# Patient Record
Sex: Female | Born: 1960 | Race: White | Hispanic: No | Marital: Single
Health system: Midwestern US, Community
[De-identification: ages and names within clinical notes are randomized; demographics above are authoritative.]

## PROBLEM LIST (undated history)

## (undated) DIAGNOSIS — Z1231 Encounter for screening mammogram for malignant neoplasm of breast: Secondary | ICD-10-CM

## (undated) DIAGNOSIS — R059 Cough, unspecified: Secondary | ICD-10-CM

## (undated) DIAGNOSIS — K5900 Constipation, unspecified: Secondary | ICD-10-CM

## (undated) DIAGNOSIS — R0789 Other chest pain: Secondary | ICD-10-CM

## (undated) DIAGNOSIS — R251 Tremor, unspecified: Secondary | ICD-10-CM

## (undated) DIAGNOSIS — K2950 Unspecified chronic gastritis without bleeding: Secondary | ICD-10-CM

---

## 2010-02-15 ENCOUNTER — Inpatient Hospital Stay: Admit: 2010-02-15 | Disposition: A | Discharge status: Home / Self Care | Attending: Psychiatry | Admitting: Psychiatry

## 2010-02-15 LAB — BASIC METABOLIC PANEL
Anion Gap: 9 mmol/L (ref 8–16)
BUN: 13 mg/dL (ref 6–20)
CO2: 28 mmol/L (ref 20–31)
Calcium: 9.1 mg/dL (ref 8.6–10.4)
Chloride: 107 mmol/L (ref 98–110)
Creatinine: 0.53 mg/dL (ref 0.4–1.0)
GFR African American: >60 mL/min (ref >60)
GFR Non-African American: >60 mL/min (ref >60)
Glucose: 129 mg/dL — ABNORMAL HIGH (ref 74–106)
Potassium: 4.3 mmol/L (ref 3.5–5.1)
Sodium: 140 mmol/L (ref 136–145)

## 2010-02-15 LAB — CBC WITH DIFFERENTIAL
Absolute Baso #: 0 10*3/uL (ref 0.0–0.2)
Absolute Eos #: 0.06 10*3/uL (ref 0.0–0.4)
Absolute Lymph #: 1.32 10*3/uL (ref 1.0–4.8)
Absolute Mono #: 0.72 10*3/uL (ref 0.1–1.3)
Absolute Neut #: 3.9 10*3/uL (ref 1.3–9.1)
Basophils: 0 % (ref 0–2)
Eosinophils %: 1 % (ref 0–4)
Hematocrit: 34.2 % — ABNORMAL LOW (ref 36–46)
Hemoglobin: 11.5 g/dL — ABNORMAL LOW (ref 12.0–16.0)
Lymphocytes: 22 % — ABNORMAL LOW (ref 24–44)
MCH: 28 pg (ref 26–34)
MCHC: 33.6 g/dL (ref 31–37)
MCV: 83.3 fL (ref 80–100)
MPV: 8.2 fL (ref 6.0–12.0)
Monocytes: 12 % — ABNORMAL HIGH (ref 1–7)
Platelet Count: 271 10*3/uL (ref 150–450)
RBC: 4.1 m/uL (ref 4.0–5.2)
RDW: 20.1 % — ABNORMAL HIGH (ref 11.5–14.9)
Seg Neutrophils: 65 % (ref 36–66)
WBC: 6 10*3/uL (ref 3.5–11.0)

## 2010-02-15 LAB — POC GLUCOSE FINGERSTICK
Glucose, Whole Blood: 142 mg/dL — ABNORMAL HIGH (ref 65–105)
Glucose, Whole Blood: 95 mg/dL (ref 65–105)
Glucose, Whole Blood: 133 mg/dL — ABNORMAL HIGH (ref 65–105)

## 2010-02-16 LAB — POC GLUCOSE FINGERSTICK
Glucose, Whole Blood: 142 mg/dL — ABNORMAL HIGH (ref 65–105)
Glucose, Whole Blood: 151 mg/dL — ABNORMAL HIGH (ref 65–105)
Glucose, Whole Blood: 123 mg/dL — ABNORMAL HIGH (ref 65–105)
Glucose, Whole Blood: 153 mg/dL — ABNORMAL HIGH (ref 65–105)

## 2010-02-17 LAB — POC GLUCOSE FINGERSTICK
Glucose, Whole Blood: 148 mg/dL — ABNORMAL HIGH (ref 65–105)
Glucose, Whole Blood: 128 mg/dL — ABNORMAL HIGH (ref 65–105)
Glucose, Whole Blood: 65 mg/dL (ref 65–105)
Glucose, Whole Blood: 160 mg/dL — ABNORMAL HIGH (ref 65–105)

## 2010-02-18 LAB — POC GLUCOSE FINGERSTICK
Glucose, Whole Blood: 143 mg/dL — ABNORMAL HIGH (ref 65–105)
Glucose, Whole Blood: 143 mg/dL — ABNORMAL HIGH (ref 65–105)
Glucose, Whole Blood: 95 mg/dL (ref 65–105)
Glucose, Whole Blood: 202 mg/dL — ABNORMAL HIGH (ref 65–105)

## 2010-02-19 LAB — POC GLUCOSE FINGERSTICK
Glucose, Whole Blood: 122 mg/dL — ABNORMAL HIGH (ref 65–105)
Glucose, Whole Blood: 120 mg/dL — ABNORMAL HIGH (ref 65–105)
Glucose, Whole Blood: 86 mg/dL (ref 65–105)

## 2010-02-20 LAB — POC GLUCOSE FINGERSTICK
Glucose, Whole Blood: 142 mg/dL — ABNORMAL HIGH (ref 65–105)
Glucose, Whole Blood: 153 mg/dL — ABNORMAL HIGH (ref 65–105)
Glucose, Whole Blood: 78 mg/dL (ref 65–105)
Glucose, Whole Blood: 159 mg/dL — ABNORMAL HIGH (ref 65–105)

## 2010-02-21 LAB — POC GLUCOSE FINGERSTICK
Glucose, Whole Blood: 112 mg/dL — ABNORMAL HIGH (ref 65–105)
Glucose, Whole Blood: 127 mg/dL — ABNORMAL HIGH (ref 65–105)
Glucose, Whole Blood: 56 mg/dL — ABNORMAL LOW (ref 65–105)
Glucose, Whole Blood: 124 mg/dL — ABNORMAL HIGH (ref 65–105)
Glucose, Whole Blood: 138 mg/dL — ABNORMAL HIGH (ref 65–105)

## 2010-02-22 LAB — POC GLUCOSE FINGERSTICK
Glucose, Whole Blood: 125 mg/dL — ABNORMAL HIGH (ref 65–105)
Glucose, Whole Blood: 153 mg/dL — ABNORMAL HIGH (ref 65–105)
Glucose, Whole Blood: 161 mg/dL — ABNORMAL HIGH (ref 65–105)

## 2010-02-23 LAB — POC GLUCOSE FINGERSTICK
Glucose, Whole Blood: 109 mg/dL — ABNORMAL HIGH (ref 65–105)
Glucose, Whole Blood: 80 mg/dL (ref 65–105)
Glucose, Whole Blood: 98 mg/dL (ref 65–105)
Glucose, Whole Blood: 58 mg/dL — ABNORMAL LOW (ref 65–105)
Glucose, Whole Blood: 112 mg/dL — ABNORMAL HIGH (ref 65–105)

## 2010-03-01 ENCOUNTER — Inpatient Hospital Stay: Admit: 2010-03-01 | Disposition: A | Discharge status: Home / Self Care | Attending: Psychiatry | Admitting: Psychiatry

## 2010-03-01 LAB — DRUGS OF ABUSE SCR, URINE
Amphetamine Screen, Ur: NEGATIVE
Barbiturate Screen, Ur: NEGATIVE
Benzodiazepines: NEGATIVE
Cannabinoid Scrn, Ur: NEGATIVE
Cocaine Metabolite, Urine: NEGATIVE
Methadone Screen, Urine: NEGATIVE
Opiates, Urine: NEGATIVE
Phencyclidine, Urine: NEGATIVE
Propoxyphene, Urine: NEGATIVE

## 2010-03-01 LAB — URINALYSIS
Bilirubin, Urine: NEGATIVE
Glucose, UA: NEGATIVE
Ketones, Urine: NEGATIVE
Leukocyte Esterase, Urine: NEGATIVE
Nitrite, Urine: NEGATIVE
Protein, UA: NEGATIVE
Specific Gravity, UA: 1.014 (ref 1.005–1.030)
Urine Hgb: NEGATIVE
Urobilinogen, Urine: NORMAL
pH, UA: 8 (ref 5.0–8.0)

## 2010-03-01 LAB — MICROSCOPIC URINALYSIS: WBC, UA: 0 /HPF (ref 0–5)

## 2010-03-01 LAB — CBC WITH DIFFERENTIAL
Absolute Baso #: 0.07 10*3/uL (ref 0.0–0.2)
Absolute Eos #: 0.07 10*3/uL (ref 0.0–0.4)
Absolute Lymph #: 1.05 10*3/uL (ref 1.0–4.8)
Absolute Mono #: 0.42 10*3/uL (ref 0.1–0.8)
Absolute Neut #: 5.39 10*3/uL (ref 1.8–7.7)
Basophils: 1 % (ref 0–2)
Eosinophils %: 1 % (ref 1–4)
Hematocrit: 32.9 % — ABNORMAL LOW (ref 36–46)
Hemoglobin: 10.9 g/dL — ABNORMAL LOW (ref 12.0–16.0)
Lymphocytes: 15 % — ABNORMAL LOW (ref 24–44)
MCH: 28 pg (ref 26–34)
MCHC: 33.1 g/dL (ref 31–37)
MCV: 84.5 fL (ref 80–100)
MPV: 8.3 fL (ref 6.0–12.0)
Monocytes: 6 % (ref 1–7)
Platelet Count: 295 10*3/uL (ref 140–450)
RBC: 3.89 m/uL — ABNORMAL LOW (ref 4.0–5.2)
RDW: 22 % — ABNORMAL HIGH (ref 12.5–15.4)
Seg Neutrophils: 77 % — ABNORMAL HIGH (ref 36–66)
WBC: 7 10*3/uL (ref 3.5–11.0)

## 2010-03-01 LAB — BASIC METABOLIC PANEL
Anion Gap: 11 mmol/L (ref 8–16)
BUN: 12 mg/dL (ref 6–20)
CO2: 27 mmol/L (ref 20–31)
Calcium: 8.9 mg/dL (ref 8.6–10.4)
Chloride: 105 mmol/L (ref 98–110)
Creatinine: 0.45 mg/dL (ref 0.4–1.0)
GFR African American: >60 mL/min (ref >60)
GFR Non-African American: >60 mL/min (ref >60)
Glucose: 131 mg/dL — ABNORMAL HIGH (ref 74–106)
Potassium: 4.4 mmol/L (ref 3.5–5.1)
Sodium: 139 mmol/L (ref 136–145)

## 2010-03-01 LAB — POC GLUCOSE FINGERSTICK
Glucose, Whole Blood: 155 mg/dL — ABNORMAL HIGH (ref 65–105)
Glucose, Whole Blood: 109 mg/dL — ABNORMAL HIGH (ref 65–105)
Glucose, Whole Blood: 195 mg/dL — ABNORMAL HIGH (ref 65–105)

## 2010-03-02 LAB — POC GLUCOSE FINGERSTICK
Glucose, Whole Blood: 102 mg/dL (ref 65–105)
Glucose, Whole Blood: 135 mg/dL — ABNORMAL HIGH (ref 65–105)
Glucose, Whole Blood: 94 mg/dL (ref 65–105)
Glucose, Whole Blood: 195 mg/dL — ABNORMAL HIGH (ref 65–105)

## 2010-03-03 LAB — POC GLUCOSE FINGERSTICK
Glucose, Whole Blood: 100 mg/dL (ref 65–105)
Glucose, Whole Blood: 105 mg/dL (ref 65–105)
Glucose, Whole Blood: 142 mg/dL — ABNORMAL HIGH (ref 65–105)
Glucose, Whole Blood: 129 mg/dL — ABNORMAL HIGH (ref 65–105)

## 2010-03-04 LAB — POC GLUCOSE FINGERSTICK
Glucose, Whole Blood: 125 mg/dL — ABNORMAL HIGH (ref 65–105)
Glucose, Whole Blood: 162 mg/dL — ABNORMAL HIGH (ref 65–105)
Glucose, Whole Blood: 85 mg/dL (ref 65–105)
Glucose, Whole Blood: 121 mg/dL — ABNORMAL HIGH (ref 65–105)

## 2010-03-05 LAB — POC GLUCOSE FINGERSTICK
Glucose, Whole Blood: 166 mg/dL — ABNORMAL HIGH (ref 65–105)
Glucose, Whole Blood: 152 mg/dL — ABNORMAL HIGH (ref 65–105)
Glucose, Whole Blood: 87 mg/dL (ref 65–105)
Glucose, Whole Blood: 158 mg/dL — ABNORMAL HIGH (ref 65–105)

## 2010-03-06 LAB — POC GLUCOSE FINGERSTICK
Glucose, Whole Blood: 130 mg/dL — ABNORMAL HIGH (ref 65–105)
Glucose, Whole Blood: 181 mg/dL — ABNORMAL HIGH (ref 65–105)
Glucose, Whole Blood: 96 mg/dL (ref 65–105)
Glucose, Whole Blood: 134 mg/dL — ABNORMAL HIGH (ref 65–105)

## 2010-03-07 LAB — POC GLUCOSE FINGERSTICK
Glucose, Whole Blood: 138 mg/dL — ABNORMAL HIGH (ref 65–105)
Glucose, Whole Blood: 107 mg/dL — ABNORMAL HIGH (ref 65–105)
Glucose, Whole Blood: 134 mg/dL — ABNORMAL HIGH (ref 65–105)

## 2010-03-08 LAB — POC GLUCOSE FINGERSTICK
Glucose, Whole Blood: 159 mg/dL — ABNORMAL HIGH (ref 65–105)
Glucose, Whole Blood: 139 mg/dL — ABNORMAL HIGH (ref 65–105)
Glucose, Whole Blood: 130 mg/dL — ABNORMAL HIGH (ref 65–105)
Glucose, Whole Blood: 84 mg/dL (ref 65–105)
Glucose, Whole Blood: 154 mg/dL — ABNORMAL HIGH (ref 65–105)

## 2010-03-09 LAB — POC GLUCOSE FINGERSTICK
Glucose, Whole Blood: 174 mg/dL — ABNORMAL HIGH (ref 65–105)
Glucose, Whole Blood: 135 mg/dL — ABNORMAL HIGH (ref 65–105)
Glucose, Whole Blood: 166 mg/dL — ABNORMAL HIGH (ref 65–105)

## 2010-03-10 LAB — POC GLUCOSE FINGERSTICK
Glucose, Whole Blood: 130 mg/dL — ABNORMAL HIGH (ref 65–105)
Glucose, Whole Blood: 151 mg/dL — ABNORMAL HIGH (ref 65–105)

## 2010-03-11 LAB — POC GLUCOSE FINGERSTICK
Glucose, Whole Blood: 149 mg/dL — ABNORMAL HIGH (ref 65–105)
Glucose, Whole Blood: 123 mg/dL — ABNORMAL HIGH (ref 65–105)

## 2010-03-12 LAB — POC GLUCOSE FINGERSTICK
Glucose, Whole Blood: 130 mg/dL — ABNORMAL HIGH (ref 65–105)
Glucose, Whole Blood: 164 mg/dL — ABNORMAL HIGH (ref 65–105)

## 2010-03-13 LAB — POC GLUCOSE FINGERSTICK
Glucose, Whole Blood: 125 mg/dL — ABNORMAL HIGH (ref 65–105)
Glucose, Whole Blood: 150 mg/dL — ABNORMAL HIGH (ref 65–105)

## 2010-03-14 LAB — POC GLUCOSE FINGERSTICK: Glucose, Whole Blood: 147 mg/dL — ABNORMAL HIGH (ref 65–105)

## 2010-03-15 LAB — POC GLUCOSE FINGERSTICK
Glucose, Whole Blood: 120 mg/dL — ABNORMAL HIGH (ref 65–105)
Glucose, Whole Blood: 146 mg/dL — ABNORMAL HIGH (ref 65–105)

## 2010-03-16 LAB — POC GLUCOSE FINGERSTICK
Glucose, Whole Blood: 129 mg/dL — ABNORMAL HIGH (ref 65–105)
Glucose, Whole Blood: 163 mg/dL — ABNORMAL HIGH (ref 65–105)

## 2010-03-17 LAB — POC GLUCOSE FINGERSTICK
Glucose, Whole Blood: 118 mg/dL — ABNORMAL HIGH (ref 65–105)
Glucose, Whole Blood: 148 mg/dL — ABNORMAL HIGH (ref 65–105)

## 2010-03-18 LAB — POC GLUCOSE FINGERSTICK: Glucose, Whole Blood: 130 mg/dL — ABNORMAL HIGH (ref 65–105)

## 2010-04-28 LAB — PROTIME-INR
INR: 1
Prothrombin Time: 10.4 s (ref 9.6–12.2)

## 2010-04-28 LAB — CHEST PAIN PNL (HR0)
% CKMB: 0.7 % (ref 0–4)
Absolute Baso #: 0 10*3/uL (ref 0.0–0.2)
Absolute Eos #: 0 10*3/uL (ref 0.0–0.4)
Absolute Lymph #: 0.79 10*3/uL — ABNORMAL LOW (ref 1.0–4.8)
Absolute Mono #: 0.88 10*3/uL — ABNORMAL HIGH (ref 0.1–0.8)
Absolute Neut #: 7.13 10*3/uL (ref 1.8–7.7)
Anion Gap: 7 mmol/L — ABNORMAL LOW (ref 8–16)
BUN: 22 mg/dL — ABNORMAL HIGH (ref 6–20)
Basophils: 0 % (ref 0–2)
CK-MB: 2.7 ng/mL (ref 0–5)
CO2: 29 mmol/L (ref 20–31)
Calcium: 9.2 mg/dL (ref 8.6–10.4)
Chloride: 109 mmol/L (ref 98–110)
Creatinine: 0.59 mg/dL (ref 0.4–1.0)
Eosinophils %: 0 % — ABNORMAL LOW (ref 1–4)
GFR African American: >60 mL/min (ref >60)
GFR Non-African American: >60 mL/min (ref >60)
Glucose: 121 mg/dL — ABNORMAL HIGH (ref 74–106)
Hematocrit: 37.4 % (ref 36–46)
Hemoglobin: 12.1 g/dL (ref 12.0–16.0)
Lymphocytes: 9 % — ABNORMAL LOW (ref 24–44)
MCH: 27 pg (ref 26–34)
MCHC: 32.3 g/dL (ref 31–37)
MCV: 83.9 fL (ref 80–100)
MPV: 8.9 fL (ref 6.0–12.0)
Monocytes: 10 % — ABNORMAL HIGH (ref 1–7)
Myoglobin: 34 ng/mL (ref 0–110)
Platelet Count: 299 10*3/uL (ref 140–450)
Potassium: 4.1 mmol/L (ref 3.5–5.1)
RBC: 4.45 m/uL (ref 4.0–5.2)
RDW: 21 % — ABNORMAL HIGH (ref 12.5–15.4)
Seg Neutrophils: 81 % — ABNORMAL HIGH (ref 36–66)
Sodium: 141 mmol/L (ref 136–145)
Total CK: 411 U/L — ABNORMAL HIGH (ref 26–140)
Troponin I: <0.01 ng/mL (ref 0.00–0.04)
WBC: 8.8 10*3/uL (ref 3.5–11.0)

## 2010-04-28 LAB — TOX SCR, BLD, ED
Acetaminophen Level: <10 ug/mL — ABNORMAL LOW (ref 10–30)
Ethanol percent: <0.01 %
Ethanol: <10 mg/dL (ref <10)
Salicylate Lvl: <1 mg/dL (ref <10)
Toxic Tricyclic Sc,Blood: NEGATIVE

## 2010-04-28 LAB — HEPATIC FUNCTION PANEL
ALT: 29 U/L (ref 4–40)
AST: 27 U/L (ref 8–36)
Albumin: 4.7 g/dL (ref 3.4–4.8)
Alkaline Phosphatase: 101 U/L — ABNORMAL HIGH (ref 25–100)
Bilirubin, Direct: 0.09 mg/dL (ref 0.0–0.3)
Bilirubin, Indirect: 0.15 mg/dL (ref 0.0–1.0)
Protein, Total: 7.3 g/dL (ref 6.4–8.3)
Total Bilirubin: 0.24 mg/dL — ABNORMAL LOW (ref 0.3–1.2)

## 2010-04-28 LAB — TSH: TSH: 0.81 mIU/L (ref 0.35–5.50)

## 2010-04-28 LAB — LIPID PANEL
Chol/HDL Ratio: 2.2 (ref <5.0)
Cholesterol: 113 mg/dL (ref <200)
HDL: 51 mg/dL (ref >40)
LDL Cholesterol: 49 mg/dL (ref <100)
Triglycerides: 64 mg/dL (ref <150)

## 2010-04-28 LAB — APTT: PTT: 25.4 s (ref 20.3–31.1)

## 2010-04-28 LAB — AMYLASE: Amylase: 22 U/L (ref 20–104)

## 2010-04-28 LAB — LIPASE: Lipase: 28 U/L (ref 5.6–51.3)

## 2010-04-29 LAB — URINALYSIS
Bilirubin, Urine: NEGATIVE
Glucose, UA: NEGATIVE
Ketones, Urine: NEGATIVE
Nitrite, Urine: NEGATIVE
Protein, UA: NEGATIVE
Specific Gravity, UA: 1.017 (ref 1.005–1.030)
Urine Hgb: NEGATIVE
Urobilinogen, Urine: NORMAL
pH, UA: 6.5 (ref 5.0–8.0)

## 2010-04-29 LAB — BASIC METABOLIC PANEL
Anion Gap: 7 mmol/L — ABNORMAL LOW (ref 8–16)
BUN: 21 mg/dL — ABNORMAL HIGH (ref 6–20)
CO2: 29 mmol/L (ref 20–31)
Calcium: 9.1 mg/dL (ref 8.6–10.4)
Chloride: 106 mmol/L (ref 98–110)
Creatinine: 0.46 mg/dL (ref 0.4–1.0)
GFR African American: >60 mL/min (ref >60)
GFR Non-African American: >60 mL/min (ref >60)
Glucose: 107 mg/dL — ABNORMAL HIGH (ref 74–106)
Potassium: 3.8 mmol/L (ref 3.5–5.1)
Sodium: 139 mmol/L (ref 136–145)

## 2010-04-29 LAB — CBC WITH DIFFERENTIAL
Basophils %: 0 % (ref 0–2)
Basophils Absolute: 0 10*3/uL (ref 0.0–0.2)
Eosinophils %: 0 % — ABNORMAL LOW (ref 1–4)
Eosinophils Absolute: 0 10*3/uL (ref 0.0–0.4)
Hematocrit: 34.8 % — ABNORMAL LOW (ref 36–46)
Hemoglobin: 11.4 g/dL — ABNORMAL LOW (ref 12.0–16.0)
Lymphocytes Absolute: 0.92 10*3/uL — ABNORMAL LOW (ref 1.0–4.8)
Lymphocytes: 15 % — ABNORMAL LOW (ref 24–44)
MCH: 27.4 pg (ref 26–34)
MCHC: 32.9 g/dL (ref 31–37)
MCV: 83.4 fL (ref 80–100)
MPV: 8.6 fL (ref 6.0–12.0)
Monocytes %: 12 % — ABNORMAL HIGH (ref 1–7)
Monocytes Absolute: 0.73 10*3/uL (ref 0.1–0.8)
Neutrophils Absolute: 4.45 10*3/uL (ref 1.8–7.7)
Platelet Count: 247 10*3/uL (ref 140–450)
RBC: 4.17 m/uL (ref 4.0–5.2)
RDW: 20.7 % — ABNORMAL HIGH (ref 12.5–15.4)
Seg Neutrophils: 73 % — ABNORMAL HIGH (ref 36–66)
WBC: 6.1 10*3/uL (ref 3.5–11.0)

## 2010-04-29 LAB — MICROSCOPIC URINALYSIS: WBC, UA: 10 /HPF (ref 0–5)

## 2010-04-29 LAB — DRUGS OF ABUSE SCR, URINE
Amphetamine Screen, Ur: NEGATIVE
Barbiturate Screen, Ur: NEGATIVE
Benzodiazepines: NEGATIVE
Cannabinoid Scrn, Ur: NEGATIVE
Cocaine Metabolite, Urine: NEGATIVE
Methadone Screen, Urine: NEGATIVE
Opiates, Urine: NEGATIVE
Phencyclidine, Urine: NEGATIVE
Propoxyphene, Urine: NEGATIVE

## 2010-04-29 LAB — MYOGLOBIN, SERUM
Myoglobin: 26 ng/mL (ref 0–110)
Myoglobin: 26 ng/mL (ref 0–110)

## 2010-04-29 LAB — CK ISOENZYMES
% CKMB: 0.5 % (ref 0–4)
% CKMB: 0.5 % (ref 0–4)
CK-MB: 1.4 ng/mL (ref 0–5)
CK-MB: 1 ng/mL (ref 0–5)
Total CK: 257 U/L — ABNORMAL HIGH (ref 26–140)
Total CK: 190 U/L — ABNORMAL HIGH (ref 26–140)

## 2010-04-29 LAB — TROPONIN
Troponin I: <0.01 ng/mL (ref 0.00–0.04)
Troponin I: <0.01 ng/mL (ref 0.00–0.04)
Troponin I: <0.01 ng/mL (ref 0.00–0.04)

## 2010-04-29 LAB — BRAIN NATRIURETIC PEPTIDE: BNP: <2 pg/mL (ref <100)

## 2010-04-29 LAB — HEMOGLOBIN A1C
Estimated Avg Glucose: 146 mg/dL
Hemoglobin A1C: 6.7 % — ABNORMAL HIGH (ref 4.0–6.0)

## 2010-04-29 LAB — POC GLUCOSE FINGERSTICK: Glucose, Whole Blood: 118 mg/dL — ABNORMAL HIGH (ref 65–105)

## 2010-04-30 LAB — BASIC METABOLIC PANEL
Anion Gap: 8 mmol/L (ref 8–16)
BUN: 15 mg/dL (ref 6–20)
CO2: 30 mmol/L (ref 20–31)
Calcium: 9.1 mg/dL (ref 8.6–10.4)
Chloride: 105 mmol/L (ref 98–110)
Creatinine: 0.53 mg/dL (ref 0.4–1.0)
GFR African American: >60 mL/min (ref >60)
GFR Non-African American: >60 mL/min (ref >60)
Glucose: 118 mg/dL — ABNORMAL HIGH (ref 74–106)
Potassium: 4.3 mmol/L (ref 3.5–5.1)
Sodium: 139 mmol/L (ref 136–145)

## 2010-04-30 LAB — CBC WITH DIFFERENTIAL
Basophils %: 1 % (ref 0–2)
Basophils Absolute: 0.07 10*3/uL (ref 0.0–0.2)
Eosinophils %: 1 % (ref 1–4)
Eosinophils Absolute: 0.07 10*3/uL (ref 0.0–0.4)
Hematocrit: 33.4 % — ABNORMAL LOW (ref 36–46)
Hemoglobin: 11 g/dL — ABNORMAL LOW (ref 12.0–16.0)
Lymphocytes Absolute: 0.92 10*3/uL — ABNORMAL LOW (ref 1.0–4.8)
Lymphocytes: 14 % — ABNORMAL LOW (ref 24–44)
MCH: 27.4 pg (ref 26–34)
MCHC: 32.9 g/dL (ref 31–37)
MCV: 83.3 fL (ref 80–100)
MPV: 8.5 fL (ref 6.0–12.0)
Monocytes %: 9 % — ABNORMAL HIGH (ref 1–7)
Monocytes Absolute: 0.59 10*3/uL (ref 0.1–0.8)
Neutrophils Absolute: 4.95 10*3/uL (ref 1.8–7.7)
Platelet Count: 284 10*3/uL (ref 140–450)
RBC: 4 m/uL (ref 4.0–5.2)
RDW: 20.6 % — ABNORMAL HIGH (ref 12.5–15.4)
Seg Neutrophils: 75 % — ABNORMAL HIGH (ref 36–66)
WBC: 6.6 10*3/uL (ref 3.5–11.0)

## 2010-04-30 LAB — URINE CULTURE CLEAN CATCH

## 2010-04-30 LAB — POC GLUCOSE FINGERSTICK
Glucose, Whole Blood: 110 mg/dL — ABNORMAL HIGH (ref 65–105)
Glucose, Whole Blood: 98 mg/dL (ref 65–105)

## 2010-05-01 LAB — CBC WITH DIFFERENTIAL
Absolute Baso #: 0 10*3/uL (ref 0.0–0.2)
Absolute Eos #: 0.1 10*3/uL (ref 0.0–0.4)
Absolute Lymph #: 1 10*3/uL (ref 1.0–4.8)
Absolute Mono #: 0.6 10*3/uL (ref 0.1–1.3)
Absolute Neut #: 4.8 10*3/uL (ref 1.3–9.1)
Basophils: 1 % (ref 0–2)
Eosinophils %: 1 % (ref 0–4)
Hematocrit: 36.8 % (ref 36–46)
Hemoglobin: 12.1 g/dL (ref 12.0–16.0)
Lymphocytes: 16 % — ABNORMAL LOW (ref 22–44)
MCH: 27.3 pg (ref 26–34)
MCHC: 32.8 g/dL (ref 31–37)
MCV: 83.1 fL (ref 80–100)
MPV: 8.7 fL (ref 6.0–12.0)
Monocytes: 9 % — ABNORMAL HIGH (ref 1–7)
Platelet Count: 260 10*3/uL (ref 150–450)
RBC: 4.43 m/uL (ref 4.0–5.2)
RDW: 19.4 % — ABNORMAL HIGH (ref 11.5–14.9)
Seg Neutrophils: 73 % — ABNORMAL HIGH (ref 36–66)
WBC: 6.5 10*3/uL (ref 3.5–11.0)

## 2010-05-01 LAB — BASIC METABOLIC PANEL
Anion Gap: 12 mmol/L (ref 8–16)
BUN: 20 mg/dL (ref 6–20)
CO2: 27 mmol/L (ref 20–31)
Calcium: 9.3 mg/dL (ref 8.6–10.4)
Chloride: 105 mmol/L (ref 98–110)
Creatinine: 0.46 mg/dL (ref 0.4–1.0)
GFR African American: >60 mL/min (ref >60)
GFR Non-African American: >60 mL/min (ref >60)
Glucose: 109 mg/dL — ABNORMAL HIGH (ref 74–106)
Potassium: 3.8 mmol/L (ref 3.5–5.1)
Sodium: 140 mmol/L (ref 136–145)

## 2010-05-01 LAB — POC GLUCOSE FINGERSTICK
Glucose, Whole Blood: 125 mg/dL — ABNORMAL HIGH (ref 65–105)
Glucose, Whole Blood: 126 mg/dL — ABNORMAL HIGH (ref 65–105)
Glucose, Whole Blood: 153 mg/dL — ABNORMAL HIGH (ref 65–105)

## 2010-05-02 LAB — POC GLUCOSE FINGERSTICK
Glucose, Whole Blood: 145 mg/dL — ABNORMAL HIGH (ref 65–105)
Glucose, Whole Blood: 136 mg/dL — ABNORMAL HIGH (ref 65–105)

## 2010-05-03 LAB — POC GLUCOSE FINGERSTICK
Glucose, Whole Blood: 117 mg/dL — ABNORMAL HIGH (ref 65–105)
Glucose, Whole Blood: 140 mg/dL — ABNORMAL HIGH (ref 65–105)
Glucose, Whole Blood: 87 mg/dL (ref 65–105)
Glucose, Whole Blood: 149 mg/dL — ABNORMAL HIGH (ref 65–105)

## 2010-05-04 LAB — POC GLUCOSE FINGERSTICK
Glucose, Whole Blood: 115 mg/dL — ABNORMAL HIGH (ref 65–105)
Glucose, Whole Blood: 101 mg/dL (ref 65–105)
Glucose, Whole Blood: 159 mg/dL — ABNORMAL HIGH (ref 65–105)

## 2010-05-05 LAB — POC GLUCOSE FINGERSTICK
Glucose, Whole Blood: 134 mg/dL — ABNORMAL HIGH (ref 65–105)
Glucose, Whole Blood: 114 mg/dL — ABNORMAL HIGH (ref 65–105)
Glucose, Whole Blood: 109 mg/dL — ABNORMAL HIGH (ref 65–105)
Glucose, Whole Blood: 123 mg/dL — ABNORMAL HIGH (ref 65–105)

## 2010-05-06 LAB — POC GLUCOSE FINGERSTICK
Glucose, Whole Blood: 104 mg/dL (ref 65–105)
Glucose, Whole Blood: 128 mg/dL — ABNORMAL HIGH (ref 65–105)
Glucose, Whole Blood: 107 mg/dL — ABNORMAL HIGH (ref 65–105)
Glucose, Whole Blood: 150 mg/dL — ABNORMAL HIGH (ref 65–105)

## 2010-05-07 LAB — POC GLUCOSE FINGERSTICK
Glucose, Whole Blood: 120 mg/dL — ABNORMAL HIGH (ref 65–105)
Glucose, Whole Blood: 118 mg/dL — ABNORMAL HIGH (ref 65–105)
Glucose, Whole Blood: 104 mg/dL (ref 65–105)
Glucose, Whole Blood: 188 mg/dL — ABNORMAL HIGH (ref 65–105)

## 2010-05-08 LAB — POC GLUCOSE FINGERSTICK
Glucose, Whole Blood: 93 mg/dL (ref 65–105)
Glucose, Whole Blood: 117 mg/dL — ABNORMAL HIGH (ref 65–105)
Glucose, Whole Blood: 80 mg/dL (ref 65–105)
Glucose, Whole Blood: 127 mg/dL — ABNORMAL HIGH (ref 65–105)

## 2010-05-09 LAB — POC GLUCOSE FINGERSTICK
Glucose, Whole Blood: 281 mg/dL — ABNORMAL HIGH (ref 65–105)
Glucose, Whole Blood: 113 mg/dL — ABNORMAL HIGH (ref 65–105)
Glucose, Whole Blood: 70 mg/dL (ref 65–105)
Glucose, Whole Blood: 97 mg/dL (ref 65–105)

## 2010-05-10 LAB — POC GLUCOSE FINGERSTICK
Glucose, Whole Blood: 94 mg/dL (ref 65–105)
Glucose, Whole Blood: 167 mg/dL — ABNORMAL HIGH (ref 65–105)
Glucose, Whole Blood: 92 mg/dL (ref 65–105)
Glucose, Whole Blood: 156 mg/dL — ABNORMAL HIGH (ref 65–105)

## 2010-05-11 LAB — POC GLUCOSE FINGERSTICK
Glucose, Whole Blood: 124 mg/dL — ABNORMAL HIGH (ref 65–105)
Glucose, Whole Blood: 108 mg/dL — ABNORMAL HIGH (ref 65–105)
Glucose, Whole Blood: 126 mg/dL — ABNORMAL HIGH (ref 65–105)
Glucose, Whole Blood: 133 mg/dL — ABNORMAL HIGH (ref 65–105)

## 2010-05-12 LAB — POC GLUCOSE FINGERSTICK
Glucose, Whole Blood: 128 mg/dL — ABNORMAL HIGH (ref 65–105)
Glucose, Whole Blood: 173 mg/dL — ABNORMAL HIGH (ref 65–105)
Glucose, Whole Blood: 86 mg/dL (ref 65–105)
Glucose, Whole Blood: 115 mg/dL — ABNORMAL HIGH (ref 65–105)

## 2010-05-13 LAB — POC GLUCOSE FINGERSTICK
Glucose, Whole Blood: 108 mg/dL — ABNORMAL HIGH (ref 65–105)
Glucose, Whole Blood: 159 mg/dL — ABNORMAL HIGH (ref 65–105)
Glucose, Whole Blood: 82 mg/dL (ref 65–105)
Glucose, Whole Blood: 166 mg/dL — ABNORMAL HIGH (ref 65–105)

## 2010-05-14 LAB — POC GLUCOSE FINGERSTICK
Glucose, Whole Blood: 140 mg/dL — ABNORMAL HIGH (ref 65–105)
Glucose, Whole Blood: 146 mg/dL — ABNORMAL HIGH (ref 65–105)
Glucose, Whole Blood: 120 mg/dL — ABNORMAL HIGH (ref 65–105)
Glucose, Whole Blood: 119 mg/dL — ABNORMAL HIGH (ref 65–105)

## 2010-05-15 LAB — POC GLUCOSE FINGERSTICK
Glucose, Whole Blood: 142 mg/dL — ABNORMAL HIGH (ref 65–105)
Glucose, Whole Blood: 101 mg/dL (ref 65–105)
Glucose, Whole Blood: 101 mg/dL (ref 65–105)

## 2010-05-16 LAB — POC GLUCOSE FINGERSTICK: Glucose, Whole Blood: 136 mg/dL — ABNORMAL HIGH (ref 65–105)

## 2010-05-17 LAB — POC GLUCOSE FINGERSTICK: Glucose, Whole Blood: 121 mg/dL — ABNORMAL HIGH (ref 65–105)

## 2010-05-18 LAB — POC GLUCOSE FINGERSTICK: Glucose, Whole Blood: 139 mg/dL — ABNORMAL HIGH (ref 65–105)

## 2010-05-19 LAB — POC GLUCOSE FINGERSTICK: Glucose, Whole Blood: 150 mg/dL — ABNORMAL HIGH (ref 65–105)

## 2010-05-20 LAB — POC GLUCOSE FINGERSTICK: Glucose, Whole Blood: 118 mg/dL — ABNORMAL HIGH (ref 65–105)

## 2010-05-21 LAB — POC GLUCOSE FINGERSTICK: Glucose, Whole Blood: 123 mg/dL — ABNORMAL HIGH (ref 65–105)

## 2010-05-22 LAB — POC GLUCOSE FINGERSTICK: Glucose, Whole Blood: 156 mg/dL — ABNORMAL HIGH (ref 65–105)

## 2010-05-23 LAB — POC GLUCOSE FINGERSTICK: Glucose, Whole Blood: 144 mg/dL — ABNORMAL HIGH (ref 65–105)

## 2010-05-24 LAB — POC GLUCOSE FINGERSTICK: Glucose, Whole Blood: 191 mg/dL — ABNORMAL HIGH (ref 65–105)

## 2010-05-25 LAB — CBC
Hematocrit: 33 % — ABNORMAL LOW (ref 36–46)
Hemoglobin: 11 g/dL — ABNORMAL LOW (ref 12.0–16.0)
MCH: 27.4 pg (ref 26–34)
MCHC: 33.3 g/dL (ref 31–37)
MCV: 82.3 fL (ref 80–100)
MPV: 8.2 fL (ref 6.0–12.0)
Platelet Count: 239 10*3/uL (ref 150–450)
RBC: 4.01 m/uL (ref 4.0–5.2)
RDW: 18.9 % — ABNORMAL HIGH (ref 11.5–14.9)
WBC: 5 10*3/uL (ref 3.5–11.0)

## 2010-05-25 LAB — BASIC METABOLIC PANEL
Anion Gap: 9 mmol/L (ref 8–16)
BUN: 16 mg/dL (ref 6–20)
CO2: 32 mmol/L — ABNORMAL HIGH (ref 20–31)
Calcium: 9 mg/dL (ref 8.6–10.4)
Chloride: 101 mmol/L (ref 98–110)
Creatinine: 0.46 mg/dL (ref 0.4–1.0)
GFR African American: >60 mL/min (ref >60)
GFR Non-African American: >60 mL/min (ref >60)
Glucose: 105 mg/dL (ref 74–106)
Potassium: 4.1 mmol/L (ref 3.5–5.1)
Sodium: 138 mmol/L (ref 136–145)

## 2010-05-25 LAB — HEPATIC FUNCTION PANEL
ALT: 77 U/L — ABNORMAL HIGH (ref 4–40)
AST: 41 U/L — ABNORMAL HIGH (ref 8–36)
Albumin: 3.8 g/dL (ref 3.4–4.8)
Alkaline Phosphatase: 79 U/L (ref 25–100)
Bilirubin, Direct: 0.06 mg/dL (ref 0.0–0.3)
Bilirubin, Indirect: 0.2 mg/dL
Protein, Total: 6.4 g/dL (ref 6.4–8.3)
Total Bilirubin: 0.26 mg/dL — ABNORMAL LOW (ref 0.3–1.2)

## 2010-05-25 LAB — T4, FREE: Thyroxine, Free: 0.88 ng/dL (ref 0.80–2.00)

## 2010-05-25 LAB — T4: T4, Total: 7.2 ug/dL (ref 4.5–10.9)

## 2010-05-25 LAB — POC GLUCOSE FINGERSTICK: Glucose, Whole Blood: 124 mg/dL — ABNORMAL HIGH (ref 65–105)

## 2010-05-25 LAB — HEMOGLOBIN A1C
Estimated Avg Glucose: 146 mg/dL
Hemoglobin A1C: 6.7 % — ABNORMAL HIGH (ref 4.0–6.0)

## 2010-05-25 LAB — BRAIN NATRIURETIC PEPTIDE: BNP: <2 pg/mL (ref <100)

## 2010-05-25 LAB — MAGNESIUM: Magnesium: 1.9 mg/dL (ref 1.5–2.5)

## 2010-05-25 LAB — T3: T3, Total: 96 ng/dL (ref 60–181)

## 2010-05-25 LAB — T3, FREE: T3, Free: 2.54 pg/mL (ref 2.2–4.0)

## 2010-05-25 LAB — TSH: TSH: 1.91 mIU/L (ref 0.35–5.50)

## 2010-05-26 LAB — POC GLUCOSE FINGERSTICK: Glucose, Whole Blood: 122 mg/dL — ABNORMAL HIGH (ref 65–105)

## 2010-05-27 LAB — BASIC METABOLIC PANEL
Anion Gap: 11 mmol/L (ref 8–16)
BUN: 17 mg/dL (ref 6–20)
CO2: 35 mmol/L — ABNORMAL HIGH (ref 20–31)
Calcium: 9.2 mg/dL (ref 8.6–10.4)
Chloride: 98 mmol/L (ref 98–110)
Creatinine: 0.49 mg/dL (ref 0.4–1.0)
GFR African American: >60 mL/min (ref >60)
GFR Non-African American: >60 mL/min (ref >60)
Glucose: 118 mg/dL — ABNORMAL HIGH (ref 74–106)
Potassium: 4 mmol/L (ref 3.5–5.1)
Sodium: 140 mmol/L (ref 136–145)

## 2010-05-27 LAB — POC GLUCOSE FINGERSTICK: Glucose, Whole Blood: 154 mg/dL — ABNORMAL HIGH (ref 65–105)

## 2010-05-28 LAB — T3, REVERSE: T3, Reverse: 170 pg/mL (ref 90–350)

## 2010-05-28 LAB — POC GLUCOSE FINGERSTICK: Glucose, Whole Blood: 116 mg/dL — ABNORMAL HIGH (ref 65–105)

## 2010-05-29 LAB — POC GLUCOSE FINGERSTICK: Glucose, Whole Blood: 144 mg/dL — ABNORMAL HIGH (ref 65–105)

## 2010-05-30 LAB — POC GLUCOSE FINGERSTICK: Glucose, Whole Blood: 148 mg/dL — ABNORMAL HIGH (ref 65–105)

## 2010-05-31 LAB — POC GLUCOSE FINGERSTICK: Glucose, Whole Blood: 174 mg/dL — ABNORMAL HIGH (ref 65–105)

## 2010-06-01 LAB — POC GLUCOSE FINGERSTICK: Glucose, Whole Blood: 149 mg/dL — ABNORMAL HIGH (ref 65–105)

## 2010-06-02 LAB — POC GLUCOSE FINGERSTICK: Glucose, Whole Blood: 130 mg/dL — ABNORMAL HIGH (ref 65–105)

## 2010-06-03 LAB — POC GLUCOSE FINGERSTICK: Glucose, Whole Blood: 133 mg/dL — ABNORMAL HIGH (ref 65–105)

## 2013-02-13 LAB — BASIC METABOLIC PANEL
Anion Gap: 14 mmol/L (ref 8–16)
BUN: 10 mg/dL (ref 6–20)
CO2: 28 mmol/L (ref 20–31)
Calcium: 9.4 mg/dL (ref 8.6–10.4)
Chloride: 101 mmol/L (ref 98–107)
Creatinine: 0.48 mg/dL — ABNORMAL LOW (ref 0.50–0.90)
GFR African American: >60 mL/min (ref >60)
GFR Non-African American: >60 mL/min (ref >60)
Glucose: 88 mg/dL (ref 70–99)
Potassium: 4 mmol/L (ref 3.5–5.1)
Sodium: 139 mmol/L (ref 133–142)

## 2013-02-13 LAB — CBC WITH AUTO DIFFERENTIAL
Absolute Eos #: 0 10*3/uL (ref 0.0–0.4)
Absolute Lymph #: 1.1 10*3/uL (ref 1.0–4.8)
Absolute Mono #: 0.5 10*3/uL (ref 0.1–1.2)
Basophils Absolute: 0 10*3/uL (ref 0.0–0.2)
Basophils: 0 % (ref 0–2)
Eosinophils %: 0 % — ABNORMAL LOW (ref 1–4)
Hematocrit: 40.1 % (ref 36–46)
Hemoglobin: 13.2 g/dL (ref 12.0–16.0)
Lymphocytes: 18 % — ABNORMAL LOW (ref 24–44)
MCH: 29.1 pg (ref 26–34)
MCHC: 32.9 g/dL (ref 31–37)
MCV: 88.4 fL (ref 80–100)
MPV: 8.6 fL (ref 6.0–12.0)
Monocytes: 8 % (ref 2–11)
Platelets: 255 10*3/uL (ref 140–450)
RBC: 4.53 m/uL (ref 4.0–5.2)
RDW: 13.8 % (ref 12.5–15.4)
Seg Neutrophils: 74 % — ABNORMAL HIGH (ref 36–66)
Segs Absolute: 4.6 10*3/uL (ref 1.8–7.7)
WBC: 6.3 10*3/uL (ref 3.5–11.0)

## 2013-02-13 LAB — HEPATIC FUNCTION PANEL
ALT: 17 U/L (ref 5–33)
AST: 13 U/L (ref <32)
Albumin/Globulin Ratio: 1.4 (ref 1.0–2.5)
Albumin: 4.1 g/dL (ref 3.5–5.2)
Alkaline Phosphatase: 82 U/L (ref 35–104)
Bilirubin, Direct: <0.2 mg/dL (ref <0.31)
Total Bilirubin: 0.37 mg/dL (ref 0.3–1.2)
Total Protein: 7 g/dL (ref 6.4–8.3)

## 2013-02-13 LAB — LIPID PANEL
Chol/HDL Ratio: 2.8 (ref <5)
Cholesterol: 147 mg/dL (ref <200)
HDL: 52 mg/dL (ref >40)
LDL Cholesterol: 69 mg/dL (ref 0–130)
Triglycerides: 129 mg/dL (ref <150)

## 2013-02-13 LAB — T3, FREE: T3, Free: 2.84 pg/mL (ref 2.02–4.43)

## 2013-02-13 LAB — MICROALBUMIN, UR
Creatinine, Ur: 130.2 mg/dL (ref 39.0–259.0)
Microalb, Ur: <12 mg/L (ref <21)
Microalb/Crt. Ratio: 9 mcg/mg creat

## 2013-02-13 LAB — RHEUMATOID FACTOR: Rheumatoid Factor: <10 (ref <14)

## 2013-02-13 LAB — CK: Total CK: 58 U/L (ref 26–192)

## 2013-02-13 LAB — TSH: TSH: 1.02 mIU/L (ref 0.30–5.00)

## 2013-02-13 LAB — URIC ACID: Uric Acid: 4 mg/dL (ref 2.4–5.7)

## 2013-02-13 LAB — HEMOGLOBIN A1C
Estimated Avg Glucose: 123 mg/dL
Hemoglobin A1C: 5.9 % (ref 4.0–6.0)

## 2013-02-13 LAB — T4, FREE: Thyroxine, Free: 1.1 ng/dL (ref 0.93–1.70)

## 2013-02-13 LAB — ANA: ANA: NEGATIVE

## 2013-06-24 LAB — LIPID PANEL
Chol/HDL Ratio: 2.3 (ref <5)
Cholesterol: 127 mg/dL (ref <200)
HDL: 55 mg/dL (ref >40)
LDL Cholesterol: 49 mg/dL (ref 0–130)
Triglycerides: 113 mg/dL (ref <150)

## 2013-06-24 LAB — HEPATIC FUNCTION PANEL
ALT: 14 U/L (ref 5–33)
AST: 12 U/L (ref <32)
Albumin/Globulin Ratio: 1.5 (ref 1.0–2.5)
Albumin: 4.5 g/dL (ref 3.5–5.2)
Alkaline Phosphatase: 84 U/L (ref 35–104)
Bilirubin, Direct: <0.08 mg/dL (ref <0.31)
Total Bilirubin: 0.28 mg/dL — ABNORMAL LOW (ref 0.3–1.2)
Total Protein: 7.6 g/dL (ref 6.4–8.3)

## 2013-06-24 LAB — CBC WITH AUTO DIFFERENTIAL
Absolute Eos #: 0 10*3/uL (ref 0.0–0.4)
Absolute Lymph #: 1.5 10*3/uL (ref 1.0–4.8)
Absolute Mono #: 0.5 10*3/uL (ref 0.1–1.2)
Basophils Absolute: 0 10*3/uL (ref 0.0–0.2)
Basophils: 0 % (ref 0–2)
Eosinophils %: 0 % — ABNORMAL LOW (ref 1–4)
Hematocrit: 41.1 % (ref 36–46)
Hemoglobin: 13.8 g/dL (ref 12.0–16.0)
Lymphocytes: 27 % (ref 24–44)
MCH: 30 pg (ref 26–34)
MCHC: 33.5 g/dL (ref 31–37)
MCV: 89.7 fL (ref 80–100)
MPV: 8.4 fL (ref 6.0–12.0)
Monocytes: 10 % (ref 2–11)
Platelets: 279 10*3/uL (ref 140–450)
RBC: 4.58 m/uL (ref 4.0–5.2)
RDW: 14 % (ref 12.5–15.4)
Seg Neutrophils: 63 % (ref 36–66)
Segs Absolute: 3.6 10*3/uL (ref 1.8–7.7)
WBC: 5.6 10*3/uL (ref 3.5–11.0)

## 2013-06-24 LAB — HEMOGLOBIN A1C
Estimated Avg Glucose: 126 mg/dL
Hemoglobin A1C: 6 % (ref 4.0–6.0)

## 2013-06-24 LAB — BASIC METABOLIC PANEL
Anion Gap: 18 mmol/L — ABNORMAL HIGH (ref 8–16)
BUN: 12 mg/dL (ref 6–20)
CO2: 28 mmol/L (ref 20–31)
Calcium: 9.3 mg/dL (ref 8.6–10.4)
Chloride: 98 mmol/L (ref 98–107)
Creatinine: 0.58 mg/dL (ref 0.50–0.90)
GFR African American: >60 mL/min (ref >60)
GFR Non-African American: >60 mL/min (ref >60)
Glucose: 92 mg/dL (ref 70–99)
Potassium: 4.2 mmol/L (ref 3.7–5.3)
Sodium: 140 mmol/L (ref 135–144)

## 2013-06-24 LAB — TSH: TSH: 1.45 mIU/L (ref 0.30–5.00)

## 2013-06-24 LAB — VITAMIN D 25 HYDROXY: Vit D, 25-Hydroxy: 20.4 ng/mL — ABNORMAL LOW (ref 30.0–100.0)

## 2013-06-24 LAB — T3, FREE: T3, Free: 2.78 pg/mL (ref 2.02–4.43)

## 2013-06-24 LAB — T4, FREE: Thyroxine, Free: 1.09 ng/dL (ref 0.93–1.70)

## 2013-06-24 LAB — MICROALBUMIN, UR
Creatinine, Ur: 173.3 mg/dL (ref 39.0–259.0)
Microalb, Ur: 18 mg/L (ref <21)
Microalb/Crt. Ratio: 10 mcg/mg creat

## 2013-06-24 LAB — CK: Total CK: 64 U/L (ref 26–192)

## 2013-07-05 ENCOUNTER — Inpatient Hospital Stay
Admit: 2013-07-05 | Discharge: 2013-07-05 | Disposition: A | Discharge status: Home / Self Care | Attending: Emergency Medicine

## 2013-07-05 MED ADMIN — azithromycin (ZITHROMAX) tablet 500 mg: 500 mg | ORAL | @ 18:00:00 | NDC 59762306002

## 2013-07-05 NOTE — ED Provider Notes (Signed)
[facility]  eMERGENCY dEPARTMENT eNCOUnter  Resident    Pt Name: Shannon Allison  MRN: [id number]  Birthdate 04/10/1960  Date of evaluation: 07/05/2013  PCP:  Ronnald RampKhalida Durrani, MD    CHIEF COMPLAINT       Chief Complaint   Patient presents with   ??? URI     pt with c/o nasal congestion, sore throat, and productive cough. pt afebrile.          HISTORY OF PRESENT ILLNESS    Shannon Allison is a 53 y.o. female who presents with productive cough for the past 3 days.  Patient states that she has had cough with green sputum as well as green drainage from her nose.  Patient states that she feels congested in her sinuses as well as her ears.  Patient denies fevers or chills.  Patient denies abdominal pain, nausea, vomiting, neck pain, headache, visual disturbance.  Patient denies diarrhea or urinary complaints.  Patient states she does have a history of asthma as well as COPD.  Patient states that she lives in a group home and which many of the residents of similar symptoms.       REVIEW OF SYSTEMS         Review of Systems   Constitutional: Negative for fever, chills, activity change and appetite change.   HENT: Positive for congestion, rhinorrhea, sinus pressure, sneezing, sore throat and voice change. Negative for trouble swallowing.    Respiratory: Positive for cough. Negative for choking, chest tightness and shortness of breath.    Cardiovascular: Negative for chest pain and palpitations.   Gastrointestinal: Negative for nausea, vomiting, abdominal pain, diarrhea and constipation.   Genitourinary: Negative for dysuria.   Musculoskeletal: Negative for neck pain and neck stiffness.   Skin: Negative for rash and wound.   Neurological: Negative for dizziness, tremors, seizures, syncope, weakness, light-headedness, numbness and headaches.   Psychiatric/Behavioral: Negative for confusion and decreased concentration.          PAST MEDICAL HISTORY    has a past medical history of Asthma.      SURGICAL HISTORY       has no past surgical history on file.      CURRENT MEDICATIONS       Previous Medications    No medications on file       ALLERGIES     has No Known Allergies.    FAMILY HISTORY     has no family status information on file.    family history is not on file.      SOCIAL HISTORY      reports that she has never smoked. She does not have any smokeless tobacco history on file. She reports that she does not drink alcohol or use illicit drugs.    PHYSICAL EXAM     INITIAL VITALS:  weight is 195 lb (88.451 kg). Her oral temperature is 98.2 ??F (36.8 ??C). Her blood pressure is 134/87 and her pulse is 95. Her respiration is 18 and oxygen saturation is 95%.        Physical Exam   Constitutional: She is oriented to person, place, and time. She appears well-developed and well-nourished. No distress.   HENT:   Head: Normocephalic and atraumatic.   Right Ear: External ear normal.   Left Ear: External ear normal.   Nose: Mucosal edema and rhinorrhea present. No nasal deformity, septal deviation or nasal septal hematoma.   Mouth/Throat: Mucous membranes are not pale and  not dry. Posterior oropharyngeal erythema present. No oropharyngeal exudate, posterior oropharyngeal edema or tonsillar abscesses.   Eyes: Conjunctivae and EOM are normal. Pupils are equal, round, and reactive to light. Right eye exhibits no discharge. Left eye exhibits no discharge. No scleral icterus.   Neck: Normal range of motion. Neck supple. No JVD present. No tracheal deviation present.   Cardiovascular: Normal rate and regular rhythm.  Exam reveals no gallop and no friction rub.    No murmur heard.  Pulmonary/Chest: Effort normal and breath sounds normal. No respiratory distress. She has no wheezes. She has no rales.   Abdominal: Soft. Bowel sounds are normal. There is no tenderness. There is no rebound and no guarding.   Musculoskeletal: Normal range of motion. She exhibits no edema or tenderness.   Neurological: She is alert and oriented to person, place,  and time.   Skin: Skin is warm and dry. No rash noted. She is not diaphoretic.   Psychiatric: She has a normal mood and affect. Her behavior is normal. Judgment and thought content normal.   Nursing note and vitals reviewed.        DIFFERENTIAL DIAGNOSIS/MDM:   URI  History of asthma and probably COPD  Doubt pneumonia at this time  Probable viral syndrome, however, with history of COPD we will treat with antibiotics for Infection of bacterial etiologies        DIAGNOSTIC RESULTS     EKG: All EKG's are interpreted by the Emergency Department Physician who either signs or Co-signs this chart in the absence of a cardiologist.        RADIOLOGY:   I directly visualized the following  images and reviewed the radiologist interpretations:           ED BEDSIDE ULTRASOUND:       LABS:  Labs Reviewed - No data to display          EMERGENCY DEPARTMENT COURSE:   Vitals:    Filed Vitals:    07/05/13 1237   BP: 134/87   Pulse: 95   Temp: 98.2 ??F (36.8 ??C)   TempSrc: Oral   Resp: 18   Weight: 195 lb (88.451 kg)   SpO2: 95%     Plan for discharge at this time.  Patient's remaining Z-Pak has been called into her pharmacy.  This will be delivered another today or tomorrow per the pharmacist.    CRITICAL CARE:      CONSULTS:  None      PROCEDURES:      FINAL IMPRESSION      1. Acute exacerbation of chronic obstructive bronchitis (HCC)            DISPOSITION/PLAN   DISPOSITION     discharge    PATIENT REFERRED TO:  Ronnald Ramp, MD  [address]  [address]  [place] [address]  [phone]      If symptoms worsen return to ED      DISCHARGE MEDICATIONS:  New Prescriptions    No medications on file       (Please note that portions of this note were completed with a voice recognition program.  Efforts were made to edit the dictations but occasionally words are mis-transcribed.)    Laverle Hobby  Emergency Medicine Resident              Laverle Hobby, DO  Resident  07/05/13 [phone]

## 2013-07-05 NOTE — ED Provider Notes (Signed)
[facility]     Emergency Department     Faculty Attestation    I performed a history and physical examination of the patient and discussed management with the resident. I reviewed the resident???s note and agree with the documented findings and plan of care. Any areas of disagreement are noted on the chart. I was personally present for the key portions of any procedures. I have documented in the chart those procedures where I was not present during the key portions. I have reviewed the emergency nurses triage note. I agree with the chief complaint, past medical history, past surgical history, allergies, medications, social and family history as documented unless otherwise noted below. Documentation of the HPI, Physical Exam and Medical Decision Making performed by medical students or scribes is based on my personal performance of the HPI, PE and MDM. For Physician Assistant/ Nurse Practitioner cases/documentation I have personally evaluated this patient and have completed at least one if not all key elements of the E/M (history, physical exam, and MDM). Additional findings are as noted.    COPD exacerbation, productive cough, does improve with albuterol.  No chest pain,no fevers.  Well appearing.  Lungs clear, no wheezing, no retractions.  Given change in chronic cough, will rx abx.      Critical Care     none    Christian Mate.  Pauline Aus MD  Attending Emergency  Physician            Teryl Lucy, MD  07/05/13 [phone]

## 2014-07-23 LAB — URINE CULTURE CLEAN CATCH: Culture: NO GROWTH

## 2014-07-23 LAB — MICROALBUMIN, UR
Creatinine, Ur: 42.7 mg/dL (ref 28.0–217.0)
Microalb, Ur: <12 mg/L
Microalb/Crt. Ratio: 28 mcg/mg creat — ABNORMAL HIGH (ref <25)

## 2015-04-19 LAB — TSH: TSH: 1.75 mIU/L (ref 0.30–5.00)

## 2015-04-19 LAB — CBC WITH AUTO DIFFERENTIAL
Absolute Eos #: 0 10*3/uL (ref 0.0–0.4)
Absolute Lymph #: 1.2 10*3/uL (ref 1.0–4.8)
Absolute Mono #: 0.7 10*3/uL (ref 0.1–1.2)
Basophils Absolute: 0.1 10*3/uL (ref 0.0–0.2)
Basophils: 1 % (ref 0–2)
Eosinophils %: 0 % — ABNORMAL LOW (ref 1–4)
Hematocrit: 41.6 % (ref 36–46)
Hemoglobin: 13.8 g/dL (ref 12.0–16.0)
Lymphocytes: 12 % — ABNORMAL LOW (ref 24–44)
MCH: 29.2 pg (ref 26–34)
MCHC: 33.3 g/dL (ref 31–37)
MCV: 87.8 fL (ref 80–100)
MPV: 8.3 fL (ref 6.0–12.0)
Monocytes: 8 % (ref 2–11)
Platelets: 272 10*3/uL (ref 140–450)
RBC: 4.74 m/uL (ref 4.0–5.2)
RDW: 14 % (ref 12.5–15.4)
Seg Neutrophils: 79 % — ABNORMAL HIGH (ref 36–66)
Segs Absolute: 7.4 10*3/uL (ref 1.8–7.7)
WBC: 9.4 10*3/uL (ref 3.5–11.0)

## 2015-04-19 LAB — HEPATIC FUNCTION PANEL
ALT: 24 U/L (ref 5–33)
AST: 15 U/L (ref <32)
Albumin/Globulin Ratio: 1.2 (ref 1.0–2.5)
Albumin: 4.1 g/dL (ref 3.5–5.2)
Alkaline Phosphatase: 103 U/L (ref 35–104)
Bilirubin, Direct: 0.09 mg/dL (ref <0.31)
Bilirubin, Indirect: 0.27 mg/dL (ref 0.00–1.00)
Total Bilirubin: 0.36 mg/dL (ref 0.3–1.2)
Total Protein: 7.4 g/dL (ref 6.4–8.3)

## 2015-04-19 LAB — HEMOGLOBIN A1C
Estimated Avg Glucose: 126 mg/dL
Hemoglobin A1C: 6 % (ref 4.0–6.0)

## 2015-04-19 LAB — BASIC METABOLIC PANEL
Anion Gap: 13 mmol/L (ref 9–17)
BUN: 14 mg/dL (ref 6–20)
CO2: 28 mmol/L (ref 20–31)
Calcium: 9.1 mg/dL (ref 8.6–10.4)
Chloride: 98 mmol/L (ref 98–107)
Creatinine: 0.5 mg/dL (ref 0.50–0.90)
GFR African American: >60 mL/min (ref >60)
GFR Non-African American: >60 mL/min (ref >60)
Glucose: 105 mg/dL — ABNORMAL HIGH (ref 70–99)
Potassium: 4.4 mmol/L (ref 3.7–5.3)
Sodium: 139 mmol/L (ref 135–144)

## 2015-04-19 LAB — LIPID PANEL
Chol/HDL Ratio: 2.6 (ref <5)
Cholesterol: 147 mg/dL (ref <200)
HDL: 57 mg/dL (ref >40)
LDL Cholesterol: 71 mg/dL (ref 0–130)
Triglycerides: 93 mg/dL (ref <150)

## 2015-04-19 LAB — MICROALBUMIN, UR
Creatinine, Ur: 129.5 mg/dL (ref 28.0–217.0)
Microalb, Ur: 29 mg/L
Microalb/Crt. Ratio: 22 mcg/mg creat (ref <25)

## 2015-04-19 LAB — T4, FREE: Thyroxine, Free: 1.13 ng/dL (ref 0.93–1.70)

## 2015-08-22 ENCOUNTER — Inpatient Hospital Stay
Admit: 2015-08-22 | Discharge: 2015-08-22 | Disposition: A | Discharge status: Home / Self Care | Payer: MEDICARE | Attending: Emergency Medicine

## 2015-08-22 DIAGNOSIS — N390 Urinary tract infection, site not specified: Secondary | ICD-10-CM

## 2015-08-22 LAB — UA W/REFLEX CULTURE
Bilirubin Urine: NEGATIVE
Glucose, Ur: NEGATIVE
Nitrite, Urine: NEGATIVE
Specific Gravity, UA: 1.016 (ref 1.005–1.030)
Urobilinogen, Urine: NORMAL
pH, UA: 6 (ref 5.0–8.0)

## 2015-08-22 LAB — MICROSCOPIC URINALYSIS
RBC, UA: 20 /HPF (ref 0–2)
WBC, UA: 20 /HPF (ref 0–5)

## 2015-08-22 MED ORDER — PHENAZOPYRIDINE HCL 100 MG PO TABS
100 MG | Freq: Once | ORAL | Status: AC
Start: 2015-08-22 — End: 2015-08-22
  Administered 2015-08-22: 16:00:00 200 mg via ORAL

## 2015-08-22 MED ORDER — CEPHALEXIN 500 MG PO CAPS
500 MG | Freq: Once | ORAL | Status: AC
Start: 2015-08-22 — End: 2015-08-22
  Administered 2015-08-22: 16:00:00 500 mg via ORAL

## 2015-08-22 MED ORDER — PHENAZOPYRIDINE HCL 200 MG PO TABS
200 MG | ORAL_TABLET | Freq: Three times a day (TID) | ORAL | 0 refills | Status: AC | PRN
Start: 2015-08-22 — End: 2015-08-25

## 2015-08-22 MED ORDER — CEPHALEXIN 500 MG PO CAPS
500 MG | ORAL_CAPSULE | Freq: Two times a day (BID) | ORAL | 0 refills | Status: AC
Start: 2015-08-22 — End: 2015-08-29

## 2015-08-22 MED FILL — PHENAZOPYRIDINE HCL 100 MG PO TABS: 100 MG | ORAL | Qty: 2

## 2015-08-22 MED FILL — CEPHALEXIN 500 MG PO CAPS: 500 MG | ORAL | Qty: 1

## 2015-08-22 NOTE — Progress Notes (Signed)
Reviewed patient's urine culture - culture positive for Ecoli.  Patient was discharged on cephalexin, and culture is sensitive to prescribed medication.  Antibiotic prescribed at discharge is appropriate - no changes made to antibiotic regimen.     Ali Alireza Pollack PharmD

## 2015-08-22 NOTE — ED Notes (Signed)
Patient was straight cath for urine, patient tolerated the procedure well. Patient is resting on cot, will continue to monitor.      Johnnette Gourdolleen G Taylan Mayhan, RN  08/22/15 1049

## 2015-08-22 NOTE — ED Notes (Signed)
Pt to ed from home c/o dysuria and blood in her urine with incontinence several times since yesterday. Patient states she has the urgency to go more frequently than normal. Patient denies having any chest pain or SOB.      Johnnette Gourdolleen G Lounette Sloan, RN  08/22/15 1014

## 2015-08-22 NOTE — ED Provider Notes (Signed)
[facility] ED  Emergency Department Encounter  Emergency Medicine Resident     Pt Name: Shannon Allison  MRN: [id number]  Birthdate 12/05/1960  Date of evaluation: 08/22/15  PCP:  Harriet Pho, CNP    CHIEF COMPLAINT       Chief Complaint   Patient presents with   ??? Urinary Tract Infection     dysuria and blood in urine since yesterday with incontinence       HISTORY OF PRESENT ILLNESS  (Location/Symptom, Timing/Onset, Context/Setting, Quality, Duration, Modifying Factors, Severity.)      Shannon Allison is a 55 y.o. female who presents with dysuria, frequency, hesitancy, urgency.  Patient states she feels like she has a UTI.  States symptoms have been ongoing for the past 2 days.  He denies any fever, nausea, vomiting, flank pain, back pain.  She denies any vaginal bleeding or discomfort.  She states she has seen some blood in her urine as well.    PAST MEDICAL / SURGICAL / SOCIAL / FAMILY HISTORY      has a past medical history of Asthma.     has no past surgical history on file.    Social History     Social History   ??? Marital status: Single     Spouse name: N/A   ??? Number of children: N/A   ??? Years of education: N/A     Occupational History   ??? Not on file.     Social History Main Topics   ??? Smoking status: Never Smoker   ??? Smokeless tobacco: Not on file   ??? Alcohol use No   ??? Drug use: No   ??? Sexual activity: Not on file     Other Topics Concern   ??? Not on file     Social History Narrative       History reviewed. No pertinent family history.    Allergies:  Review of patient's allergies indicates no known allergies.    Home Medications:  Prior to Admission medications    Medication Sig Start Date End Date Taking? Authorizing Provider   cephALEXin (KEFLEX) 500 MG capsule Take 1 capsule by mouth 2 times daily for 7 days 08/22/15 08/29/15 Yes Warrick Parisian, DO   phenazopyridine (PYRIDIUM) 200 MG tablet Take 1 tablet by mouth 3 times daily as needed for Pain (bladder spasm/pain) 08/22/15 08/25/15  Yes Warrick Parisian, DO       REVIEW OF SYSTEMS    (2-9 systems for level 4, 10 or more for level 5)      Review of Systems   Constitutional: Negative for chills, fatigue and fever.   Respiratory: Negative for cough and shortness of breath.    Cardiovascular: Negative for chest pain and palpitations.   Gastrointestinal: Positive for abdominal pain. Negative for blood in stool, constipation, diarrhea, nausea and vomiting.   Genitourinary: Positive for difficulty urinating, dysuria, frequency and hematuria. Negative for flank pain, pelvic pain, vaginal bleeding, vaginal discharge and vaginal pain.   Musculoskeletal: Negative for back pain and neck pain.   Skin: Negative for rash.   Neurological: Negative for weakness and numbness.       PHYSICAL EXAM   (up to 7 for level 4, 8 or more for level 5)      INITIAL VITALS:   BP 131/79   Pulse 96   Temp 98.2 ??F (36.8 ??C) (Oral)    Resp 17   Ht 5\' 9"  (1.753 m)  Wt 220 lb (99.8 kg)   SpO2 93%   BMI 32.49 kg/m2    Physical Exam   Constitutional: She is oriented to person, place, and time. She appears well-developed and well-nourished.   HENT:   Head: Normocephalic and atraumatic.   Cardiovascular: Normal rate, regular rhythm, normal heart sounds and intact distal pulses.  Exam reveals no gallop and no friction rub.    No murmur heard.  Pulmonary/Chest: Effort normal and breath sounds normal. No respiratory distress. She has no wheezes. She has no rales.   Abdominal: Soft. Bowel sounds are normal. She exhibits no distension. There is tenderness. There is no rebound and no guarding.   Mild suprapubic tenderness, no rebound or guarding   Musculoskeletal: Normal range of motion. She exhibits no edema or tenderness.   Neurological: She is alert and oriented to person, place, and time.   Skin: Skin is warm and dry. No rash noted. No erythema.   Nursing note and vitals reviewed.      DIFFERENTIAL  DIAGNOSIS     PLAN (LABS / IMAGING / EKG):  Orders Placed This Encounter   Procedures   ???  Urine culture clean catch   ??? UA W/REFLEX CULTURE   ??? Microscopic Urinalysis   ??? Straight cath       MEDICATIONS ORDERED:  Orders Placed This Encounter   Medications   ??? cephALEXin (KEFLEX) capsule 500 mg   ??? phenazopyridine (PYRIDIUM) tablet 200 mg   ??? cephALEXin (KEFLEX) 500 MG capsule     Sig: Take 1 capsule by mouth 2 times daily for 7 days     Dispense:  14 capsule     Refill:  0   ??? phenazopyridine (PYRIDIUM) 200 MG tablet     Sig: Take 1 tablet by mouth 3 times daily as needed for Pain (bladder spasm/pain)     Dispense:  6 tablet     Refill:  0       DDX: Symptoms consistent with UTI, we'll check urinalysis, patient with very minimal urine, we will do straight cathed.  No signs or symptoms file oh, kidney stone, patient nontoxic in appearance.  Check labs and reassess.    DIAGNOSTIC RESULTS / EMERGENCY DEPARTMENT COURSE / MDM     LABS:  Results for orders placed or performed during the hospital encounter of 08/22/15   UA W/REFLEX CULTURE   Result Value Ref Range    Color, UA YELLOW YEL    Turbidity UA CLOUDY (A) CLEAR    Glucose, Ur NEGATIVE NEG    Bilirubin Urine NEGATIVE NEG    Ketones, Urine TRACE (A) NEG    Specific Gravity, UA 1.016 1.005 - 1.030    Urine Hgb LARGE (A) NEG    pH, UA 6.0 5.0 - 8.0    Protein, UA 3+ (A) NEG    Urobilinogen, Urine Normal NORM    Nitrite, Urine NEGATIVE NEG    Leukocyte Esterase, Urine MODERATE (A) NEG    Urinalysis Comments Culture ordered based on defined criteria.    Microscopic Urinalysis   Result Value Ref Range    -          WBC, UA 20 TO 50 0 - 5 /HPF    RBC, UA 20 TO 50 0 - 2 /HPF    Casts UA NOT REPORTED 0 - 2 /LPF    Crystals UA NOT REPORTED NONE /HPF    Epithelial Cells UA None 0 - 5 /HPF    Renal  Epithelial, Urine NOT REPORTED 0 /HPF    Bacteria, UA FEW (A) NONE    Mucus, UA 1+ (A) NONE    Trichomonas, UA NOT REPORTED NONE    Amorphous, UA NOT REPORTED NONE    Other Observations UA NOT REPORTED NREQ    Yeast, UA NOT REPORTED NONE     EMERGENCY DEPARTMENT  COURSE:  11:55 AM  Urinalysis showing UTI, will start on Keflex, give dose of Pyridium, seven-day course of Keflex, return here for any worsening or changing symptoms, Pyridium provided, patient made aware that it will turn all of her bodily fluids orange. Use for maximum of 2 days.  If having any fever, nausea, vomiting, back pain, any other worsening or changing symptoms return to emergency department immediately otherwise call tomorrow for appointment with PCP.    PROCEDURES:  None    CONSULTS:  None    CRITICAL CARE:  None    FINAL IMPRESSION      1. Urinary tract infection, site unspecified          DISPOSITION / PLAN     DISPOSITION     PATIENT REFERRED TO:  Harriet Pho, CNP  [address] [place] [address]  [phone]    In 2 days      [facility] ED  [address]  [place] [address]  [phone]    As needed, If symptoms worsen      DISCHARGE MEDICATIONS:  New Prescriptions    CEPHALEXIN (KEFLEX) 500 MG CAPSULE    Take 1 capsule by mouth 2 times daily for 7 days    PHENAZOPYRIDINE (PYRIDIUM) 200 MG TABLET    Take 1 tablet by mouth 3 times daily as needed for Pain (bladder spasm/pain)       Warrick Parisian, DO  Emergency Medicine Resident    (Please note that portions of this note were completed with a voice recognition program.  Efforts were made to edit the dictations but occasionally words are mis-transcribed.)       Warrick Parisian, DO  Resident  08/22/15 1155

## 2015-08-22 NOTE — ED Provider Notes (Signed)
[facility]     Emergency Department     Faculty Attestation        I performed a history and physical examination of the patient and discussed management with the resident. I reviewed the resident???s note and agree with the documented findings and plan of care. Any areas of disagreement are noted on the chart. I was personally present for the key portions of any procedures. I have documented in the chart those procedures where I was not present during the key portions. I have reviewed the emergency nurses triage note. I agree with the chief complaint, past medical history, past surgical history, allergies, medications, social and family history as documented unless otherwise noted below.  For Physician Assistant/ Nurse Practitioner cases/documentation I have personally evaluated this patient and have completed at least one if not all key elements of the E/M (history, physical exam, and MDM). Additional findings are as noted.    Vital Signs:BP: 131/79   Pulse: 96   Resp: 17   Temp: 98.2 ??F (36.8 ??C)  SpO2: 93 %  PCP:  Harriet PhoWendy D Goodrich, CNP    Pertinent Comments:         Critical Care  None      (Please note that portions of this note were completed with a voice recognition program. Efforts were made to edit the dictations but occasionally words are mis-transcribed. Whenever words are used in this note in any gender, they shall be construed as though they were used in the gender appropriate to the circumstances; and whenever words are used in this note in the singular or plural form, they shall be construed as though they were used in the form appropriate to the circumstances.)    Chevis PrettyAaron Vinisha Faxon, MD Lacie ScottsFAAEM FACEP  Attending Emergency Medicine Physician             Salomon MastAaron M Keaja Reaume, MD  09/05/15 [phone]

## 2015-08-24 LAB — URINE CULTURE CLEAN CATCH

## 2015-11-12 ENCOUNTER — Encounter

## 2015-11-12 ENCOUNTER — Inpatient Hospital Stay: Admit: 2015-11-12 | Payer: MEDICARE | Primary: Nurse Practitioner

## 2015-11-12 DIAGNOSIS — Z1231 Encounter for screening mammogram for malignant neoplasm of breast: Secondary | ICD-10-CM

## 2015-11-12 LAB — BASIC METABOLIC PANEL
Anion Gap: 11 mmol/L (ref 9–17)
BUN: 14 mg/dL (ref 6–20)
CO2: 28 mmol/L (ref 20–31)
Calcium: 8.6 mg/dL (ref 8.6–10.4)
Chloride: 102 mmol/L (ref 98–107)
Creatinine: 0.43 mg/dL — ABNORMAL LOW (ref 0.50–0.90)
GFR African American: >60 mL/min (ref >60)
GFR Non-African American: >60 mL/min (ref >60)
Glucose: 86 mg/dL (ref 70–99)
Potassium: 4.1 mmol/L (ref 3.7–5.3)
Sodium: 141 mmol/L (ref 135–144)

## 2015-11-12 LAB — CBC WITH AUTO DIFFERENTIAL
Absolute Eos #: 0.1 10*3/uL (ref 0.0–0.4)
Absolute Lymph #: 1.3 10*3/uL (ref 1.0–4.8)
Absolute Mono #: 0.5 10*3/uL (ref 0.1–1.2)
Basophils Absolute: 0 10*3/uL (ref 0.0–0.2)
Basophils: 0 %
Eosinophils %: 2 %
Hematocrit: 40.2 % (ref 36–46)
Hemoglobin: 13.4 g/dL (ref 12.0–16.0)
Lymphocytes: 20 %
MCH: 29.2 pg (ref 26–34)
MCHC: 33.2 g/dL (ref 31–37)
MCV: 87.8 fL (ref 80–100)
MPV: 8.9 fL (ref 6.0–12.0)
Monocytes: 8 %
Platelets: 271 10*3/uL (ref 140–450)
RBC: 4.58 m/uL (ref 4.0–5.2)
RDW: 14.1 % (ref 12.5–15.4)
Seg Neutrophils: 70 %
Segs Absolute: 4.5 10*3/uL (ref 1.8–7.7)
WBC: 6.4 10*3/uL (ref 3.5–11.0)

## 2015-11-12 LAB — LIPID, FASTING
Chol/HDL Ratio: 3 (ref <5)
Cholesterol, Fasting: 117 mg/dL (ref <200)
HDL: 39 mg/dL — ABNORMAL LOW (ref >40)
LDL Cholesterol: 57 mg/dL (ref 0–130)
Triglyceride, Fasting: 107 mg/dL (ref <150)

## 2015-11-12 LAB — TSH: TSH: 1.36 mIU/L (ref 0.30–5.00)

## 2015-11-12 LAB — HEPATIC FUNCTION PANEL
ALT: 10 U/L (ref 5–33)
AST: 12 U/L (ref <32)
Albumin/Globulin Ratio: 1.1 (ref 1.0–2.5)
Albumin: 3.6 g/dL (ref 3.5–5.2)
Alkaline Phosphatase: 68 U/L (ref 35–104)
Bilirubin, Direct: 0.08 mg/dL (ref <0.31)
Bilirubin, Indirect: 0.14 mg/dL (ref 0.00–1.00)
Total Bilirubin: 0.22 mg/dL — ABNORMAL LOW (ref 0.3–1.2)
Total Protein: 6.9 g/dL (ref 6.4–8.3)

## 2015-11-12 LAB — T4, FREE: Thyroxine, Free: 1.12 ng/dL (ref 0.93–1.70)

## 2015-11-12 LAB — MICROALBUMIN, UR
Creatinine, Ur: 105.8 mg/dL (ref 28.0–217.0)
Microalb, Ur: <12 mg/L (ref <21)
Microalb/Crt. Ratio: 11 mcg/mg creat (ref <25)

## 2015-11-12 LAB — VITAMIN D 25 HYDROXY: Vit D, 25-Hydroxy: 27.5 ng/mL — ABNORMAL LOW (ref 30.0–100.0)

## 2015-11-12 LAB — HEMOGLOBIN A1C
Estimated Avg Glucose: 131 mg/dL
Hemoglobin A1C: 6.2 % — ABNORMAL HIGH (ref 4.0–6.0)

## 2016-01-04 ENCOUNTER — Inpatient Hospital Stay: Payer: MEDICARE

## 2016-01-04 MED ORDER — SODIUM CHLORIDE 0.9 % IV SOLN
0.9 % | INTRAVENOUS | Status: DC
Start: 2016-01-04 — End: 2016-01-04
  Administered 2016-01-04: 11:00:00 via INTRAVENOUS

## 2016-01-04 MED ORDER — LIDOCAINE HCL 1 % IJ SOLN
1 % | INTRAMUSCULAR | Status: DC | PRN
Start: 2016-01-04 — End: 2016-01-04
  Administered 2016-01-04: 12:00:00 50 via INTRAVENOUS

## 2016-01-04 MED ORDER — PROPOFOL 200 MG/20ML IV EMUL
200 MG/20ML | INTRAVENOUS | Status: DC | PRN
Start: 2016-01-04 — End: 2016-01-04
  Administered 2016-01-04: 12:00:00 10 via INTRAVENOUS
  Administered 2016-01-04: 12:00:00 20 via INTRAVENOUS
  Administered 2016-01-04 (×2): 10 via INTRAVENOUS
  Administered 2016-01-04: 12:00:00 100 via INTRAVENOUS
  Administered 2016-01-04: 12:00:00 30 via INTRAVENOUS
  Administered 2016-01-04: 12:00:00 10 via INTRAVENOUS
  Administered 2016-01-04: 12:00:00 20 via INTRAVENOUS

## 2016-01-04 NOTE — Anesthesia Pre-Procedure Evaluation (Signed)
Department of Anesthesiology  Preprocedure Note       Name:  Shannon Allison   Age:  55 y.o.  DOB:  11-May-1960                                          MRN:  [id number]         Date:  01/04/2016      Surgeon: Moishe SpiceSurgeon(s):  Link SnufferSyed Uzair T Hamdani, MD    Procedure: Procedure(s):  EGD ESOPHAGOGASTRODUODENOSCOPY  COLONOSCOPY WITH BIOPSY    Medications prior to admission:   Prior to Admission medications    Medication Sig Start Date End Date Taking? Authorizing Provider   benztropine (COGENTIN) 0.5 MG tablet Take 0.5 mg by mouth daily   Yes Historical Provider, MD   LORazepam (ATIVAN) 1 MG tablet Take 1 mg by mouth every 8 hours as needed for Anxiety   Yes Historical Provider, MD   metFORMIN (GLUCOPHAGE) 500 MG tablet Take 500 mg by mouth 2 times daily (with meals)   Yes Historical Provider, MD   hydrOXYzine (VISTARIL) 25 MG capsule Take 25 mg by mouth 3 times daily as needed for Itching   Yes Historical Provider, MD   lisinopril (PRINIVIL;ZESTRIL) 5 MG tablet Take 5 mg by mouth daily   Yes Historical Provider, MD   simvastatin (ZOCOR) 20 MG tablet Take 20 mg by mouth nightly   Yes Historical Provider, MD       Current medications:    Current Facility-Administered Medications   Medication Dose Route Frequency Provider Last Rate Last Dose   ??? 0.9 % sodium chloride infusion   Intravenous Continuous Delmar LandauScott A Corman, MD 100 mL/hr at 01/04/16 [id number]         Allergies:  No Known Allergies    Problem List:  There is no problem list on file for this patient.      Past Medical History:        Diagnosis Date   ??? Asthma    ??? Diabetes mellitus (HCC)        Past Surgical History:        Procedure Laterality Date   ??? NASAL SEPTUM SURGERY Bilateral        Social History:    Social History   Substance Use Topics   ??? Smoking status: Current Every Day Smoker   ??? Smokeless tobacco: Never Used   ??? Alcohol use No                                Ready to quit: Not Answered  Counseling given: Not Answered      Vital Signs (Current):   Vitals:    01/04/16  0658   Pulse: 67   Temp: 97.9 ??F (36.6 ??C)   TempSrc: Oral   Weight: 260 lb (117.9 kg)   Height: 5\' 9"  (1.753 m)                                              BP Readings from Last 3 Encounters:   08/22/15 131/79   07/05/13 134/87       NPO Status: Time of last liquid consumption: 2300  Time of last solid consumption: 1800                        Date of last liquid consumption: 01/03/16                        Date of last solid food consumption: 01/03/16    BMI:   Wt Readings from Last 3 Encounters:   01/04/16 260 lb (117.9 kg)   08/22/15 220 lb (99.8 kg)   07/05/13 195 lb (88.5 kg)     Body mass index is 38.4 kg/(m^2).    CBC:   Lab Results   Component Value Date    WBC 6.4 11/12/2015    RBC 4.58 11/12/2015    RBC 4.01 05/25/2010    HGB 13.4 11/12/2015    HCT 40.2 11/12/2015    MCV 87.8 11/12/2015    RDW 14.1 11/12/2015    PLT 271 11/12/2015    PLT 239 05/25/2010       CMP:   Lab Results   Component Value Date    NA 141 11/12/2015    K 4.1 11/12/2015    CL 102 11/12/2015    CO2 28 11/12/2015    BUN 14 11/12/2015    CREATININE 0.43 11/12/2015    GFRAA >60 11/12/2015    LABGLOM >60 11/12/2015    GLUCOSE 86 11/12/2015    GLUCOSE 118 05/27/2010    PROT 6.9 11/12/2015    CALCIUM 8.6 11/12/2015    BILITOT 0.22 11/12/2015    ALKPHOS 68 11/12/2015    AST 12 11/12/2015    ALT 10 11/12/2015       POC Tests: No results for input(s): POCGLU, POCNA, POCK, POCCL, POCBUN, POCHEMO, POCHCT in the last 72 hours.    Coags:   Lab Results   Component Value Date    PROTIME 10.4 04/28/2010    INR 1.0 04/28/2010    APTT 25.4 04/28/2010       HCG (If Applicable): No results found for: PREGTESTUR, PREGSERUM, HCG, HCGQUANT     ABGs: No results found for: PHART, PO2ART, PCO2ART, HCO3ART, BEART, O2SATART     Type & Screen (If Applicable):  No results found for: LABABO, LABRH    Anesthesia Evaluation  Patient summary reviewed no history of anesthetic complications:   Airway: Mallampati: III       Mouth opening: > = 3 FB  Dental:          Pulmonary:normal exam  breath sounds clear to auscultation  (+) asthma:     ROS comment: smoker   Cardiovascular:  Exercise tolerance: good (>4 METS),       (-) past MI, CAD and  CHF      Rhythm: regular  Rate: normal                 Neuro/Psych:   (+) neuromuscular disease: Parkinson's disease,    GI/Hepatic/Renal:        (-) liver disease     Endo/Other:    (+) Type II DM, ,       Abdominal:                    Anesthesia Plan    ASA 3     MAC     intravenous induction   Anesthetic plan and risks discussed with patient.    Plan discussed with CRNA.  Shannon Rea Key Cen, MD   01/04/2016

## 2016-01-04 NOTE — Anesthesia Post-Procedure Evaluation (Signed)
Department of Anesthesiology  Postprocedure Note    Patient: Shannon Allison  MRN: [id number]  Birthdate: 1960/08/02  Date of evaluation: 01/04/2016  Time:  8:36 AM     Procedure Summary     Date Anesthesia Start Anesthesia Stop Room / Location    01/04/16 0751 0821 [facility] / [facility]       Procedure Diagnosis Surgeon Responsible Provider    EGD ESOPHAGOGASTRODUODENOSCOPY (N/A ); COLONOSCOPY (DYSPEPSIA, CONSTIPATION ) Link SnufferSyed Uzair T Hamdani, MD Nadeen LandauKevin M Raduege, MD          Anesthesia Type: MAC    Aldrete Phase I:      Aldrete Phase II: Aldrete Score: 10    Last vitals:  BP 112/82 (01/04/16 0834)    Temp 36.4 ??C (97.6 ??F) (01/04/16 0818)    Pulse 62 (01/04/16 0834)   Resp 13 (01/04/16 0834)    SpO2 92 % (01/04/16 0834)      Anesthesia Post Evaluation    Patient location during evaluation: bedside  Patient participation: complete - patient participated  Level of consciousness: awake  Pain score: 0  Airway patency: patent  Nausea & Vomiting: no nausea and no vomiting  Complications: no  Cardiovascular status: blood pressure returned to baseline  Respiratory status: acceptable  Hydration status: euvolemic

## 2016-01-04 NOTE — H&P (Signed)
History and Physical    Pt Name: Shannon Allison  MRN: [id number]  Birth date: [birth year]  Date of evaluation: 01/04/2016  Primary Care Physician: Harriet PhoWendy D Goodrich, CNP  Patient evaluated at the request of  Dr. Windell MouldingHamdani    Reason for evaluation: GERD  SUBJECTIVE:   History of Chief Complaint:      Shannon Allison is a 55 y.o. female     Who is scheduled to have   Her  Colonoscopy and  EGD today    Hx of smoking x 40 yrs   , her last colonoscopy was approx 5 yrs ago , never had a EGD before , fried greasy food  Makes her stomach upset , denies any GI bleeding . Hx of DM  X 10 yrs approx .              Past Medical History      has a past medical history of Asthma and Diabetes mellitus (HCC).    Past Surgical History   has a past surgical history that includes Nasal septum surgery (Bilateral).       Medications   Scheduled Meds:   Continuous Infusions:  ??? sodium chloride       PRN Meds:.  Allergies  has No Known Allergies.    Family History    family history is not on file.    No family status information on file.         Social History  Social History     Social History   ??? Marital status: Single     Spouse name: N/A   ??? Number of children: N/A   ??? Years of education: N/A     Occupational History   ??? Not on file.     Social History Main Topics   ??? Smoking status: Current Every Day Smoker   ??? Smokeless tobacco: Never Used   ??? Alcohol use No   ??? Drug use: No   ??? Sexual activity: Not on file     Other Topics Concern   ??? Not on file     Social History Narrative                 Hobbies:  Cats , watches TV news     OBJECTIVE:   VITALS:  height is 5\' 9"  (1.753 m) and weight is 260 lb (117.9 kg). Her oral temperature is 97.9 ??F (36.6 ??C). Her pulse is 67.     CONSTITUTIONAL: Alert and oriented times 3, no acute distress and cooperative to examination. friendly and pleasant     SKIN: rash No    HEENT: Head is normocephalic, atraumatic. EOMI, PERRLA    Oral air way :slightly narrow Yes    NECK: neck supple, no  lymphadenopathy noted, trachea midline and straight       2+ carotid, no bruit    LUNGS: Chest expands equally bilaterally upon respiration, no accessory muscle used. Ausculation reveals no adventitious breath sounds.    CARDIOVASCULAR: "Heart sounds are normal.  Regular rate and rhythm without murmur,    ABDOMEN: Bowel sounds are present in all four quadrants      GENATALIA:Deferred.    NEUROLOGIC: "CN II-XII are grossly intact.       EXTREMITIES: Pitting edema:  No,    Has a left hand  Tremor x 1 yr she reports  Varicose veins: No     Dorsal pedal/posterior tibial pulses palpable: Yes  Grip Strength:   Decrease in left hand       There is no problem list on file for this patient.              IMPRESSIONS:   1.  GERD    does not have a problem list on file.    [facility]  Electronically signed 01/04/2016 at 7:18 AM       Scheduled for: colonoscopy and EGD

## 2016-01-04 NOTE — Op Note (Signed)
[facility]  EGD & COLONOSCOPY      Patient:   Shannon Allison    DOB:    09/22/1960    Referring/PCP: Harriet Pho, CNP  Procedure:   Colonoscopy --diagnostic;     Esophagogastroduodenoscopy  --diagnostic, with biopsy  Facility:  [facility]  Date:     01/04/2016  Endoscopist:  Rock Nephew, MD    Indication:  Constipation and GERD.    Anesthesia:  MAC    Complications: None    Description of Procedure:  Informed consent was obtained from the patient after explanation of the procedure including indications, description of the procedure,  benefits and possible risks and complications of the procedure, and alternatives. Questions were answered.  The patient's history was reviewed and a directed physical examination was performed prior to the procedure.    Patient was monitored throughout the procedure with pulse oximetry and periodic assessment of vital signs. Patient was sedated as noted above. With the patient initially in the left lateral decubitus position, a digital rectal examination was performed and revealed negative without mass, lesions or tenderness, negative without mass or lesions.  The Olympus video colonoscope was placed in the patient's rectum and advanced without difficulty  to the rectum.   The prep was poor.  Examination of the mucosa was performed during both introduction and withdrawal of the colonoscope. Retroflexed view of the rectum was performed.    Findings:   1. Large amount of semi solid brown stool present in the rectal vault precluding visualization . Procedure aborted       With the patient in the left lateral decubitus position, the Olympus videoendoscope was placed in the patient's mouth and under direct visualization passed into the esophagus.  Visualization of the esophagus, stomach, and duodenum was performed during both introduction and withdrawal of the endoscope and retroflexed view of the proximal stomach was obtained. The scope was  passed to the second portion of the duodenum.  The patient tolerated the procedure well and was taken to the recovery area in good condition.    Findings::   Esophagus: Irregular Z line . Biopsied .   Stomach: Gastritis characterized by erythema was seen in the antrum of the stomach. Biopsied to rule out H pylori infection                    Small sessile gastric polyps seen scattered in the body of the stomach . Biopsied                    Normal stomach seen during retroflexion view   Duodenum: normal. Biopsied to rule out celiac disease       Recommendations:  Follow with the biopsy results                                       Avoid NSAID's                                       Repeat Colonoscopy with 2 day prep as today's exam was aborted due to poor prep.  Follow up at the GI office.      Electronically signed Rock Nephew, MDy  on 01/04/2016 at 8:14 AM

## 2016-01-04 NOTE — Discharge Instructions (Signed)
[facility]    POST-ENDOSCOPY INSTRUCTIONS    1. ACTIVITY   No driving, operating machinery, or making important decisions for 24 hours.    Resume normal activity after 24 hours.  You may return to work after 24 hours.    2. DIET        Resume your usual diet unless specified below.       3. MEDICATIONS    Resume your usual medications.     Do not consume alcohol, tranquilizers, or sleeping medications for 24 hour unless advised by your physician.                 4. PHYSICIAN FOLLOW-UP / INSTRUCTIONS    Please call the office/clinic in 10 days for biopsy results:    []  GI clinic:  [phone]) [phone]    [x]  GI office:  [phone]          Follow up with your family physician as planned.    6. NORMAL CHANGES YOU MAY EXPERIENCE AFTER ENDOSCOPY:         EGD/COLON          Sore throat after EGD/COLON   .     A bloated feeling and belching from       air in stomach               You may feel fatigued for the next 24-48    hours due to the prep and sedation    7. CALL YOUR PHYSICIAN IF YOU EXPERIENCE ANY OF THE FOLLOWING      A.  Passing blood rectally or vomiting blood (color may be red or black)      B.  Severe abdominal pain or tenderness (that is not relieved by passing air)      C.  Fever, chills, or excessive sweating      D.  Persistent nausea or vomiting      E.  Redness or swelling at the IV site    If you have additional questions, PLEASE call your doctor or the [facility] [phone])

## 2016-01-04 NOTE — Plan of Care (Signed)
Shannon Allison present to transport patient home.  Patient and [name] verbalized understanding of discharge instructions,  Escorted to lobby via wheelchair for transportation home.

## 2016-01-05 LAB — SURGICAL PATHOLOGY

## 2016-02-10 ENCOUNTER — Inpatient Hospital Stay: Payer: MEDICARE

## 2016-02-10 LAB — POC GLUCOSE FINGERSTICK: POC Glucose: 112 mg/dL — ABNORMAL HIGH (ref 65–105)

## 2016-02-10 MED ORDER — PROPOFOL 200 MG/20ML IV EMUL
200 MG/20ML | INTRAVENOUS | Status: DC | PRN
Start: 2016-02-10 — End: 2016-02-10
  Administered 2016-02-10 (×2): 30 via INTRAVENOUS

## 2016-02-10 MED ORDER — SODIUM CHLORIDE 0.9 % IV SOLN
0.9 % | INTRAVENOUS | Status: DC
Start: 2016-02-10 — End: 2016-02-10
  Administered 2016-02-10: 12:00:00 via INTRAVENOUS

## 2016-02-10 NOTE — Progress Notes (Signed)
Awake.  Abdomen soft non tender non distended. Skin warm and dry.  Discharge criteria met.

## 2016-02-10 NOTE — Anesthesia Pre-Procedure Evaluation (Signed)
Department of Anesthesiology  Preprocedure Note       Name:  Shannon Allison   Age:  55 y.o.  DOB:  1961/02/17                                          MRN:  [id number]         Date:  02/10/2016      Surgeon: Moishe SpiceSurgeon(s):  Link SnufferSyed Uzair T Hamdani, MD    Procedure: Procedure(s):  COLONOSCOPY WITH BIOPSY    Medications prior to admission:   Prior to Admission medications    Medication Sig Start Date End Date Taking? Authorizing Provider   paliperidone palmitate (INVEGA SUSTENNA) 234 MG/1.5ML SUSP IM injection Inject 234 mg into the muscle every 30 days   Yes Historical Provider, MD   desvenlafaxine succinate (PRISTIQ) 50 MG TB24 extended release tablet Take 50 mg by mouth   Yes Historical Provider, MD   fluticasone (FLONASE) 50 MCG/ACT nasal spray 1 spray by Nasal route daily   Yes Historical Provider, MD   montelukast (SINGULAIR) 10 MG tablet Take 10 mg by mouth nightly   Yes Historical Provider, MD   esomeprazole Magnesium (NEXIUM) 40 MG PACK Take 40 mg by mouth daily   Yes Historical Provider, MD   senna-docusate (PERICOLACE) 8.6-50 MG per tablet Take 1 tablet by mouth daily   Yes Historical Provider, MD   atorvastatin (LIPITOR) 20 MG tablet Take 20 mg by mouth daily   Yes Historical Provider, MD   benztropine (COGENTIN) 0.5 MG tablet Take 0.5 mg by mouth daily   Yes Historical Provider, MD   LORazepam (ATIVAN) 1 MG tablet Take 1 mg by mouth every 8 hours as needed for Anxiety   Yes Historical Provider, MD   metFORMIN (GLUCOPHAGE) 500 MG tablet Take 500 mg by mouth 2 times daily (with meals)   Yes Historical Provider, MD   hydrOXYzine (VISTARIL) 25 MG capsule Take 25 mg by mouth 3 times daily as needed for Itching   Yes Historical Provider, MD   lisinopril (PRINIVIL;ZESTRIL) 5 MG tablet Take 5 mg by mouth daily   Yes Historical Provider, MD       Current medications:    Current Facility-Administered Medications   Medication Dose Route Frequency Provider Last Rate Last Dose   ??? 0.9 % sodium chloride infusion   Intravenous  Continuous Link SnufferSyed Uzair T Hamdani, MD           Allergies:  No Known Allergies    Problem List:  There is no problem list on file for this patient.      Past Medical History:        Diagnosis Date   ??? Asthma    ??? Diabetes mellitus (HCC)        Past Surgical History:        Procedure Laterality Date   ??? NASAL SEPTUM SURGERY Bilateral    ??? PR COLON CA SCRN NOT HI RSK IND  01/04/2016    COLONOSCOPY performed by Link SnufferSyed Uzair T Hamdani, MD at [facility]   ??? PR ESOPHAGOGASTRODUODENOSCOPY TRANSORAL DIAGNOSTIC N/A 01/04/2016    EGD ESOPHAGOGASTRODUODENOSCOPY performed by Link SnufferSyed Uzair T Hamdani, MD at [facility]       Social History:    Social History   Substance Use Topics   ??? Smoking status: Current Every Day Smoker   ??? Smokeless tobacco: Never  Used   ??? Alcohol use No                                Ready to quit: Not Answered  Counseling given: Not Answered      Vital Signs (Current):   Vitals:    02/10/16 0700   BP: (!) 125/92   Pulse: 80   Resp: 14   Temp: 36.4 ??C (97.6 ??F)   SpO2: 93%   Weight: 225 lb (102.1 kg)   Height: 5\' 9"  (1.753 m)                                              BP Readings from Last 3 Encounters:   02/10/16 (!) 125/92   01/04/16 118/83   01/04/16 96/71       NPO Status: Time of last liquid consumption: 0000                        Time of last solid consumption: 0000                        Date of last liquid consumption: 02/10/16                        Date of last solid food consumption: 02/09/16    BMI:   Wt Readings from Last 3 Encounters:   02/10/16 225 lb (102.1 kg)   01/04/16 260 lb (117.9 kg)   08/22/15 220 lb (99.8 kg)     Body mass index is 33.23 kg/m??.    CBC:   Lab Results   Component Value Date    WBC 6.4 11/12/2015    RBC 4.58 11/12/2015    RBC 4.01 05/25/2010    HGB 13.4 11/12/2015    HCT 40.2 11/12/2015    MCV 87.8 11/12/2015    RDW 14.1 11/12/2015    PLT 271 11/12/2015    PLT 239 05/25/2010       CMP:   Lab Results   Component Value Date    NA 141 11/12/2015    K 4.1 11/12/2015    CL  102 11/12/2015    CO2 28 11/12/2015    BUN 14 11/12/2015    CREATININE 0.43 11/12/2015    GFRAA >60 11/12/2015    LABGLOM >60 11/12/2015    GLUCOSE 86 11/12/2015    GLUCOSE 118 05/27/2010    PROT 6.9 11/12/2015    CALCIUM 8.6 11/12/2015    BILITOT 0.22 11/12/2015    ALKPHOS 68 11/12/2015    AST 12 11/12/2015    ALT 10 11/12/2015       POC Tests:   Recent Labs      02/10/16   0709   POCGLU  112*       Coags:   Lab Results   Component Value Date    PROTIME 10.4 04/28/2010    INR 1.0 04/28/2010    APTT 25.4 04/28/2010       HCG (If Applicable): No results found for: PREGTESTUR, PREGSERUM, HCG, HCGQUANT     ABGs: No results found for: PHART, PO2ART, PCO2ART, HCO3ART, BEART, O2SATART     Type & Screen (If Applicable):  No results found for: LABABO, LABRH  Anesthesia Evaluation  Patient summary reviewed and Nursing notes reviewed no history of anesthetic complications:   Airway: Mallampati: II  TM distance: >3 FB   Neck ROM: full  Mouth opening: > = 3 FB Dental:          Pulmonary:normal exam  breath sounds clear to auscultation      (-) pneumonia, COPD, asthma, shortness of breath, recent URI, sleep apnea, rhonchi, wheezes, rales and stridor                           Cardiovascular:negative ROS  Exercise tolerance: good (>4 METS),       (-) murmur, weak pulses,  friction rub, systolic click, carotid bruit,  JVD and peripheral edema      Rhythm: regular  Rate: normal                    Neuro/Psych:   {neg ROS              GI/Hepatic/Renal:   (+) GERD: well controlled, bowel prep     (-) hiatal hernia, PUD, hepatitis and liver disease       Endo/Other: negative ROS   (+) Type II DM, no interval change, ,     (-) hypothyroidism, hyperthyroidism, blood dyscrasia, arthritis               Abdominal:           Vascular:                                      Anesthesia Plan      general and MAC     ASA 3       Induction: intravenous.      Anesthetic plan and risks discussed with patient.      Plan discussed with  CRNA.    Attending anesthesiologist reviewed and agrees with Pre Eval content              Cyndia Skeeters, CRNA   02/10/2016

## 2016-02-10 NOTE — Anesthesia Post-Procedure Evaluation (Signed)
Department of Anesthesiology  Postprocedure Note    Patient: Shannon Allison  MRN: [id number]  Birthdate: Mar 13, 1961  Date of evaluation: 02/10/2016  Time:  8:48 AM     Procedure Summary     Date:  02/10/16 Room / Location:  [facility] / [facility]    Anesthesia Start:  0808 Anesthesia Stop:  0816    Procedure:  COLONOSCOPY; incomplete; aborted due to poor prep (N/A ) Diagnosis:  (CONSTIPATION)    Surgeon:  Link SnufferSyed Uzair T Hamdani, MD Responsible Provider:  Cristy Hiltsrystal Isidoro Santillana, MD    Anesthesia Type:  general, MAC ASA Status:  3          Anesthesia Type: general, MAC    Aldrete Phase I: Aldrete Score: 10    Aldrete Phase II: Aldrete Score: 9    Last vitals: Reviewed and per EMR flowsheets.       Anesthesia Post Evaluation    Patient location during evaluation: PACU  Patient participation: complete - patient participated  Level of consciousness: awake  Pain score: 0  Airway patency: patent  Nausea & Vomiting: no nausea  Complications: no  Cardiovascular status: hemodynamically stable  Respiratory status: acceptable  Hydration status: euvolemic

## 2016-02-10 NOTE — H&P (Signed)
History and Physical    Pt Name: Shannon Allison  MRN: [id number]  Birth date: 08/23/1960  Date of evaluation: 02/10/2016  Primary Care Physician: Harriet PhoWendy D Goodrich, CNP  Patient evaluated at the request of  Dr. Windell MouldingHamdani    Reason for evaluation:  Colon polyps   SUBJECTIVE:   History of Chief Complaint:      Shannon Allison is a 55 y.o. female  Who is s/p EGD and colonoscopy in Sept/2017. She reported today that colon polyps was found on her Sept/2017 colonoscopy, smokes 3 cig a day .      Hx of smoking x 40 yrs    fried greasy food  Makes her stomach upset , denies any GI bleeding . Hx of DM  X 10 yrs approx .              Past Medical History      has a past medical history of Asthma and Diabetes mellitus (HCC).    Past Surgical History   has a past surgical history that includes Nasal septum surgery (Bilateral); esophagogastroduodenoscopy transoral diagnostic (N/A, 01/04/2016); and colon ca scrn not hi rsk ind (01/04/2016).       Medications   Scheduled Meds:  Continuous Infusions:  PRN Meds:.  Allergies  has No Known Allergies.    Family History    family history is not on file.    No family status information on file.         Social History  Social History     Social History   ??? Marital status: Single     Spouse name: N/A   ??? Number of children: N/A   ??? Years of education: N/A     Occupational History   ??? Not on file.     Social History Main Topics   ??? Smoking status: Current Every Day Smoker   ??? Smokeless tobacco: Never Used   ??? Alcohol use No   ??? Drug use: No   ??? Sexual activity: Not on file     Other Topics Concern   ??? Not on file     Social History Narrative   ??? No narrative on file                 Hobbies:  Cats , TV News     OBJECTIVE:   VITALS:  height is 5\' 9"  (1.753 m) and weight is 225 lb (102.1 kg). Her temperature is 97.6 ??F (36.4 ??C). Her blood pressure is 125/92 (abnormal) and her pulse is 80. Her respiration is 14 and oxygen saturation is 93%.     CONSTITUTIONAL: Alert and oriented times 3, no  acute distress and cooperative to examination. friendly and pleasant , fine hand and feet tremors     SKIN: rash No    HEENT: Head is normocephalic, atraumatic. EOMI, PERRLA    Oral air way :slightly narrow Yes    NECK: neck supple, no lymphadenopathy noted, trachea midline and straight       2+ carotid, no bruit    LUNGS: Chest expands equally bilaterally upon respiration, no accessory muscle used. Ausculation reveals no adventitious breath sounds.    CARDIOVASCULAR: "Heart sounds are normal.  Regular rate and rhythm without murmur,    ABDOMEN: Bowel sounds are present in all four quadrants  , over weight    GENATALIA:Deferred.    NEUROLOGIC: "CN II-XII are grossly intact.       EXTREMITIES: Pitting edema:  No,  Varicose veins: No     Dorsal pedal/posterior tibial pulses palpable: Yes        Grip Strength:  Normal       There is no problem list on file for this patient.              IMPRESSIONS:   1.  colon polyps   2.  does not have a problem list on file.    Dewitt HoesDANIEL P Emanuela Runnion [facility]  Electronically signed 02/10/2016 at 7:05 AM       Scheduled for: colonoscopy

## 2016-02-10 NOTE — Op Note (Signed)
[facility]        COL[facility]OSCOPY    Patient:   Shannon Allison    DOB:    [birth year]    Referring/PCP: Harriet PhoWendy D Goodrich, CNP  [facility]  Procedure:   Colonoscopy --diagnostic  Date:     02/10/2016  Endoscopist:  Rock NephewSyed Areli Jowett, MD    Indication:  constipation  History of polyps .    Anesthesia: MAC    Complications:  None    Description of Procedure:  Informed consent was obtained from the patient after explanation of the procedure including indications, description of the procedure,  benefits and possible risks and complications of the procedure, and alternatives. Questions were answered.  The patient's history was reviewed and a directed physical examination was performed prior to the procedure.    Patient was monitored throughout the procedure with pulse oximetry and periodic assessment of vital signs. Patient was sedated as noted above. With the patient initially in the left lateral decubitus position, a digital rectal examination was performed and revealed negative without mass, lesions or tenderness.  The Olympus video colonoscope was placed in the patient's rectum and advanced without difficulty  to the rectum.  The prep was poor.  Examination of the mucosa was performed during both introduction and withdrawal of the colonoscope. Retroflexed view of the rectum was performed.  The patient  was taken to the recovery area in good condition.     Findings:     1. Large amount of brown liquid stool present in the rectal vault. Precluding visualization . Procedure aborted      Recommendations:  Follow up at the GI office to plan a bowel prep for her. Patient has failed colonoscopy twice due to poor prep.     Rock NephewSyed Lavontay Kirk, MD

## 2016-02-10 NOTE — Discharge Instructions (Signed)
[facility]    POST-ENDOSCOPY INSTRUCTIONS    1. ACTIVITY   No driving, operating machinery, or making important decisions for 24 hours.    Resume normal activity after 24 hours.  You may return to work after 24 hours.    2. DIET        Resume your usual diet unless specified below.       3. MEDICATIONS    Resume your usual medications.     Do not consume alcohol, tranquilizers, or sleeping medications for 24 hour unless advised by your physician.                 4. PHYSICIAN FOLLOW-UP / INSTRUCTIONS    Please call the office/clinic in 10 days for biopsy results:    []  GI clinic:  [phone]    [x]  GI office:  [phone]          Follow up with your family physician as planned.    6. NORMAL CHANGES YOU MAY EXPERIENCE AFTER ENDOSCOPY:        FLEXIBLE SIGMOIDOSCOPY:       Passing of gas for several hours.      Some mild abdominal cramping.                   You may feel fatigued for the next 24-48   hours due to the prep and sedation    7. CALL YOUR PHYSICIAN IF YOU EXPERIENCE ANY OF THE FOLLOWING      A.  Passing blood rectally or vomiting blood (color may be red or black)      B.  Severe abdominal pain or tenderness (that is not relieved by passing air)      C.  Fever, chills, or excessive sweating      D.  Persistent nausea or vomiting      E.  Redness or swelling at the IV site    If you have additional questions, PLEASE call your doctor or the [facility] [phone])

## 2016-02-28 ENCOUNTER — Ambulatory Visit: Admit: 2016-02-28 | Discharge: 2016-02-28 | Payer: MEDICARE | Attending: Adult Health | Primary: Nurse Practitioner

## 2016-02-28 DIAGNOSIS — R251 Tremor, unspecified: Secondary | ICD-10-CM

## 2016-02-28 NOTE — Progress Notes (Signed)
[facility]  [address]  [place], [place] [address]  Dept: [phone]  Dept Fax: [phone]  ??                                Teodoro Kil, MD                                 Boris Lown, MD                       Magda Paganini, MD                                       Ahmed B. Georgetta Haber, MD                        Jacqlyn Krauss, MD                             Darcella Cheshire, CNP      New Patient Consultation    02/28/2016    History of Present Illness:  Your patient, Shannon Allison is being seen today for the evaluation of tremor.  Patient is a left handed 55 year old woman with past medical history being significant for diabetes mellitus type 2, hypertension, dyslipidemia, schizophrenia, and bipolar disorder.  The patient's tremor has become worse in the last 2 months, but she actually does not know exactly when it began.  The tremor is in her bilateral upper extremities as well as her left lower extremity.  The tremor is worse on the left than the right.  It is most pronounced at rest versus activity.  She will occasionally notice vocal tremor.  There is nothing that has made the tremor improve.  It can be worse when she is anxious.  Patient has noticed that she drools on occasion.  She has noticed no difficulties with ambulation such as shuffling or loss of balance.  She does not have difficulty arising from a chair.  There is no history of neurologic disease in her family.  There has been no tremor or Parkinson's disease in her parents or siblings.  Patient currently lives in a group home because of her mental health issues.  She has been on several medications for her schizophrenia and bipolar disorder and her mental health professional has been trying to reduce some of these medications.  She had been on Depakote which was stopped approximately 2 months ago.  She did not notice an improvement once that medication was withdrawn.  She is currently taking  Invega,Pristiq and Saphris.    Past Medical History:   Diagnosis Date   ??? Asthma    ??? Diabetes mellitus (HCC)        Past Surgical History:   Procedure Laterality Date   ??? NASAL SEPTUM SURGERY Bilateral    ??? PR COLON CA SCRN NOT HI [facility] IND  01/04/2016    COLONOSCOPY performed by Link Snuffer, MD at [facility]   ??? PR COLON CA SCRN NOT HI [facility] IND N/A 02/10/2016    COLONOSCOPY; incomplete; aborted due to poor prep performed by Link Snuffer, MD at [facility]   ???  PR ESOPHAGOGASTRODUODENOSCOPY TRANSORAL DIAGNOSTIC N/A 01/04/2016    EGD ESOPHAGOGASTRODUODENOSCOPY performed by Link SnufferSyed Uzair T Hamdani, MD at [facility]       Family History   Problem Relation Age of Onset   ??? High Blood Pressure Mother    ??? High Blood Pressure Father        Social History     Social History   ??? Marital status: Single     Spouse name: N/A   ??? Number of children: N/A   ??? Years of education: N/A     Social History Main Topics   ??? Smoking status: Current Every Day Smoker   ??? Smokeless tobacco: Never Used   ??? Alcohol use No   ??? Drug use: No   ??? Sexual activity: Not on file     Other Topics Concern   ??? Not on file     Social History Narrative   ??? No narrative on file       Current Outpatient Prescriptions   Medication Sig Dispense Refill   ??? paliperidone palmitate (INVEGA SUSTENNA) 234 MG/1.5ML SUSP IM injection Inject 234 mg into the muscle every 30 days     ??? desvenlafaxine succinate (PRISTIQ) 50 MG TB24 extended release tablet Take 50 mg by mouth     ??? fluticasone (FLONASE) 50 MCG/ACT nasal spray 1 spray by Nasal route daily     ??? montelukast (SINGULAIR) 10 MG tablet Take 10 mg by mouth nightly     ??? esomeprazole Magnesium (NEXIUM) 40 MG PACK Take 40 mg by mouth daily     ??? senna-docusate (PERICOLACE) 8.6-50 MG per tablet Take 1 tablet by mouth daily     ??? atorvastatin (LIPITOR) 20 MG tablet Take 20 mg by mouth daily     ??? benztropine (COGENTIN) 0.5 MG tablet Take 0.5 mg by mouth daily     ??? LORazepam (ATIVAN) 1 MG tablet  Take 1 mg by mouth every 8 hours as needed for Anxiety     ??? metFORMIN (GLUCOPHAGE) 500 MG tablet Take 500 mg by mouth 2 times daily (with meals)     ??? hydrOXYzine (VISTARIL) 25 MG capsule Take 25 mg by mouth 3 times daily as needed for Itching     ??? lisinopril (PRINIVIL;ZESTRIL) 5 MG tablet Take 5 mg by mouth daily       No current facility-administered medications for this visit.        No Known Allergies         REVIEW OF SYSTEMS    CONSTITUTIONAL Weight: absent, Appetite: absent, Fatigue: absent      HEENT Ears: normal, Visual disturbance: absent   RESPIRATORY Shortness of breath: absent, Cough: absent   CARDIOVASCULAR Chest pain: absent, Leg swelling :absent      GI Constipation: absent, Diarrhea: absent, Swallowing change: absent      GU Urinary frequency: absent, Urinary urgency: absent, Urinary incontinence: absent   MUSCULOSKELETAL Neck pain: absent, Back pain: absent, Stiffness: absent, Muscle pain: absent, Joint pain: absent Restless legs: absent   DERMATOLOGIC Hair loss: absent, Skin changes: absent   NEUROLOGIC Memory loss: absent, Confusion: absent, Seizures: absent Trouble walking or imbalance: absent, Dizziness: absent, Weakness: absent, Numbness: absent Tremor: present, Spasm: absent, Speech difficulty: absent, Headache: absent, Light sensitivity: absent   PSYCHIATRIC Anxiety: present, Hallucination: absent, Mood disorder: present, depression   HEMATOLOGIC Abnormal bleeding: absent, Anemia: absent, Clotting disorder: absent, Lymph gland changes: absent       There were no vitals filed for  this visit.  Admission weight:                                             .                                                                                                    General Appearance:  Alert, cooperative, no signs of distress, appears stated age   Head:  Normocephalic, no signs of trauma   Eyes:  Conjunctiva/corneas clear;  eyelids intact   Ears:  Normal external ear and canals   Nose: Nares normal,  mucosa normal, no drainage    Throat: Lips and tongue normal; teeth normal;  gums normal   Neck: Supple, intact flexion, extension and rotation;   trachea midline;  no adenopathy;   thyroid: not enlarged;   no carotid pulse abnormality   Back:   Symmetric, no curvature, ROM adequate   Lungs:   Respirations unlabored   Chest Wall:  No deformity   Heart:  Regular rate and rhythm                 Extremities: Extremities normal, no cyanosis, no edema   Pulses: Symmetric over head and neck   Skin: Skin color, texture normal, no rashes, no lesions               Neurological Examination    Neurologic Exam     Mental Status   Oriented to person, place, and time.   Attention: normal.   Speech: speech is normal   Level of consciousness: alert  Normal comprehension.     Cranial Nerves     CN II   Visual fields full to confrontation.     CN III, IV, VI   Pupils are equal, round, and reactive to light.  Extraocular motions are normal.     CN V   Facial sensation intact.     CN VII   Facial expression full, symmetric.     CN VIII   CN VIII normal.     CN IX, X   CN IX normal.     CN XI   CN XI normal.     CN XII   CN XII normal.     Motor Exam   Muscle bulk: normal  Overall muscle tone: normal  Right arm tone: cogwheel rigidity  Left arm tone: cogwheel rigidity  Right arm pronator drift: absent  Right leg tone: normal  Left leg tone: normal    Strength   Strength 5/5 throughout.     Sensory Exam   Light touch normal.   Vibration normal.   Pinprick normal.     Gait, Coordination, and Reflexes     Gait  Gait: normal    Coordination   Romberg: negative  Finger to nose coordination: normal    Tremor   Resting tremor: present  Intention tremor: absent  Action tremor: absent    Reflexes  Right brachioradialis: 2+  Left brachioradialis: 2+  Right biceps: 2+  Left biceps: 2+  Right triceps: 2+  Left triceps: 2+  Right patellar: 2+  Left patellar: 2+  Right achilles: 2+  Left achilles: 2+  Right plantar: normal  Left plantar:  normalResting tremor is present in bilateral upper extremities more so on the left than the right.  Tremor disappears with posture.  There is tremor during ambulation more so on the left than the right.  And there is mild tremor of the left lower extremity.  No focal tremor nor tremor of her head during this evaluation.               Assessment/Plan: :    Patient presents with bilateral tremor which is most pronounced at rest.  She also displays symptoms of masklike face, cogwheel rigidity, and bradykinesia.  There is no postural instability or shuffling gait.  Her symptoms appear to be parkinsonian, whether they are primary Parkinson's disease or Parkinsonism, which is exacerbated by her psychiatric medications. We will obtain an MRI of the brain to rule out any structural abnormality and a DATscan for a definitive Parkinson's diagnosis.  I will see the patient back in 3 weeks, once the diagnostic testing can be completed.  We will not initiate treatment until that time.  I discussed treatment options with the patient.  At her age, we would hold treatment with Sinemet and try, rather, a dopamine agonist such as Mirapex or Sinemet.    Thank you for allowing me to participate in the neurologic care of Ms. Encarnacion.         Signed: Darcella Cheshire, CNP

## 2016-02-29 NOTE — Telephone Encounter (Signed)
11 20 2017 visit note and encounter faxed to Donaciano EvaWendy Goodrich CNP at [phone] and Drexel IhaMichelle Ducat APN at [phone].  KS

## 2016-03-07 NOTE — Telephone Encounter (Signed)
[name] called in and lm he is with [facility] and needed to talk about Shannon Allison . I called back and he had left for the day. Will call him in the a.m.

## 2016-03-08 MED ORDER — DIAZEPAM 2 MG PO TABS
2 MG | ORAL_TABLET | ORAL | 0 refills | Status: DC
Start: 2016-03-08 — End: 2016-04-24

## 2016-03-08 NOTE — Telephone Encounter (Signed)
I agree wean down and stop prior to the study

## 2016-03-08 NOTE — Telephone Encounter (Signed)
I called and spoke with Trinna PostAlex.  He said that her medication list shows Cogentin. That shows potential interference with the nuclear med. they use for DaT Scan. They wanted to know if she can either stop it or wean down on it and then stop it 5 days prior or have the test done with her still on it.

## 2016-03-08 NOTE — Telephone Encounter (Signed)
Elnita Maxwellheryl, I sent this in error to Dr. Sherlene ShamsZangara. Please read the message and let me know your thoughts about the Cogentin.

## 2016-03-08 NOTE — Telephone Encounter (Signed)
I would ask Elnita MaxwellCheryl since she saw the patient but I think the patient probably hold the Cogentin for the few days needed to get the DAT scan.

## 2016-03-09 NOTE — Telephone Encounter (Signed)
Yes, she is only on 0.5 mg.  She may then re-start after the test

## 2016-03-09 NOTE — Telephone Encounter (Signed)
I called Shannon Allison and she is agreeable. She said she just takes 1 at bedtime. Is it okay then for her to just stop it the 5 days prior when they get her scheduled?

## 2016-03-09 NOTE — Telephone Encounter (Signed)
I called Shannon Allison and gave her this information and told her I am [facility] but they haven't answered

## 2016-03-13 NOTE — Telephone Encounter (Signed)
I called This a.m. still not picking up.  I was able to get [employer] Group[employer] of [name] and he said he will contact Lanora ManisElizabeth and get her scheduled.

## 2016-03-23 ENCOUNTER — Encounter: Attending: Adult Health | Primary: Nurse Practitioner

## 2016-03-29 NOTE — Telephone Encounter (Signed)
Reprinted lab order and mailed to patient.  KS

## 2016-04-05 ENCOUNTER — Encounter

## 2016-04-24 ENCOUNTER — Ambulatory Visit: Admit: 2016-04-24 | Discharge: 2016-04-24 | Payer: MEDICARE | Attending: Adult Health | Primary: Nurse Practitioner

## 2016-04-24 DIAGNOSIS — G20 Parkinson's disease: Secondary | ICD-10-CM

## 2016-04-24 MED ORDER — ROPINIROLE HCL 0.25 MG PO TABS
0.25 MG | ORAL_TABLET | ORAL | 1 refills | Status: DC
Start: 2016-04-24 — End: 2016-06-14

## 2016-04-24 NOTE — Progress Notes (Signed)
??  [facility]  [address]  [place], [place] [address]  Dept: [phone]  Dept Fax: [phone]  ??                         Teodoro Kil, MD                          Roanna Epley, MD               Magda Paganini, MD                                 Ahmed B. Georgetta Haber, MD                           Jacqlyn Krauss, MD                            Darcella Cheshire, CNP      04/24/2016    HPI:      Your patient, Shannon Allison returns for continuing neurologic care.  Patient is a 56 year old woman seen last on February 28, 2016 for evaluation of tremor.  Patient is a left-handed woman with past medical history significant for diabetes mellitus type 2 hypertension, dyslipidemia, bipolar disorder, and schizophrenia.  Patient currently lives in a group home because of her schizophrenia.  Is primarily in her left hand, mainly at rest and during ambulation.  She does have some tremor in her right upper extremity, however it does not affect her as much as the left.  Patient has noticed no difficulties with ambulation such as shuffling or loss of balance, however when she ambulates during exam she does have a shuffling type gait.  There is no history of neurologic disease in her family, specifically no history of tremor or Parkinson's disease in her parents or siblings.  Patient had been on Depakote, which was stopped primarily due to the tremor, which did not cause the tremor to improve.  Laboratory studies were completed including an MRI of the brain which was normal.  She did have an abnormal DaTscan showing decreased uptake in the left putamen compared to the right.  She was also to have a vitamin B12 level however this was done at [facility] in the records did not transfer to [place].    Past Medical History:   Diagnosis Date   ??? Asthma    ??? Diabetes mellitus (HCC)          Past Surgical History:   Procedure Laterality Date   ??? NASAL SEPTUM SURGERY Bilateral    ??? PR COLON CA SCRN NOT HI  [facility] IND  01/04/2016    COLONOSCOPY performed by Link Snuffer, MD at [facility]   ??? PR COLON CA SCRN NOT HI [facility] IND N/A 02/10/2016    COLONOSCOPY; incomplete; aborted due to poor prep performed by Link Snuffer, MD at [facility]   ??? PR ESOPHAGOGASTRODUODENOSCOPY TRANSORAL DIAGNOSTIC N/A 01/04/2016    EGD ESOPHAGOGASTRODUODENOSCOPY performed by Link Snuffer, MD at [facility]       Family History   Problem Relation Age of Onset   ??? High Blood Pressure Mother    ??? High Blood Pressure Father  Social History   Substance Use Topics   ??? Smoking status: Current Every Day Smoker     Packs/day: 0.50   ??? Smokeless tobacco: Never Used   ??? Alcohol use No                               REVIEW OF SYSTEMS    CONSTITUTIONAL Weight: absent, Appetite: absent, Fatigue: absent      HEENT Ears: normal, Visual disturbance: absent   RESPIRATORY Shortness of breath: absent, Cough: present   CARDIOVASCULAR Chest pain: absent, Leg swelling :absent      GI Constipation: absent, Diarrhea: absent, Swallowing change: absent      GU Urinary frequency: absent, Urinary urgency: absent, Urinary incontinence: absent   MUSCULOSKELETAL Neck pain: absent, Back pain: absent, Stiffness: absent, Muscle pain: absent, Joint pain: absent Restless legs: absent   DERMATOLOGIC Hair loss: absent, Skin changes: absent   NEUROLOGIC Memory loss: absent, Confusion: absent, Seizures: absent Trouble walking or imbalance: absent, Dizziness: absent, Weakness: absent, Numbness: absent Tremor: absent, Spasm: absent, Speech difficulty: absent, Headache: absent, Light sensitivity: absent   PSYCHIATRIC Anxiety: absent, Hallucination: absent, Mood disorder: present   HEMATOLOGIC Abnormal bleeding: absent, Anemia: absent, Clotting disorder: absent, Lymph gland changes: absent           No Known Allergies        Current Outpatient Prescriptions   Medication Sig Dispense Refill   ??? folic acid (FOLVITE) 1 MG tablet Take 1 mg by mouth daily     ???  famotidine (PEPCID) 20 MG tablet Take 20 mg by mouth 2 times daily as needed     ??? polyethylene glycol (GLYCOLAX) packet Take 17 g by mouth daily as needed for Constipation     ??? paliperidone palmitate (INVEGA SUSTENNA) 234 MG/1.5ML SUSP IM injection Inject 234 mg into the muscle every 30 days     ??? desvenlafaxine succinate (PRISTIQ) 50 MG TB24 extended release tablet Take 50 mg by mouth     ??? fluticasone (FLONASE) 50 MCG/ACT nasal spray 1 spray by Nasal route daily     ??? montelukast (SINGULAIR) 10 MG tablet Take 10 mg by mouth nightly     ??? esomeprazole Magnesium (NEXIUM) 40 MG PACK Take 40 mg by mouth daily     ??? senna-docusate (PERICOLACE) 8.6-50 MG per tablet Take 1 tablet by mouth daily     ??? atorvastatin (LIPITOR) 20 MG tablet Take 20 mg by mouth daily     ??? benztropine (COGENTIN) 0.5 MG tablet Take 0.5 mg by mouth daily     ??? LORazepam (ATIVAN) 1 MG tablet Take 1 mg by mouth every 8 hours as needed for Anxiety     ??? metFORMIN (GLUCOPHAGE) 500 MG tablet Take 500 mg by mouth 2 times daily (with meals)     ??? hydrOXYzine (VISTARIL) 25 MG capsule Take 25 mg by mouth 3 times daily as needed for Itching     ??? lisinopril (PRINIVIL;ZESTRIL) 5 MG tablet Take 5 mg by mouth daily       No current facility-administered medications for this visit.                                         PHYSICAL EXAMINATION       There were no vitals filed for this visit.                                           Marland Kitchen  General Appearance:  Alert, cooperative, no signs of distress, appears stated age   Head:  Normocephalic, no signs of trauma   Eyes:  Conjunctiva/corneas clear;  eyelids intact   Ears:  Normal external ear and canals   Nose: Nares normal, mucosa normal, no drainage    Throat: Lips and tongue normal; teeth normal;  gums normal   Neck: Supple, intact flexion, extension and rotation;   trachea midline;  no adenopathy;   thyroid: not  enlarged;   no carotid pulse abnormality   Back:   Symmetric, no curvature, ROM adequate   Lungs:   Respirations unlabored   Heart:  Regular rate and rhythm           Extremities: Extremities normal, no cyanosis, no edema   Pulses: Symmetric over head and neck   Skin: Skin color, texture normal, no rashes, no lesions                                             NEUROLOGIC EXAMINATION    Neurologic Exam  Neurologic Exam   ??  Mental Status   Oriented to person, place, and time.   Attention: normal.   Speech: speech is normal   Level of consciousness: alert  Normal comprehension.   ??  Cranial Nerves   ??  CN II   Visual fields full to confrontation.   ??  CN III, IV, VI   Pupils are equal, round, and reactive to light.  Extraocular motions are normal.   ??  CN V   Facial sensation intact.   ??  CN VII   Facial expression full, symmetric.   ??  CN VIII   CN VIII normal.   ??  CN IX, X   CN IX normal.   ??  CN XI   CN XI normal.   ??  CN XII   CN XII normal.   ??  Motor Exam   Muscle bulk: normal  Overall muscle tone: normal  Right arm tone: cogwheel rigidity  Left arm tone: cogwheel rigidity  Right arm pronator drift: absent  Right leg tone: normal  Left leg tone: normal  ??  Strength   Strength 5/5 throughout.   ??  Sensory Exam   Light touch normal.   Vibration normal.   Pinprick normal.   ??  Gait, Coordination, and Reflexes   ??  Gait  Gait: normal  ??  Coordination   Romberg: negative  Finger to nose coordination: normal  ??  Tremor   Resting tremor: present  Intention tremor: absent  Action tremor: absent  ??  Reflexes   Right brachioradialis: 2+  Left brachioradialis: 2+  Right biceps: 2+  Left biceps: 2+  Right triceps: 2+  Left triceps: 2+  Right patellar: 2+  Left patellar: 2+  Right achilles: 2+  Left achilles: 2+  Right plantar: normal  Left plantar: normalResting tremor is present in bilateral upper extremities more so on the left than the right.  Tremor disappears with posture.  There is tremor during ambulation more so on  the left than the right.  And there is mild tremor of the left lower extremity.  No focal tremor nor tremor of her head during this evaluation.           ASSESSMENT/PLAN:       In summary, your patient, Marlen Koman  Wendall StadeKohl exhibits the following, with associated plan:    1. Parkinson's disease, with bilateral rest tremor, most exaggerated on the left, with masklike face, cogwheel rigidity, shuffling gait, and bradykinesia.  There is no postural instability. + DaTscan  1. Patient will start Requip 0.25 mg 3 times daily for 2 weeks, then increase to 0.5 mg 3 times daily.  2. She will return in follow-up in 4 weeks for evaluation of her response to Requip.  3. I spoke to her mother in detail over the telephone to update her on the patient's condition.  4. Patient verbalizes understanding of her medication regimen and I wrote it on a note pad for her to return to her group home            Signed: Darcella Cheshireheryl Arhianna Ebey, CNP

## 2016-04-25 NOTE — Telephone Encounter (Signed)
01 15 2018 visit note and encounter faxed to Donaciano Eva CNP at [phone].  KS

## 2016-05-30 ENCOUNTER — Encounter: Attending: Adult Health | Primary: Nurse Practitioner

## 2016-06-12 ENCOUNTER — Inpatient Hospital Stay: Payer: MEDICARE | Primary: Nurse Practitioner

## 2016-06-12 ENCOUNTER — Encounter

## 2016-06-12 ENCOUNTER — Inpatient Hospital Stay: Admit: 2016-06-12 | Payer: MEDICARE | Primary: Nurse Practitioner

## 2016-06-12 DIAGNOSIS — R059 Cough, unspecified: Secondary | ICD-10-CM

## 2016-06-12 DIAGNOSIS — R05 Cough: Secondary | ICD-10-CM

## 2016-06-14 ENCOUNTER — Ambulatory Visit: Admit: 2016-06-14 | Discharge: 2016-06-14 | Payer: MEDICARE | Attending: Adult Health | Primary: Nurse Practitioner

## 2016-06-14 DIAGNOSIS — G20 Parkinson's disease: Secondary | ICD-10-CM

## 2016-06-14 MED ORDER — ROPINIROLE HCL 2 MG PO TABS
2 MG | ORAL_TABLET | Freq: Three times a day (TID) | ORAL | 3 refills | Status: DC
Start: 2016-06-14 — End: 2016-10-18

## 2016-06-14 MED ORDER — ROPINIROLE HCL 1 MG PO TABS
1 MG | ORAL_TABLET | Freq: Three times a day (TID) | ORAL | 0 refills | Status: DC
Start: 2016-06-14 — End: 2016-09-28

## 2016-06-14 MED ORDER — ROPINIROLE HCL 0.25 MG PO TABS
0.25 | ORAL_TABLET | ORAL | 0 refills | Status: DC
Start: 2016-06-14 — End: 2016-09-28

## 2016-06-14 NOTE — Progress Notes (Signed)
[facility]            [address], [address]          [place], [place] [id number]          Dept: [phone]          Dept Fax: [phone]        E. Roanna Epleyomas Calderon, MD           Jacqlyn Kraussarmela Gonzales, MD         Ahmed B. Georgetta HaberArshad, MD                   Magda BernheimLingling Rong, MD              Magda PaganiniSam Chodisetty, MD                Darcella Cheshireheryl Lajuanda Penick, CNP       ??    06/14/2016    HPI:      Your patient, Shannon Allison returns for continuing neurologic care.  Patient is a 56 year old woman who was seen last on April 24, 2016 for treatment of Parkinson's disease.  Patient is a left-handed woman with past medical history significant for diabetes mellitus type 2, hypertension, dyslipidemia, bipolar disorder, and schizophrenia.  She currently lives at group in a group home because of her schizophrenia and is accompanied by a case Production designer, theatre/television/filmmanager.  Patient has tremor mainly in her left hand more than her right which is at rest and during ambulation.  It does affect her activities of daily living.  She also has a shuffling gait during ambulation, cogwheel rigidity, masklike face, and bradykinesia.  There is no postural instability..  Patient had a DAT scan which showed decreased uptake in her left putamen compared to the right.  At her last visit, she was started on Requip 0.25 mg 3 times daily for 2 weeks, then 0.5 mg 3 times daily.  She is here today for follow-up noting that there is some slight improvement in her tremor, but not significantly.  She is somewhat happy with the results however because she still does see some improvement.  Patient denies headaches, blurred or double vision, weakness numbness or tingling in her extremities, gait or balance difficulties.  She reports no side effects to the medication    Past Medical History:   Diagnosis Date   ??? Asthma    ??? Diabetes mellitus (HCC)          Past Surgical History:   Procedure Laterality Date   ??? NASAL SEPTUM SURGERY Bilateral    ??? PR COLON CA SCRN NOT HI RSK IND   01/04/2016    COLONOSCOPY performed by Link SnufferSyed Uzair T Hamdani, MD at [facility]   ??? PR COLON CA SCRN NOT HI RSK IND N/A 02/10/2016    COLONOSCOPY; incomplete; aborted due to poor prep performed by Link SnufferSyed Uzair T Hamdani, MD at [facility]   ??? PR ESOPHAGOGASTRODUODENOSCOPY TRANSORAL DIAGNOSTIC N/A 01/04/2016    EGD ESOPHAGOGASTRODUODENOSCOPY performed by Link SnufferSyed Uzair T Hamdani, MD at [facility]       Family History   Problem Relation Age of Onset   ??? High Blood Pressure Mother    ??? High Blood Pressure Father        Social History   Substance Use Topics   ??? Smoking status: Current Every Day Smoker     Packs/day: 0.50   ??? Smokeless tobacco: Never Used   ??? Alcohol use No  REVIEW OF SYSTEMS    CONSTITUTIONAL Weight: absent, Appetite: absent, Fatigue: absent      HEENT Ears: normal, Visual disturbance: absent   RESPIRATORY Shortness of breath: absent, Cough: present   CARDIOVASCULAR Chest pain: absent, Leg swelling :absent      GI Constipation: present, Diarrhea: absent, Swallowing change: absent      GU Urinary frequency: absent, Urinary urgency: absent   MUSCULOSKELETAL Neck pain: absent, Back pain: absent, Stiffness: absent, Muscle pain: absent, Joint pain: absent Restless legs: absent   DERMATOLOGIC Hair loss: absent, Skin changes: absent   NEUROLOGIC Memory loss: absent, Confusion: present, Seizures: absent Trouble walking or imbalance: absent, Dizziness: absent, Weakness: absent, Numbness: absent Tremor: present, Spasm: absent, Speech difficulty: absent, Headache: absent, Light sensitivity: absent   PSYCHIATRIC Anxiety: present, Hallucination: present, Mood disorder: present depression   HEMATOLOGIC Abnormal bleeding: absent, Anemia: absent, Clotting disorder: absent, Lymph gland changes: absent           No Known Allergies        Current Outpatient Prescriptions   Medication Sig Dispense Refill   ??? desvenlafaxine succinate (PRISTIQ) 100 MG TB24 extended release tablet Take by mouth  daily     ??? rOPINIRole (REQUIP) 0.25 MG tablet 3 tablets by mouth TID for one week 63 tablet 0   ??? [START ON 06/21/2016] rOPINIRole (REQUIP) 1 MG tablet Take 1 tablet by mouth 3 times daily For one week, then 1 and 1/2 tablets TID for one week 32 tablet 0   ??? [START ON 07/12/2016] rOPINIRole (REQUIP) 2 MG tablet Take 1 tablet by mouth 3 times daily 90 tablet 3   ??? folic acid (FOLVITE) 1 MG tablet Take 1 mg by mouth daily     ??? famotidine (PEPCID) 20 MG tablet Take 20 mg by mouth 2 times daily as needed     ??? polyethylene glycol (GLYCOLAX) packet Take 17 g by mouth daily as needed for Constipation     ??? paliperidone palmitate (INVEGA SUSTENNA) 234 MG/1.5ML SUSP IM injection Inject 234 mg into the muscle every 30 days     ??? fluticasone (FLONASE) 50 MCG/ACT nasal spray 1 spray by Nasal route daily     ??? montelukast (SINGULAIR) 10 MG tablet Take 10 mg by mouth nightly     ??? esomeprazole Magnesium (NEXIUM) 40 MG PACK Take 40 mg by mouth daily     ??? senna-docusate (PERICOLACE) 8.6-50 MG per tablet Take 1 tablet by mouth daily     ??? atorvastatin (LIPITOR) 20 MG tablet Take 20 mg by mouth daily     ??? benztropine (COGENTIN) 0.5 MG tablet Take 0.5 mg by mouth daily     ??? LORazepam (ATIVAN) 1 MG tablet Take 1 mg by mouth every 8 hours as needed for Anxiety     ??? metFORMIN (GLUCOPHAGE) 500 MG tablet Take 500 mg by mouth 2 times daily (with meals)     ??? hydrOXYzine (VISTARIL) 25 MG capsule Take 25 mg by mouth 3 times daily as needed for Itching     ??? lisinopril (PRINIVIL;ZESTRIL) 5 MG tablet Take 5 mg by mouth daily       No current facility-administered medications for this visit.                                         PHYSICAL EXAMINATION       BP 107/75 (Site: Left Arm,  Position: Sitting)    Pulse 114    Ht 5\' 9"  (1.753 m)    Wt 222 lb (100.7 kg)    BMI 32.78 kg/m??                                             .                                                                                                      General  Appearance:  Alert, cooperative, no signs of distress, appears stated age   Head:  Normocephalic, no signs of trauma   Eyes:  Conjunctiva/corneas clear;  eyelids intact   Ears:  Normal external ear and canals   Nose: Nares normal, mucosa normal, no drainage    Throat: Lips and tongue normal; teeth normal;  gums normal   Neck: Supple, intact flexion, extension and rotation;   trachea midline;  no adenopathy;   thyroid: not enlarged;   no carotid pulse abnormality   Back:   Symmetric, no curvature, ROM adequate   Lungs:   Respirations unlabored   Heart:  Regular rate and rhythm           Extremities: Extremities normal, no cyanosis, no edema   Pulses: Symmetric over head and neck   Skin: Skin color, texture normal, no rashes, no lesions                                             NEUROLOGIC EXAMINATION    Neurologic Exam     Mental Status   Oriented to person, place, and time.   Attention: normal.   Speech: speech is normal   Level of consciousness: alert  Normal comprehension.     Cranial Nerves     CN II   Visual fields full to confrontation.     CN III, IV, VI   Pupils are equal, round, and reactive to light.  Extraocular motions are normal.     CN V   Facial sensation intact.     CN VII   Facial expression full, symmetric.     CN VIII   CN VIII normal.     CN IX, X   CN IX normal.     CN XI   CN XI normal.     CN XII   CN XII normal.     Motor Exam   Muscle bulk: normal  Overall muscle tone: normal  Right arm tone: normal and cogwheel rigidity  Left arm tone: normal and cogwheel rigidity  Right arm pronator drift: absent  Left arm pronator drift: absent  Right leg tone: normal  Left leg tone: normal    Strength   Strength 5/5 throughout.     Sensory Exam   Light touch normal.   Vibration normal.   Pinprick  normal.     Gait, Coordination, and Reflexes     Gait  Gait: shuffling    Coordination   Finger to nose coordination: normal    Tremor   Resting tremor: present    Reflexes   Right brachioradialis: 2+  Left  brachioradialis: 2+  Right biceps: 2+  Left biceps: 2+  Right triceps: 2+  Left triceps: 2+  Right patellar: 2+  Left patellar: 2+  Right achilles: 2+  Left achilles: 2+            ASSESSMENT/PLAN:       In summary, your patient, TIMIYAH ROMITO exhibits the following, with associated plan:    1. Parkinson's disease with positive DAT scan  1. Titrate Requip as directed  1. 0.75 mg 3 times daily for 1 week  2. 1 mg 3 times daily for 1 week  3. 1.5 mg 3 times daily for 1 week  4. Then 2 mg 3 times daily  and return for follow-up in 6-7 weeks for further evaluation.            Signed: Darcella Cheshire, CNP

## 2016-07-10 ENCOUNTER — Inpatient Hospital Stay
Admit: 2016-07-10 | Discharge: 2016-07-10 | Disposition: A | Discharge status: Home / Self Care | Payer: MEDICARE | Attending: Emergency Medicine

## 2016-07-10 ENCOUNTER — Emergency Department: Admit: 2016-07-10 | Payer: MEDICARE | Primary: Nurse Practitioner

## 2016-07-10 DIAGNOSIS — H66002 Acute suppurative otitis media without spontaneous rupture of ear drum, left ear: Secondary | ICD-10-CM

## 2016-07-10 MED ORDER — GUAIFENESIN ER 600 MG PO TB12
600 MG | Freq: Once | ORAL | Status: DC
Start: 2016-07-10 — End: 2016-07-10

## 2016-07-10 MED ORDER — AMOXICILLIN 250 MG PO CAPS
250 MG | Freq: Once | ORAL | Status: AC
Start: 2016-07-10 — End: 2016-07-10
  Administered 2016-07-10: 18:00:00 500 mg via ORAL

## 2016-07-10 MED ORDER — BENZONATATE 100 MG PO CAPS
100 MG | ORAL_CAPSULE | Freq: Three times a day (TID) | ORAL | 0 refills | Status: AC | PRN
Start: 2016-07-10 — End: 2016-07-17

## 2016-07-10 MED ORDER — PSEUDOEPHEDRINE-GUAIFENESIN ER 60-600 MG PO TB12
60-600 MG | ORAL_TABLET | Freq: Two times a day (BID) | ORAL | 0 refills | Status: AC
Start: 2016-07-10 — End: 2016-07-17

## 2016-07-10 MED ORDER — AMOXICILLIN 500 MG PO CAPS
500 MG | ORAL_CAPSULE | Freq: Two times a day (BID) | ORAL | 0 refills | Status: AC
Start: 2016-07-10 — End: 2016-07-20

## 2016-07-10 MED ORDER — BENZONATATE 100 MG PO CAPS
100 MG | Freq: Once | ORAL | Status: AC
Start: 2016-07-10 — End: 2016-07-10
  Administered 2016-07-10: 17:00:00 100 mg via ORAL

## 2016-07-10 MED FILL — AMOXICILLIN 250 MG PO CAPS: 250 MG | ORAL | Qty: 2

## 2016-07-10 MED FILL — BENZONATATE 100 MG PO CAPS: 100 MG | ORAL | Qty: 1

## 2016-07-10 NOTE — ED Provider Notes (Signed)
[facility] ED  Emergency Department Encounter  Emergency Medicine Resident     Pt Name: Shannon Allison  MRN: [id number]  Birthdate 03/10/1961  Date of evaluation: 07/10/16  PCP:  Anise Salvo, CNP    CHIEF COMPLAINT       No chief complaint on file.      HISTORY OF PRESENT ILLNESS  (Location/Symptom, Timing/Onset, Context/Setting, Quality, Duration, Modifying Factors, Severity.)      Shannon Allison is a 56 y.o. female who presents with Sinus pressure, cough, congestion, left earache for the past 2 days.  Patient states she thinks she may have a sinus infection.  Patient denies any abdominal pain, nausea, vomiting, diarrhea, constipation.  Patient has history of parkinsonism.  Patient states she is always tachycardic.  Upon chart review patient was tachycardic during her last visit to neurologist.  Patient denies fever, chills, chest pain, shortness breath, weakness, numbness.     PAST MEDICAL / SURGICAL / SOCIAL / FAMILY HISTORY      has a past medical history of Asthma and Diabetes mellitus (HCC).     has a past surgical history that includes Nasal septum surgery (Bilateral); pr esophagogastroduodenoscopy transoral diagnostic (N/A, 01/04/2016); pr colon ca scrn not hi rsk ind (01/04/2016); and pr colon ca scrn not hi rsk ind (N/A, 02/10/2016).    Social History     Social History   ??? Marital status: Single     Spouse name: N/A   ??? Number of children: N/A   ??? Years of education: N/A     Occupational History   ??? Not on file.     Social History Main Topics   ??? Smoking status: Current Every Day Smoker     Packs/day: 0.50   ??? Smokeless tobacco: Never Used   ??? Alcohol use No   ??? Drug use: No   ??? Sexual activity: Not on file     Other Topics Concern   ??? Not on file     Social History Narrative   ??? No narrative on file       Family History   Problem Relation Age of Onset   ??? High Blood Pressure Mother    ??? High Blood Pressure Father        Allergies:  Patient has no known allergies.    Home  Medications:  Prior to Admission medications    Medication Sig Start Date End Date Taking? Authorizing Provider   amoxicillin (AMOXIL) 500 MG capsule Take 1 capsule by mouth 2 times daily for 10 days 07/10/16 07/20/16 Yes Dannette Barbara, DO   pseudoephedrine-guaiFENesin (MUCINEX D) 60-600 MG per extended release tablet Take 1 tablet by mouth every 12 hours for 7 days 07/10/16 07/17/16 Yes Dannette Barbara, DO   benzonatate (TESSALON PERLES) 100 MG capsule Take 1 capsule by mouth 3 times daily as needed for Cough 07/10/16 07/17/16 Yes Faven Watterson A Yuridia Couts, DO   desvenlafaxine succinate (PRISTIQ) 100 MG TB24 extended release tablet Take by mouth daily    Historical Provider, MD   rOPINIRole (REQUIP) 0.25 MG tablet 3 tablets by mouth TID for one week 06/14/16   Foy Guadalajara, CNP   rOPINIRole (REQUIP) 1 MG tablet Take 1 tablet by mouth 3 times daily For one week, then 1 and 1/2 tablets TID for one week 06/21/16   Foy Guadalajara, CNP   rOPINIRole (REQUIP) 2 MG tablet Take 1 tablet by mouth 3 times daily 07/12/16   Elnita Maxwell  A McGarry, CNP   folic acid (FOLVITE) 1 MG tablet Take 1 mg by mouth daily    Historical Provider, MD   famotidine (PEPCID) 20 MG tablet Take 20 mg by mouth 2 times daily as needed    Historical Provider, MD   polyethylene glycol (GLYCOLAX) packet Take 17 g by mouth daily as needed for Constipation    Historical Provider, MD   paliperidone palmitate (INVEGA SUSTENNA) 234 MG/1.5ML SUSP IM injection Inject 234 mg into the muscle every 30 days    Historical Provider, MD   fluticasone (FLONASE) 50 MCG/ACT nasal spray 1 spray by Nasal route daily    Historical Provider, MD   montelukast (SINGULAIR) 10 MG tablet Take 10 mg by mouth nightly    Historical Provider, MD   esomeprazole Magnesium (NEXIUM) 40 MG PACK Take 40 mg by mouth daily    Historical Provider, MD   senna-docusate (PERICOLACE) 8.6-50 MG per tablet Take 1 tablet by mouth daily    Historical Provider, MD   atorvastatin (LIPITOR) 20 MG tablet Take 20 mg by mouth  daily    Historical Provider, MD   benztropine (COGENTIN) 0.5 MG tablet Take 0.5 mg by mouth daily    Historical Provider, MD   LORazepam (ATIVAN) 1 MG tablet Take 1 mg by mouth every 8 hours as needed for Anxiety    Historical Provider, MD   metFORMIN (GLUCOPHAGE) 500 MG tablet Take 500 mg by mouth 2 times daily (with meals)    Historical Provider, MD   hydrOXYzine (VISTARIL) 25 MG capsule Take 25 mg by mouth 3 times daily as needed for Itching    Historical Provider, MD   lisinopril (PRINIVIL;ZESTRIL) 5 MG tablet Take 5 mg by mouth daily    Historical Provider, MD       REVIEW OF SYSTEMS    (2-9 systems for level 4, 10 or more for level 5)      Constitutional ROS - No recent fevers, No recent chills  Neurological ROS - No Headache, No Syncope  Opthalmologic ROS- No eye pain, No vision changes   ENT ROS - No sore throat, + congestion  Respiratory ROS - + cough, No shortness of breath  Cardiovascular ROS - No chest pain, No palpitations   Gastrointestinal ROS - No abdominal pain, No nausea, No vomiting  Genito-Urinary ROS - No dysuria, No hematuria  Musculoskeletal ROS - No back pain, No neck pain  Dermatological ROS - No wound, No rash      PHYSICAL EXAM   (up to 7 for level 4, 8 or more for level 5)      INITIAL VITALS:   BP 131/86    Pulse 118    Temp 97.9 ??F (36.6 ??C) (Oral)    Resp 18    Ht  (1.753 m)    Wt 222 lb (100.7 kg)    SpO2 99%    BMI 32.78 kg/m??     CONSTITUTIONAL: AOx4, no apparent distress, appears stated age   HEAD: normocephalic, atraumatic, mild tenderness to maxillary sinuses bilateral    EYES: PERRL, EOMI    ENT: moist mucous membranes, uvula midline, Left TM erythematous, opaque, fluid-filled    NECK: supple, symmetric   BACK: symmetric   LUNGS: clear to auscultation bilaterally   CARDIOVASCULAR: regular rate and rhythm, no murmurs, rubs or gallops   ABDOMEN: soft, non-tender, non-distended   GU: deferred     NEUROLOGIC:  MAEx4, no focal sensory or motor deficits  MUSCULOSKELETAL: no  clubbing, cyanosis or edema   SKIN: no exposed rash     DIFFERENTIAL  DIAGNOSIS     PLAN (LABS / IMAGING / EKG):  Orders Placed This Encounter   Procedures   ??? XR CHEST STANDARD (2 VW)       MEDICATIONS ORDERED:  Orders Placed This Encounter   Medications   ??? guaiFENesin (MUCINEX) extended release tablet 600 mg   ??? benzonatate (TESSALON) capsule 100 mg   ??? amoxicillin (AMOXIL) capsule 500 mg   ??? amoxicillin (AMOXIL) 500 MG capsule     Sig: Take 1 capsule by mouth 2 times daily for 10 days     Dispense:  20 capsule     Refill:  0   ??? pseudoephedrine-guaiFENesin (MUCINEX D) 60-600 MG per extended release tablet     Sig: Take 1 tablet by mouth every 12 hours for 7 days     Dispense:  14 tablet     Refill:  0   ??? benzonatate (TESSALON PERLES) 100 MG capsule     Sig: Take 1 capsule by mouth 3 times daily as needed for Cough     Dispense:  30 capsule     Refill:  0       DDX: Sinusitis, otitis media, pneumonia, viral URI    DIAGNOSTIC RESULTS / EMERGENCY DEPARTMENT COURSE / MDM     LABS:  No results found for this visit on 07/10/16.    IMPRESSION: Otitis media    RADIOLOGY:  Xr Chest Standard (2 Vw)    Result Date: 07/10/2016  EXAMINATION: TWO VIEWS OF THE CHEST 07/10/2016 1:28 pm COMPARISON: 06/12/2016 HISTORY: ORDERING SYSTEM PROVIDED HISTORY: productive cough TECHNOLOGIST PROVIDED HISTORY: Reason for exam:->productive cough FINDINGS: Frontal and lateral views of the chest are submitted for review.  The cardiac silhouette is normal in size.  Lung parenchyma is clear without focal airspace consolidation, sizeable pleural effusion, or pneumothorax.  Trachea is midline.  Osseous structures and soft tissues are grossly intact.     No acute cardiopulmonary pathology.     Xr Chest Standard (2 Vw)    Result Date: 06/12/2016  EXAMINATION: TWO VIEWS OF THE CHEST 06/12/2016 9:35 am COMPARISON: None. HISTORY: ORDERING SYSTEM PROVIDED HISTORY: Cough FINDINGS: Frontal and lateral views of the chest are submitted for review.  The cardiac  silhouette is normal in size.  Lung parenchyma is clear without focal airspace consolidation, sizeable pleural effusion, or pneumothorax.  Trachea is midline.  Osseous structures and soft tissues are grossly intact.     No acute cardiopulmonary pathology.     Xr Chest Standard (2 Vw)    Result Date: 07/10/2016  EXAMINATION: TWO VIEWS OF THE CHEST 07/10/2016 1:28 pm COMPARISON: 06/12/2016 HISTORY: ORDERING SYSTEM PROVIDED HISTORY: productive cough TECHNOLOGIST PROVIDED HISTORY: Reason for exam:->productive cough FINDINGS: Frontal and lateral views of the chest are submitted for review.  The cardiac silhouette is normal in size.  Lung parenchyma is clear without focal airspace consolidation, sizeable pleural effusion, or pneumothorax.  Trachea is midline.  Osseous structures and soft tissues are grossly intact.     No acute cardiopulmonary pathology.     Xr Chest Standard (2 Vw)    Result Date: 06/12/2016  EXAMINATION: TWO VIEWS OF THE CHEST 06/12/2016 9:35 am COMPARISON: None. HISTORY: ORDERING SYSTEM PROVIDED HISTORY: Cough FINDINGS: Frontal and lateral views of the chest are submitted for review.  The cardiac silhouette is normal in size.  Lung parenchyma is clear without focal airspace consolidation, sizeable  pleural effusion, or pneumothorax.  Trachea is midline.  Osseous structures and soft tissues are grossly intact.     No acute cardiopulmonary pathology.       EKG      All EKG's are interpreted by the Emergency Department Physician who either signs or Co-signs this chart in the absence of a cardiologist.    EMERGENCY DEPARTMENT COURSE:  56 y.o. female with sinus pain, earache for the past 2 days.  Patient also is coughing congestion.  Chest x-ray reveals no pneumonia.  Left ear appears to be otitis media.  Will treat with amoxicillin.  Instructed to follow-up with PCP.  Okay to be discharged home.  Chest x-ray normal.    PLAN/MEDS:  Orders Placed This Encounter   Procedures   ??? XR CHEST STANDARD (2 VW)       Orders  Placed This Encounter   Medications   ??? guaiFENesin (MUCINEX) extended release tablet 600 mg   ??? benzonatate (TESSALON) capsule 100 mg   ??? amoxicillin (AMOXIL) capsule 500 mg   ??? amoxicillin (AMOXIL) 500 MG capsule     Sig: Take 1 capsule by mouth 2 times daily for 10 days     Dispense:  20 capsule     Refill:  0   ??? pseudoephedrine-guaiFENesin (MUCINEX D) 60-600 MG per extended release tablet     Sig: Take 1 tablet by mouth every 12 hours for 7 days     Dispense:  14 tablet     Refill:  0   ??? benzonatate (TESSALON PERLES) 100 MG capsule     Sig: Take 1 capsule by mouth 3 times daily as needed for Cough     Dispense:  30 capsule     Refill:  0       :        PROCEDURES:  None    CONSULTS:  None    CRITICAL CARE:  None    FINAL IMPRESSION      1. Acute suppurative otitis media of left ear without spontaneous rupture of tympanic membrane, recurrence not specified    2. Viral URI with cough          DISPOSITION / PLAN     DISPOSITION Decision To Discharge 07/10/2016 02:06:32 PM      PATIENT REFERRED TO:  Anise Salvo, CNP  [address]  [place] [address]  [phone]    Call today  for re-eval      DISCHARGE MEDICATIONS:  Discharge Medication List as of 07/10/2016  2:09 PM      START taking these medications    Details   amoxicillin (AMOXIL) 500 MG capsule Take 1 capsule by mouth 2 times daily for 10 days, Disp-20 capsule, R-0Print             Dannette Barbara, DO  Emergency Medicine Resident    Pre-hypertension/Hypertension: You have been informed that you may have pre-hypertension or Hypertension based on a blood pressure reading in the Emergency Department. I recommend you call the primary care provider listed on your discharge instructions or a physician of your choice this week to arrange follow up for further evaluation of possible pre-hypertension or Hypertension.    (Please note that portions of this note were completed with a voice recognition program.  Efforts were made to edit the dictations but  occasionally words are mis-transcribed. Whenever words are used in this note in any gender, they shall be construed as though they were  used in the gender appropriate to the circumstances; and whenever words are used in this note in the singular or plural form, they shall be construed as though they were used in the form appropriate to the circumstances.)       Dannette Barbara, DO  Resident  07/10/16 [phone]

## 2016-07-10 NOTE — ED Notes (Signed)
Pt to xray by wheelchair.      Araceli Bouche, RN  07/10/16 1330

## 2016-07-10 NOTE — Discharge Instructions (Signed)
THANK YOU!!!    From [facility]    On behalf of the Emergency Department staff at [facility], I would like to thank you for giving [facility] the opportunity to address your health care needs and concerns.    We hope that during your visit, our service was delivered in a professional and caring manner. Please keep [facility] in mind as we walk with you down the path to your own personal wellness.

## 2016-07-10 NOTE — ED Provider Notes (Signed)
[facility]  Emergency Department  Faculty Attestation     I performed a history and physical examination of the patient and discussed management with the resident. I reviewed the resident???s note and agree with the documented findings and plan of care. Any areas of disagreement are noted on the chart. I was personally present for the key portions of any procedures. I have documented in the chart those procedures where I was not present during the key portions. I have reviewed the emergency nurses triage note. I agree with the chief complaint, past medical history, past surgical history, allergies, medications, social and family history as documented unless otherwise noted below.    For Physician Assistant/ Nurse Practitioner cases/documentation I have personally evaluated this patient and have completed at least one if not all key elements of the E/M (history, physical exam, and MDM). Additional findings are as noted.      Primary Care Physician:  Anise Salvo, CNP    Screenings:  @    CHIEF COMPLAINT     No chief complaint on file.      RECENT VITALS:   Temp: 97.9 ??F (36.6 ??C),  Pulse: 118, Resp: 18, BP: 131/86    LABS:  Labs Reviewed - No data to display    Radiology  XR CHEST STANDARD (2 VW)   Final Result   No acute cardiopulmonary pathology.               Attending Physician Additional  Notes    Patient has concerns for sinusitis.  She has nasal congestion, coughing, facial pain.  Is also left ear pain.  There is mild sputum but no wheezing or shortness of breath.  No myalgias.  On exam she is tachycardic afebrile.  Lungs are clear.  She has resting tremor from her Parkinson's.  Oropharynx is injected and dry but no exudate or tonsillar enlargement.  Left TM is erythematous and opaque.  Impression is acute otitis media, cough, bronchitis versus pneumonia.  Plan his chest x-ray, decongestants, Tylenol, oral fluids, antibiotics, follow-up.            Kayleen Memos.  Orson Aloe, MD, [facility]  Attending Emergency  Physician                Rudene Anda, MD  07/10/16 [phone]

## 2016-08-07 ENCOUNTER — Encounter: Attending: Adult Health | Primary: Nurse Practitioner

## 2016-08-09 ENCOUNTER — Encounter: Attending: Adult Health | Primary: Nurse Practitioner

## 2016-08-28 ENCOUNTER — Inpatient Hospital Stay: Payer: MEDICARE | Primary: Nurse Practitioner

## 2016-08-28 DIAGNOSIS — Z79899 Other long term (current) drug therapy: Secondary | ICD-10-CM

## 2016-08-28 LAB — CBC WITH AUTO DIFFERENTIAL
Absolute Eos #: 0.09 10*3/uL (ref 0.00–0.44)
Absolute Immature Granulocyte: <0.03 10*3/uL (ref 0.00–0.30)
Absolute Lymph #: 1.24 10*3/uL (ref 1.10–3.70)
Absolute Mono #: 0.55 10*3/uL (ref 0.10–1.20)
Basophils Absolute: <0.03 10*3/uL (ref 0.00–0.20)
Basophils: 0 % (ref 0–2)
Eosinophils %: 1 % (ref 1–4)
Hematocrit: 42.6 % (ref 36.3–47.1)
Hemoglobin: 13.3 g/dL (ref 11.9–15.1)
Immature Granulocytes: 0 %
Lymphocytes: 17 % — ABNORMAL LOW (ref 24–43)
MCH: 27.7 pg (ref 25.2–33.5)
MCHC: 31.2 g/dL (ref 28.4–34.8)
MCV: 88.6 fL (ref 82.6–102.9)
MPV: 10.4 fL (ref 8.1–13.5)
Monocytes: 7 % (ref 3–12)
NRBC Automated: 0 per 100 WBC
Platelets: 271 10*3/uL (ref 138–453)
RBC: 4.81 m/uL (ref 3.95–5.11)
RDW: 13.3 % (ref 11.8–14.4)
Seg Neutrophils: 75 % — ABNORMAL HIGH (ref 36–65)
Segs Absolute: 5.59 10*3/uL (ref 1.50–8.10)
WBC: 7.5 10*3/uL (ref 3.5–11.3)

## 2016-08-28 LAB — LIPID PANEL
Chol/HDL Ratio: 2.8 (ref <5)
Cholesterol: 143 mg/dL (ref <200)
HDL: 51 mg/dL (ref >40)
LDL Cholesterol: 72 mg/dL (ref 0–130)
Triglycerides: 102 mg/dL (ref <150)

## 2016-08-28 LAB — COMPREHENSIVE METABOLIC PANEL
ALT: 14 U/L (ref 5–33)
AST: 11 U/L (ref <32)
Albumin/Globulin Ratio: 1.2 (ref 1.0–2.5)
Albumin: 4 g/dL (ref 3.5–5.2)
Alkaline Phosphatase: 88 U/L (ref 35–104)
Anion Gap: 12 mmol/L (ref 9–17)
BUN: 12 mg/dL (ref 6–20)
CO2: 26 mmol/L (ref 20–31)
Calcium: 8.8 mg/dL (ref 8.6–10.4)
Chloride: 105 mmol/L (ref 98–107)
Creatinine: 0.46 mg/dL — ABNORMAL LOW (ref 0.50–0.90)
GFR African American: >60 mL/min (ref >60)
GFR Non-African American: >60 mL/min (ref >60)
Glucose: 111 mg/dL — ABNORMAL HIGH (ref 70–99)
Potassium: 4.2 mmol/L (ref 3.7–5.3)
Sodium: 143 mmol/L (ref 135–144)
Total Bilirubin: 0.5 mg/dL (ref 0.3–1.2)
Total Protein: 7.3 g/dL (ref 6.4–8.3)

## 2016-08-28 LAB — MICROSCOPIC URINALYSIS
Casts UA: 2 /LPF (ref 0–8)
Epithelial Cells UA: 10 /HPF (ref 0–5)
RBC, UA: 5 /HPF (ref 0–4)
WBC, UA: 5 /HPF (ref 0–5)

## 2016-08-28 LAB — URINALYSIS
Bilirubin Urine: NEGATIVE
Glucose, Ur: NEGATIVE
Nitrite, Urine: NEGATIVE
Protein, UA: NEGATIVE
Specific Gravity, UA: 1.027 (ref 1.005–1.030)
Urine Hgb: NEGATIVE
Urobilinogen, Urine: NORMAL
pH, UA: 5 (ref 5.0–8.0)

## 2016-08-28 LAB — HEMOGLOBIN A1C
Estimated Avg Glucose: 128 mg/dL
Hemoglobin A1C: 6.1 % — ABNORMAL HIGH (ref 4.0–6.0)

## 2016-08-28 LAB — T4, FREE: Thyroxine, Free: 1.41 ng/dL (ref 0.93–1.70)

## 2016-08-28 LAB — TSH: TSH: 0.98 mIU/L (ref 0.30–5.00)

## 2016-08-30 LAB — VITAMIN D 25 HYDROXY: Vit D, 25-Hydroxy: 22.1 ng/mL — ABNORMAL LOW (ref 30.0–100.0)

## 2016-09-20 LAB — MICROSCOPIC URINALYSIS
Epithelial Cells UA: 0 /HPF (ref 0–5)
RBC, UA: 0 /HPF (ref 0–4)
WBC, UA: 10 /HPF (ref 0–5)

## 2016-09-20 LAB — URINALYSIS WITH REFLEX TO CULTURE
Bilirubin Urine: NEGATIVE
Glucose, Ur: NEGATIVE
Ketones, Urine: NEGATIVE
Nitrite, Urine: NEGATIVE
Protein, UA: NEGATIVE
Specific Gravity, UA: 1.006 (ref 1.005–1.030)
Urine Hgb: NEGATIVE
Urobilinogen, Urine: NORMAL
pH, UA: 6.5 (ref 5.0–8.0)

## 2016-09-22 LAB — CULTURE, URINE: Culture: NO GROWTH

## 2016-09-28 ENCOUNTER — Ambulatory Visit: Admit: 2016-09-28 | Discharge: 2016-09-28 | Payer: MEDICARE | Attending: Adult Health | Primary: Nurse Practitioner

## 2016-09-28 DIAGNOSIS — G20 Parkinson's disease: Secondary | ICD-10-CM

## 2016-09-28 MED ORDER — ROPINIROLE HCL 1 MG PO TABS
1 | ORAL_TABLET | Freq: Three times a day (TID) | ORAL | 5 refills | Status: DC
Start: 2016-09-28 — End: 2016-12-31

## 2016-09-28 NOTE — Progress Notes (Signed)
[facility]            [address]          [place], [place] [address]          Dept: [phone]          Dept Fax: [phone]        E. Roanna Epley, MD           Jacqlyn Krauss, MD         Ahmed B. Georgetta Haber, MD                   Magda Bernheim, MD              Magda Paganini, MD                Darcella Cheshire, CNP           09/28/2016    HPI:      Your patient, Shannon Allison returns for continuing neurologic care.  Patient is a 56 year old woman who was seen last on June 14, 2016 for treatment of Parkinson's disease.  Patient is a left-handed woman with past medical history significant for diabetes mellitus type 2, dyslipidemia, hypertension, bipolar disorder, and schizophrenia.  She currently lives in a group home because of her schizophrenia and is accompanied by a case Production designer, theatre/television/film.  Patient has tremor mainly in her left hand more than her right which is at rest and during ambulation.  It significantly had been affecting her daily living.  She also has a shuffling gait during ambulation, cogwheel rigidity, a masklike face, and bradykinesia.  There is no postural instability.  Patient had a DAT scan which showed decreased uptake in her left putamen compared to the right.  At her last visit, her Requip was titrated very slowly to 2 mg 3 times daily.  She is here today for follow-up and has noticed a significant improvement in her tremor.  She has more energy and her walking is improved.  She has no side effects to the medications and is quite happily with her response.  He denies any other significant problems such as headaches, blurred or double vision, weakness numbness or tingling in her extremities.                  Past Medical History:   Diagnosis Date   . Asthma    . Diabetes mellitus (HCC)          Past Surgical History:   Procedure Laterality Date   . NASAL SEPTUM SURGERY Bilateral    . PR COLON CA SCRN NOT HI RSK IND  01/04/2016    COLONOSCOPY performed  by Link Snuffer, MD at [facility]   . PR COLON CA SCRN NOT HI RSK IND N/A 02/10/2016    COLONOSCOPY; incomplete; aborted due to poor prep performed by Link Snuffer, MD at [facility]   . PR ESOPHAGOGASTRODUODENOSCOPY TRANSORAL DIAGNOSTIC N/A 01/04/2016    EGD ESOPHAGOGASTRODUODENOSCOPY performed by Link Snuffer, MD at [facility]       Family History   Problem Relation Age of Onset   . High Blood Pressure Mother    . High Blood Pressure Father        Social History   Substance Use Topics   . Smoking status: Current Every Day Smoker     Packs/day: 0.50   . Smokeless tobacco: Never Used   . Alcohol  use No                               REVIEW OF SYSTEMS    CONSTITUTIONAL Weight: absent, Appetite: absent, Fatigue: absent      HEENT Ears: normal, Visual disturbance: absent   RESPIRATORY Shortness of breath: absent, Cough: absent   CARDIOVASCULAR Chest pain: absent, Leg swelling :absent      GI Constipation: absent, Diarrhea: absent, Swallowing change: absent      GU Urinary frequency: absent, Urinary urgency: absent, Urinary incontinence: absent   MUSCULOSKELETAL Neck pain: absent, Back pain: absent, Stiffness: absent, Muscle pain: absent, Joint pain: absent Restless legs: absent   DERMATOLOGIC Hair loss: absent, Skin changes: absent   NEUROLOGIC Memory loss: absent, Confusion: absent, Seizures: absent Trouble walking or imbalance: absent, Dizziness: absent, Weakness: absent, Numbness: absent Tremor: present, Spasm: absent, Speech difficulty: absent, Headache: absent, Light sensitivity: absent   PSYCHIATRIC Anxiety: present, Hallucination: present, Mood disorder: present   HEMATOLOGIC Abnormal bleeding: absent, Anemia: absent, Clotting disorder: absent, Lymph gland changes: absent           No Known Allergies        Current Outpatient Prescriptions   Medication Sig Dispense Refill   . desvenlafaxine succinate (PRISTIQ) 100 MG TB24 extended release tablet Take by mouth daily     .  rOPINIRole (REQUIP) 0.25 MG tablet 3 tablets by mouth TID for one week 63 tablet 0   . rOPINIRole (REQUIP) 1 MG tablet Take 1 tablet by mouth 3 times daily For one week, then 1 and 1/2 tablets TID for one week 32 tablet 0   . rOPINIRole (REQUIP) 2 MG tablet Take 1 tablet by mouth 3 times daily 90 tablet 3   . folic acid (FOLVITE) 1 MG tablet Take 1 mg by mouth daily     . famotidine (PEPCID) 20 MG tablet Take 20 mg by mouth 2 times daily as needed     . polyethylene glycol (GLYCOLAX) packet Take 17 g by mouth daily as needed for Constipation     . paliperidone palmitate (INVEGA SUSTENNA) 234 MG/1.5ML SUSP IM injection Inject 234 mg into the muscle every 30 days     . fluticasone (FLONASE) 50 MCG/ACT nasal spray 1 spray by Nasal route daily     . montelukast (SINGULAIR) 10 MG tablet Take 10 mg by mouth nightly     . esomeprazole Magnesium (NEXIUM) 40 MG PACK Take 40 mg by mouth daily     . senna-docusate (PERICOLACE) 8.6-50 MG per tablet Take 1 tablet by mouth daily     . atorvastatin (LIPITOR) 20 MG tablet Take 20 mg by mouth daily     . LORazepam (ATIVAN) 1 MG tablet Take 1 mg by mouth every 8 hours as needed for Anxiety     . metFORMIN (GLUCOPHAGE) 500 MG tablet Take 500 mg by mouth 2 times daily (with meals)     . hydrOXYzine (VISTARIL) 25 MG capsule Take 25 mg by mouth 3 times daily as needed for Itching     . lisinopril (PRINIVIL;ZESTRIL) 5 MG tablet Take 5 mg by mouth daily       No current facility-administered medications for this visit.                                         PHYSICAL  EXAMINATION       BP 110/74 (Site: Left Arm, Position: Sitting)   Pulse 80   Ht 5\' 9"  (1.753 m)   Wt 219 lb 12.8 oz (99.7 kg)   BMI 32.46 kg/m                                             .                                                                                                      General Appearance:  Alert, cooperative, no signs of distress, appears stated age   Head:  Normocephalic, no signs of trauma   Eyes:   Conjunctiva/corneas clear;  eyelids intact   Ears:  Normal external ear and canals   Nose: Nares normal, mucosa normal, no drainage    Throat: Lips and tongue normal; teeth normal;  gums normal   Neck: Supple, intact flexion, extension and rotation;   trachea midline;  no adenopathy;   thyroid: not enlarged;   no carotid pulse abnormality   Back:   Symmetric, no curvature, ROM adequate   Lungs:   Respirations unlabored   Heart:  Regular rate and rhythm           Extremities: Extremities normal, no cyanosis, no edema   Pulses: Symmetric over head and neck   Skin: Skin color, texture normal, no rashes, no lesions                                             NEUROLOGIC EXAMINATION    Neurologic Exam     Mental Status   Oriented to person, place, and time.   Attention: normal.   Speech: speech is normal   Level of consciousness: alert  Normal comprehension.     Cranial Nerves     CN II   Visual fields full to confrontation.     CN III, IV, VI   Pupils are equal, round, and reactive to light.  Extraocular motions are normal.     CN V   Facial sensation intact.     CN VII   Facial expression full, symmetric.     CN VIII   CN VIII normal.     CN IX, X   CN IX normal.     CN XI   CN XI normal.     CN XII   CN XII normal.     Motor Exam   Muscle bulk: normal  Overall muscle tone: normal  Right arm tone: normal  Left arm tone: normal and cogwheel rigidity  Right arm pronator drift: absent  Left arm pronator drift: absent  Right leg tone: normal  Left leg tone: normal    Strength   Strength 5/5 throughout.     Sensory Exam   Light touch normal.  Vibration normal.   Pinprick normal.     Gait, Coordination, and Reflexes     Gait  Gait: shuffling    Coordination   Finger to nose coordination: normal    Tremor   Resting tremor: present (Present only in the left hand)  Intention tremor: absent    Reflexes   Right brachioradialis: 2+  Left brachioradialis: 2+  Right biceps: 2+  Left biceps: 2+  Right triceps: 2+  Left triceps:  2+  Right patellar: 2+  Left patellar: 2+  Right achilles: 2+  Left achilles: 2+            ASSESSMENT/PLAN:       In summary, your patient, Shannon Allison exhibits the following, with associated plan:    1. Parkinson's disease with positive DAT scan.  Symptoms improving with current medication regimen  1. Increase Requip by 1 mg daily each week for the next 3 weeks to then start 3 mg 3 times daily.  2. Patient was advised of several options for treatment of Parkinson's disease, but is happy with her current treatment.  3. Patient will be seen back for reevaluation in 3 months            Signed: Darcella Cheshire, CNP      *Please note that portions of this note were completed with a voice recognition program.  Although every effort was made to insure the accuracy of this automated transcription, some errors in transcription may have occurred, occasionally words and are mis-transcribed

## 2016-10-09 NOTE — Telephone Encounter (Signed)
I called pt, LM asking what dose she is taking of the Requip. I asked that she call me back.

## 2016-10-09 NOTE — Telephone Encounter (Signed)
Pt called in stating she has started the increase of the Requip as instructed and her tremors are not getting any better. She is wondering if there is any thing else that can be done. Please advise, thanks.

## 2016-10-09 NOTE — Telephone Encounter (Signed)
Pt called back in. She said she just increased to the 2 mg TID yesterday. She should start the 3 mg TID next week. I told her that if she doesn't notice any relief in the tremors by the end of next week, after increasing the dose to the 3 mg TID, then give us a call back and we can discuss with you about either further increasing the dose or possibly adding something else. She stated her understanding.

## 2016-10-09 NOTE — Telephone Encounter (Signed)
I don't think she is up to 3 mg 3 times daily yet.  Ask her to be patient until she gets up to the higher dosage.  If she is already taking 3 mg 3 times daily, we can further increase it, but we have to do so by titrating the medication.

## 2016-10-19 MED ORDER — ROPINIROLE HCL 2 MG PO TABS
2 MG | ORAL_TABLET | Freq: Three times a day (TID) | ORAL | 2 refills | Status: DC
Start: 2016-10-19 — End: 2016-12-31

## 2016-10-19 NOTE — Telephone Encounter (Signed)
Shannon Allison next appointment is 01/03/2017.

## 2016-12-26 ENCOUNTER — Emergency Department: Admit: 2016-12-26 | Payer: MEDICAID | Primary: Nurse Practitioner

## 2016-12-26 ENCOUNTER — Inpatient Hospital Stay
Admission: EM | Admit: 2016-12-26 | Discharge: 2016-12-31 | Disposition: A | Discharge status: Home Health | Payer: MEDICAID | Source: Other Acute Inpatient Hospital | Admitting: Family Medicine

## 2016-12-26 DIAGNOSIS — J441 Chronic obstructive pulmonary disease with (acute) exacerbation: Secondary | ICD-10-CM

## 2016-12-26 LAB — CBC
Hematocrit: 40.9 % (ref 36.3–47.1)
Hemoglobin: 13.1 g/dL (ref 11.9–15.1)
MCH: 28.9 pg (ref 25.2–33.5)
MCHC: 32 g/dL (ref 28.4–34.8)
MCV: 90.1 fL (ref 82.6–102.9)
MPV: 11 fL (ref 8.1–13.5)
NRBC Automated: 0 per 100 WBC
Platelets: 245 10*3/uL (ref 138–453)
RBC: 4.54 m/uL (ref 3.95–5.11)
RDW: 12.9 % (ref 11.8–14.4)
WBC: 7.7 10*3/uL (ref 3.5–11.3)

## 2016-12-26 LAB — BASIC METABOLIC PANEL
Anion Gap: 12 mmol/L (ref 9–17)
BUN: 12 mg/dL (ref 6–20)
CO2: 28 mmol/L (ref 20–31)
Calcium: 9.3 mg/dL (ref 8.6–10.4)
Chloride: 100 mmol/L (ref 98–107)
Creatinine: 0.48 mg/dL — ABNORMAL LOW (ref 0.50–0.90)
GFR African American: >60 mL/min (ref >60)
GFR Non-African American: >60 mL/min (ref >60)
Glucose: 114 mg/dL — ABNORMAL HIGH (ref 70–99)
Potassium: 4.2 mmol/L (ref 3.7–5.3)
Sodium: 140 mmol/L (ref 135–144)

## 2016-12-26 LAB — EKG 12-LEAD
Atrial Rate: 95 {beats}/min
P Axis: 81 degrees
P-R Interval: 142 ms
Q-T Interval: 372 ms
QRS Duration: 92 ms
QTc Calculation (Bazett): 467 ms
R Axis: -42 degrees
T Axis: 53 degrees
Ventricular Rate: 95 {beats}/min

## 2016-12-26 LAB — VENOUS BLOOD GAS, POINT OF CARE
HCO3, Venous: 30.4 mmol/L — ABNORMAL HIGH (ref 22.0–29.0)
O2 Sat, Ven: 57 % — ABNORMAL LOW (ref 60.0–85.0)
Positive Base Excess, Ven: 4 — ABNORMAL HIGH (ref 0.0–3.0)
Total CO2, Venous: 32 mmol/L — ABNORMAL HIGH (ref 23.0–30.0)
pCO2, Ven: 53.3 mm Hg — ABNORMAL HIGH (ref 41.0–51.0)
pH, Ven: 7.365 (ref 7.320–7.430)
pO2, Ven: 31.6 mm Hg (ref 30.0–50.0)

## 2016-12-26 LAB — CREATININE W/GFR POINT OF CARE
GFR Comment: >60 mL/min (ref >60)
GFR Non-African American: >60 mL/min (ref >60)
POC Creatinine: 0.55 mg/dL (ref 0.51–1.19)

## 2016-12-26 LAB — TROPONIN: Troponin T: <0.03 ng/mL (ref <0.03)

## 2016-12-26 LAB — CALCIUM, IONIC (POC): POC Ionized Calcium: 1.14 mmol/L — ABNORMAL LOW (ref 1.15–1.33)

## 2016-12-26 LAB — POCT GLUCOSE: POC Glucose: 128 mg/dL — ABNORMAL HIGH (ref 74–100)

## 2016-12-26 LAB — CHLORIDE (POC): POC Chloride: 103 mmol/L (ref 98–107)

## 2016-12-26 LAB — HGB/HCT
POC Hematocrit: 43 % (ref 36–46)
POC Hemoglobin: 14.6 g/dL (ref 12.0–16.0)

## 2016-12-26 LAB — ANION GAP (CALC) POC: Anion Gap: 8 mmol/L (ref 7–16)

## 2016-12-26 LAB — POTASSIUM (POC): POC Potassium: 3.9 mmol/L (ref 3.5–4.5)

## 2016-12-26 LAB — SODIUM (POC): POC Sodium: 141 mmol/L (ref 138–146)

## 2016-12-26 LAB — LACTIC ACID,POINT OF CARE: POC Lactic Acid: 2.08 mmol/L — ABNORMAL HIGH (ref 0.56–1.39)

## 2016-12-26 MED ORDER — IPRATROPIUM BROMIDE 0.02 % IN SOLN
0.02 | RESPIRATORY_TRACT | Status: DC | PRN
Start: 2016-12-26 — End: 2016-12-26
  Administered 2016-12-26: 17:00:00 0.5 mg via RESPIRATORY_TRACT

## 2016-12-26 MED ORDER — METHYLPREDNISOLONE SODIUM SUCC 125 MG IJ SOLR
125 MG | Freq: Every day | INTRAMUSCULAR | Status: DC
Start: 2016-12-26 — End: 2016-12-26
  Administered 2016-12-26: 18:00:00 125 mg via INTRAVENOUS

## 2016-12-26 MED ORDER — ALBUTEROL SULFATE HFA 108 (90 BASE) MCG/ACT IN AERS
108 | RESPIRATORY_TRACT | Status: DC | PRN
Start: 2016-12-26 — End: 2016-12-26

## 2016-12-26 MED ORDER — ALBUTEROL SULFATE (5 MG/ML) 0.5% IN NEBU
RESPIRATORY_TRACT | Status: DC | PRN
Start: 2016-12-26 — End: 2016-12-26

## 2016-12-26 MED ORDER — ALBUTEROL SULFATE (2.5 MG/3ML) 0.083% IN NEBU
RESPIRATORY_TRACT | Status: DC | PRN
Start: 2016-12-26 — End: 2016-12-26
  Administered 2016-12-26: 19:00:00 5 mg via RESPIRATORY_TRACT

## 2016-12-26 MED ORDER — ALBUTEROL SULFATE HFA 108 (90 BASE) MCG/ACT IN AERS
108 (90 Base) MCG/ACT | RESPIRATORY_TRACT | Status: DC | PRN
Start: 2016-12-26 — End: 2016-12-26

## 2016-12-26 MED ORDER — ALBUTEROL SULFATE (2.5 MG/3ML) 0.083% IN NEBU
Freq: Four times a day (QID) | RESPIRATORY_TRACT | Status: DC | PRN
Start: 2016-12-26 — End: 2016-12-26
  Administered 2016-12-26: 17:00:00 10 mg/h via RESPIRATORY_TRACT

## 2016-12-26 MED ORDER — ALBUTEROL SULFATE (5 MG/ML) 0.5% IN NEBU
RESPIRATORY_TRACT | Status: DC
Start: 2016-12-26 — End: 2016-12-26
  Administered 2016-12-26: 19:00:00 15 mg/h via RESPIRATORY_TRACT

## 2016-12-26 MED ORDER — ALBUTEROL SULFATE (2.5 MG/3ML) 0.083% IN NEBU
Freq: Four times a day (QID) | RESPIRATORY_TRACT | Status: DC | PRN
Start: 2016-12-26 — End: 2016-12-26

## 2016-12-26 MED ORDER — IPRATROPIUM BROMIDE HFA 17 MCG/ACT IN AERS
17 | RESPIRATORY_TRACT | Status: DC | PRN
Start: 2016-12-26 — End: 2016-12-26

## 2016-12-26 MED ORDER — IPRATROPIUM-ALBUTEROL 0.5-2.5 (3) MG/3ML IN SOLN
RESPIRATORY_TRACT | Status: DC | PRN
Start: 2016-12-26 — End: 2016-12-26

## 2016-12-26 MED ORDER — MAGNESIUM SULFATE IN D5W 1-5 GM/100ML-% IV SOLN
1-5 GM/100ML-% | INTRAVENOUS | Status: AC
Start: 2016-12-26 — End: 2016-12-26
  Administered 2016-12-26: 18:00:00 1 g via INTRAVENOUS

## 2016-12-26 MED FILL — ALBUTEROL SULFATE (2.5 MG/3ML) 0.083% IN NEBU: RESPIRATORY_TRACT | Qty: 3

## 2016-12-26 MED FILL — SOLU-MEDROL 125 MG IJ SOLR: 125 MG | INTRAMUSCULAR | Qty: 125

## 2016-12-26 MED FILL — MAGNESIUM SULFATE IN D5W 1-5 GM/100ML-% IV SOLN: 1-5 GM/100ML-% | INTRAVENOUS | Qty: 100

## 2016-12-26 MED FILL — ALBUTEROL SULFATE (5 MG/ML) 0.5% IN NEBU: RESPIRATORY_TRACT | Qty: 0.5

## 2016-12-26 NOTE — ED Notes (Signed)
Internal Medicine at bedside.     Arlana Lindau, [place]  12/26/16 1524

## 2016-12-26 NOTE — ED Notes (Signed)
Dr. Sherrill Raringuss at bedside.     Rollen SoxAudrey L Jordi Kamm, RN  12/26/16 1331

## 2016-12-26 NOTE — ED Notes (Signed)
Awaiting bed placement     [facility] Sz[facility], RN  12/26/16 1605

## 2016-12-26 NOTE — ED Provider Notes (Signed)
[facility] ED  Emergency Department Encounter  Emergency Medicine Resident     Pt Name: Shannon Allison  MRN: [id number]  Birthdate 06-Jan-1961  Date of evaluation: 12/26/16  PCP:  Anise Salvo, APRN - CNP    CHIEF COMPLAINT       Chief Complaint   Patient presents with   . Shortness of Breath     2 days ongoing. Hx of Asthma no relief from inhaler.   . Back Pain       HISTORY OF PRESENT ILLNESS  (Location/Symptom, Timing/Onset, Context/Setting, Quality, Duration, Modifying Factors, Severity.)      Shannon Allison is a 56 y.o. female who presents with Breath and cough for the past 2 days.  Patient is a 56 year old female with past medical history significant for asthma, parkinson disease, diabetes who presents with worsening shortness of breath for the past 2 days.  Patient mentioned that she's been having shortness of breath, cough no sputum production for the past 2-3 days.  Her shortness of breath progressively got worst.  She mentioned that she's had some midsternal chest pain as well this morning. Patient tried breathing treatments at home but did not get any relief and came to the ED.  Patient was hypoixc with Saturation in high 80's , tachycardic and wheezing.     PAST MEDICAL / SURGICAL / SOCIAL / FAMILY HISTORY      has a past medical history of Asthma and Diabetes mellitus (HCC).     has a past surgical history that includes Nasal septum surgery (Bilateral); pr esophagogastroduodenoscopy transoral diagnostic (N/A, 01/04/2016); pr colon ca scrn not hi rsk ind (01/04/2016); and pr colon ca scrn not hi rsk ind (N/A, 02/10/2016).    Social History     Social History   . Marital status: Single     Spouse name: N/A   . Number of children: N/A   . Years of education: N/A     Occupational History   . Not on file.     Social History Main Topics   . Smoking status: Current Every Day Smoker     Packs/day: 0.50   . Smokeless tobacco: Never Used   . Alcohol use No   . Drug use: No   . Sexual activity:  Not on file     Other Topics Concern   . Not on file     Social History Narrative   . No narrative on file       Family History   Problem Relation Age of Onset   . High Blood Pressure Mother    . High Blood Pressure Father        Allergies:  Patient has no known allergies.    Home Medications:  Prior to Admission medications    Medication Sig Start Date End Date Taking? Authorizing Provider   rOPINIRole (REQUIP) 2 MG tablet TAKE 1 TABLET BY MOUTH 3 TIMES DAILY 10/19/16   Foy Guadalajara, APRN - CNP   rOPINIRole (REQUIP) 1 MG tablet Take 1 tablet by mouth 3 times daily In addition to a 2 mg tablet for a total of 3 mg TID 09/28/16   Foy Gu[place]ara, APRN - CNP   desvenlafaxine succinate (PRISTIQ) 100 MG TB24 extended release tablet Take by mouth daily    Historical Provider, MD   folic acid (FOLVITE) 1 MG tablet Take 1 mg by mouth daily    Historical Provider, MD   famotidine (PEPCID) 20 MG  tablet Take 20 mg by mouth 2 times daily as needed    Historical Provider, MD   polyethylene glycol (GLYCOLAX) packet Take 17 g by mouth daily as needed for Constipation    Historical Provider, MD   paliperidone palmitate (INVEGA SUSTENNA) 234 MG/1.5ML SUSP IM injection Inject 234 mg into the muscle every 30 days    Historical Provider, MD   fluticasone (FLONASE) 50 MCG/ACT nasal spray 1 spray by Nasal route daily    Historical Provider, MD   montelukast (SINGULAIR) 10 MG tablet Take 10 mg by mouth nightly    Historical Provider, MD   esomeprazole Magnesium (NEXIUM) 40 MG PACK Take 40 mg by mouth daily    Historical Provider, MD   senna-docusate (PERICOLACE) 8.6-50 MG per tablet Take 1 tablet by mouth daily    Historical Provider, MD   atorvastatin (LIPITOR) 20 MG tablet Take 20 mg by mouth daily    Historical Provider, MD   LORazepam (ATIVAN) 1 MG tablet Take 1 mg by mouth every 8 hours as needed for Anxiety    Historical Provider, MD   metFORMIN (GLUCOPHAGE) 500 MG tablet Take 500 mg by mouth 2 times daily (with meals)     Historical Provider, MD   hydrOXYzine (VISTARIL) 25 MG capsule Take 25 mg by mouth 3 times daily as needed for Itching    Historical Provider, MD   lisinopril (PRINIVIL;ZESTRIL) 5 MG tablet Take 5 mg by mouth daily    Historical Provider, MD       REVIEW OF SYSTEMS    (2-9 systems for level 4, 10 or more for level 5)      Review of Systems   Constitutional: Negative for activity change and appetite change.   HENT: Negative for congestion and dental problem.    Respiratory: Positive for cough, chest tightness, shortness of breath and wheezing.    Cardiovascular: Positive for chest pain.   Gastrointestinal: Negative for abdominal distention, abdominal pain and anal bleeding.   Musculoskeletal: Negative for arthralgias and back pain.       PHYSICAL EXAM   (up to 7 for level 4, 8 or more for level 5)      INITIAL VITALS:   BP 123/74   Pulse 102   Temp 98.2 F (36.8 C) (Oral)   Resp 20   Ht  (1.753 m)   Wt 205 lb (93 kg)   SpO2 (!) 87%   BMI 30.27 kg/m     Physical Exam   Constitutional: She is oriented to person, place, and time. She appears well-developed. She appears distressed.   HENT:   Head: Normocephalic.   Eyes: Pupils are equal, round, and reactive to light.   Neck: Normal range of motion.   Cardiovascular: Normal rate, regular rhythm and normal heart sounds.    Pulmonary/Chest: Effort normal. She has wheezes. She has rales.   Abdominal: Soft.   Musculoskeletal: Normal range of motion.   Neurological: She is alert and oriented to person, place, and time.   Patient has resting tremors        DIFFERENTIAL  DIAGNOSIS     PLAN (LABS / IMAGING / EKG):  Orders Placed This Encounter   Procedures   . URINE CULTURE CLEAN CATCH   . XR CHEST PORTABLE   . Troponin   . CBC   . Urinalysis with microscopic   . Hemoglobin and hematocrit, blood   . SODIUM (POC)   . POTASSIUM (POC)   . CHLORIDE (POC)   .  CALCIUM, IONIC (POC)   . Basic Metabolic Panel   . Telemetry monitoring   . Inpatient consult to Internal  Medicine   . Initiate ED Aerosol Protocol   . MDI Treatment   . HHN Treatment   . HHN Treatment   . POC Blood Gas   . Venous Blood Gas, POC   . Creatinine W/GFR Point of Care   . Lactic Acid, POC   . POCT Glucose   . Anion Gap (Calc) POC   . EKG 12 Lead       MEDICATIONS ORDERED:  Orders Placed This Encounter   Medications   . methylPREDNISolone sodium (SOLU-MEDROL) injection 125 mg   . magnesium sulfate 1 g in dextrose 5% 100 mL IVPB   . DISCONTD: albuterol (PROVENTIL) nebulizer solution   . DISCONTD: albuterol (PROVENTIL) nebulizer solution 5 mg   . ipratropium-albuterol (DUONEB) nebulizer solution 1 ampule   . albuterol (PROVENTIL) nebulizer solution 5 mg   . albuterol sulfate HFA 108 (90 Base) MCG/ACT inhaler 2 puff   . DISCONTD: albuterol sulfate HFA 108 (90 Base) MCG/ACT inhaler 2 puff   . DISCONTD: ipratropium (ATROVENT HFA) 17 MCG/ACT inhaler 2 puff   . ipratropium (ATROVENT) 0.02 % nebulizer solution 0.5 mg   . DISCONTD: albuterol (PROVENTIL) nebulizer solution   . albuterol (PROVENTIL) nebulizer solution       DDX: Asthma exacerbation, COPD ,     DIAGNOSTIC RESULTS / EMERGENCY DEPARTMENT COURSE / MDM     LABS:  Results for orders placed or performed during the hospital encounter of 12/26/16   Troponin   Result Value Ref Range    Troponin T <0.03 <0.03 ng/mL    Troponin Interp         CBC   Result Value Ref Range    WBC 7.7 3.5 - 11.3 k/uL    RBC 4.54 3.95 - 5.11 m/uL    Hemoglobin 13.1 11.9 - 15.1 g/dL    Hematocrit 11.940.9 14.736.3 - 47.1 %    MCV 90.1 82.6 - 102.9 fL    MCH 28.9 25.2 - 33.5 pg    MCHC 32.0 28.4 - 34.8 g/dL    RDW 82.912.9 56.211.8 - 13.014.4 %    Platelets 245 138 - 453 k/uL    MPV 11.0 8.1 - 13.5 fL    NRBC Automated 0.0 0.0 per 100 WBC   Hemoglobin and hematocrit, blood   Result Value Ref Range    POC Hemoglobin 14.6 12.0 - 16.0 g/dL    POC Hematocrit 43 36 - 46 %   SODIUM (POC)   Result Value Ref Range    POC Sodium 141 138 - 146 mmol/L   POTASSIUM (POC)   Result Value Ref Range    POC Potassium 3.9 3.5  - 4.5 mmol/L   CHLORIDE (POC)   Result Value Ref Range    POC Chloride 103 98 - 107 mmol/L   CALCIUM, IONIC (POC)   Result Value Ref Range    POC Ionized Calcium 1.14 (L) 1.15 - 1.33 mmol/L   Basic Metabolic Panel   Result Value Ref Range    Glucose 114 (H) 70 - 99 mg/dL    BUN 12 6 - 20 mg/dL    CREATININE 8.650.48 (L) 0.50 - 0.90 mg/dL    Bun/Cre Ratio NOT REPORTED 9 - 20    Calcium 9.3 8.6 - 10.4 mg/dL    Sodium 784140 696135 - 295144 mmol/L    Potassium 4.2 3.7 - 5.3 mmol/L  Chloride 100 98 - 107 mmol/L    CO2 28 20 - 31 mmol/L    Anion Gap 12 9 - 17 mmol/L    GFR Non-African American >60 >60 mL/min    GFR African American >60 >60 mL/min    GFR Comment          GFR Staging NOT REPORTED    Venous Blood Gas, POC   Result Value Ref Range    pH, Ven 7.365 7.320 - 7.430    pCO2, Ven 53.3 (H) 41.0 - 51.0 mm Hg    pO2, Ven 31.6 30.0 - 50.0 mm Hg    HCO3, Venous 30.4 (H) 22.0 - 29.0 mmol/L    Total CO2, Venous 32 (H) 23.0 - 30.0 mmol/L    Negative Base Excess, Ven NOT REPORTED 0.0 - 2.0    Positive Base Excess, Ven 4 (H) 0.0 - 3.0    O2 Sat, Ven 57 (L) 60.0 - 85.0 %    O2 Device/Flow/% NOT REPORTED     Allen Test NOT REPORTED     Sample Site NOT REPORTED     Mode NOT REPORTED     FIO2 NOT REPORTED     Pt Temp NOT REPORTED     POC pH Temp NOT REPORTED     POC pCO2 Temp NOT REPORTED mm Hg    POC pO2 Temp NOT REPORTED mm Hg   Creatinine W/GFR Point of Care   Result Value Ref Range    POC Creatinine 0.55 0.51 - 1.19 mg/dL    GFR Comment >91 >47 mL/min    GFR Non-African American >60 >60 mL/min    GFR Comment         Lactic Acid, POC   Result Value Ref Range    POC Lactic Acid 2.08 (H) 0.56 - 1.39 mmol/L   POCT Glucose   Result Value Ref Range    POC Glucose 128 (H) 74 - 100 mg/dL   Anion Gap (Calc) POC   Result Value Ref Range    Anion Gap 8 7 - 16 mmol/L       IMPRESSION: 31 Y  OF with PMH of Asthma comes in with progressive worsening SOB, not releved by inhalers, Comes to the ED hypoxic. Asthma exacerbation working diagnosis      RADIOLOGY:  No results found.    EKG  None    All EKG's are interpreted by the Emergency Department Physician who either signs or Co-signs this chart in the absence of a cardiologist.    EMERGENCY DEPARTMENT COURSE:  - Patient given put on Nasal cannual , given breathing treatment   - CXR , basic lab work and VBG ordered and reviewed   - IV solumedrol 125 mg given once   - Patient given 2 mg magnesium   - CXR reviewed showed pulm vasc congestion   - Internal Medicine consulted and they accepted the patient          PROCEDURES:  Procedures    CONSULTS:  IP CONSULT TO INTERNAL MEDICINE    CRITICAL CARE:  None    FINAL IMPRESSION      1. Intermittent asthma with acute exacerbation, unspecified asthma severity          DISPOSITION / PLAN     DISPOSITION Decision To Admit 12/26/2016 01:38:34 PM      PATIENT REFERRED TO:  No follow-up provider specified.    DISCHARGE MEDICATIONS:  New Prescriptions    No medications on file  SangramValla LeaverY-3, Internal Medicine Residency Resident.  [facility], [place], [place]      (Please note that portions of this note were completed with a voice recognition program.  Efforts were made to edit the dictations but occasionally words are mis-transcribed.)       Aline August, MD  Resident  12/26/16 [phone]

## 2016-12-26 NOTE — ED Notes (Signed)
Dr.Russ at bedside.     Arlana LindauShelby Szych, RN  12/26/16 1330

## 2016-12-26 NOTE — ED Notes (Signed)
Patient resting NAD. Denies any needs. Awaiting bed placement.     Arlana LindauShelby Szych, [place]  12/26/16 [phone]

## 2016-12-26 NOTE — ED Notes (Signed)
Marcia RT at bedside.     Arlana LindauShelby Szych, [place]  12/26/16 [phone]

## 2016-12-26 NOTE — ED Provider Notes (Signed)
[place] [facility] ED  Emergency Department  Emergency Medicine Resident Sign-out     Care of Shannon Allison was assumed from Dr. Laurette Schimke and is being seen for Shortness of Breath (2 days ongoing. Hx of Asthma no relief from inhaler.) and Back Pain  .  The patient's initial evaluation and plan have been discussed with the prior provider who initially evaluated the patient.     EMERGENCY DEPARTMENT COURSE / MEDICAL DECISION MAKING:       MEDICATIONS GIVEN:  Orders Placed This Encounter   Medications   . methylPREDNISolone sodium (SOLU-MEDROL) injection 125 mg   . magnesium sulfate 1 g in dextrose 5% 100 mL IVPB   . DISCONTD: albuterol (PROVENTIL) nebulizer solution   . DISCONTD: albuterol (PROVENTIL) nebulizer solution 5 mg   . ipratropium-albuterol (DUONEB) nebulizer solution 1 ampule   . albuterol (PROVENTIL) nebulizer solution 5 mg   . albuterol sulfate HFA 108 (90 Base) MCG/ACT inhaler 2 puff   . DISCONTD: albuterol sulfate HFA 108 (90 Base) MCG/ACT inhaler 2 puff   . DISCONTD: ipratropium (ATROVENT HFA) 17 MCG/ACT inhaler 2 puff   . ipratropium (ATROVENT) 0.02 % nebulizer solution 0.5 mg   . DISCONTD: albuterol (PROVENTIL) nebulizer solution   . albuterol (PROVENTIL) nebulizer solution       LABS / RADIOLOGY:     Labs Reviewed   CALCIUM, IONIC (POC) - Abnormal; Notable for the following:        Result Value    POC Ionized Calcium 1.14 (*)     All other components within normal limits   BASIC METABOLIC PANEL - Abnormal; Notable for the following:     Glucose 114 (*)     CREATININE 0.48 (*)     All other components within normal limits   VENOUS BLOOD GAS, POINT OF CARE - Abnormal; Notable for the following:     pCO2, Ven 53.3 (*)     HCO3, Venous 30.4 (*)     Total CO2, Venous 32 (*)     Positive Base Excess, Ven 4 (*)     O2 Sat, Ven 57 (*)     All other components within normal limits   LACTIC ACID,POINT OF CARE - Abnormal; Notable for the following:     POC Lactic Acid 2.08 (*)     All other  components within normal limits   POCT GLUCOSE - Abnormal; Notable for the following:     POC Glucose 128 (*)     All other components within normal limits   URINE CULTURE CLEAN CATCH   TROPONIN   CBC   HGB/HCT   SODIUM (POC)   POTASSIUM (POC)   CHLORIDE (POC)   URINALYSIS WITH MICROSCOPIC   POC BLOOD GAS   CREATININE W/GFR POINT OF CARE   ANION GAP (CALC) POC       Xr Chest Portable    Result Date: 12/26/2016  EXAMINATION: SINGLE XRAY VIEW OF THE CHEST 12/26/2016 2:10 pm COMPARISON: July 20, 2016 HISTORY: ORDERING SYSTEM PROVIDED HISTORY: SOB TECHNOLOGIST PROVIDED HISTORY: SOB FINDINGS: Enlarged heart with pulmonary venous congestion.  No pleural effusion.  No pneumothorax.  Mild interstitial fullness.  No focal consolidation. Degenerative changes of AC joints.     Cardiomegaly and mild vascular congestion.       RECENT VITALS:     Temp: 98.2 F (36.8 C),  Pulse: 85, Resp: 20, BP: 137/63, SpO2: 95 %    This patient is a 56 y.o. Female with Acute  asthma exacerbation.  Patient's symptoms improved with slight Medrol, magnesium, breathing treatments.  Patient admitted to internal medicine for continued treatment of her asthma exacerbation      OUTSTANDING TASKS / RECOMMENDATIONS:    1. Pending bed placement     FINAL IMPRESSION:     1. Intermittent asthma with acute exacerbation, unspecified asthma severity        DISPOSITION:         DISPOSITION:    Discharge     Transfer -      Admission -  IM     Against Medical Advice     Eloped   FOLLOW-UP: Anise Salvo, APRN - CNP  [address]  [place] [address]  [phone]           DISCHARGE MEDICATIONS: New Prescriptions    No medications on file           Dannette Barbara, DO  Emergency Medicine Resident  [facility]       Dannette Barbara, [place]  Resident  12/27/16 [phone]

## 2016-12-26 NOTE — H&P (Signed)
[place] [facility]     Department of Internal Medicine - Staff Internal Medicine Service          ADMISSION NOTE/HISTORY AND PHYSICAL EXAMINATION     CHIEF COMPLAINT:       SOB    HISTORY OBTAIN FROM:      Patient    HISTORY OF PRESENT ILLNESS:       The patient is a pleasant 56 y.o. female with significant past medical history of Asthma, Hypertension, HLD, parkinson's disease presented with SOB for last 2 days. She uses albuterol inhaler twice daily but recently since last few days has been using 3-4 times daily.  Denies any fever or chills  Denies any chest pain  No nausea or vomiting  Feeling thirsty    Her last exacerbation of asthma was 2 years ago    She is a smoker, smokes  cigs day, was smoking 1 pack/day      PAST MEDICAL HISTORY:           Diagnosis Date   . Asthma    . Diabetes mellitus (HCC)          PAST SURGICAL HISTORY:           Procedure Laterality Date   . NASAL SEPTUM SURGERY Bilateral    . PR COLON CA SCRN NOT HI RSK IND  01/04/2016    COLONOSCOPY performed by Link Snuffer, MD at [facility]   . PR COLON CA SCRN NOT HI RSK IND N/A 02/10/2016    COLONOSCOPY; incomplete; aborted due to poor prep performed by Link Snuffer, MD at [facility]   . PR ESOPHAGOGASTRODUODENOSCOPY TRANSORAL DIAGNOSTIC N/A 01/04/2016    EGD ESOPHAGOGASTRODUODENOSCOPY performed by Link Snuffer, MD at [facility]         MEDICATION PRIOR TO ADMISSION:     Not in a hospital admission.    Allergies:     Patient has no known allergies.      SOCIAL HISTORY:      TOBACCO:  10 cig/day  ETOH: none  DRUGS: none  Lives in group home    FAMILY HISTORY:      Family History   Problem Relation Age of Onset   . High Blood Pressure Mother    . High Blood Pressure Father          REVIEW OF SYSTEMS:     Review of Systems   Constitutional: Negative for chills, diaphoresis and fever.   Eyes: Negative for blurred vision and double vision.   Respiratory: Positive for cough, shortness of breath and  wheezing. Negative for sputum production.    Cardiovascular: Negative for chest pain, palpitations and leg swelling.   Gastrointestinal: Positive for constipation. Negative for abdominal pain, nausea and vomiting.   Genitourinary: Negative.    Skin: Negative.    Neurological: Negative for dizziness and headaches.           PHYSICAL EXAM:   Vitals: BP 123/74   Pulse 102   Temp 98.2 F (36.8 C) (Oral)   Resp 20   Ht  (1.753 m)   Wt 205 lb (93 kg)   SpO2 (!) 87%   BMI 30.27 kg/m     Body weight:   Wt Readings from Last 3 Encounters:   12/26/16 205 lb (93 kg)   09/28/16 219 lb 12.8 oz (99.7 kg)   07/10/16 222 lb (100.7 kg)  Body Mass Index : Body mass index is 30.27 kg/m.        PHYSICAL EXAMINATION :    Physical Exam   Constitutional: She is oriented to person, place, and time. No distress.   tremors of bilateral Upper extremities   HENT:   Dry oral mucosa   Eyes: Conjunctivae are normal.   Neck: Neck supple. No JVD present.   Cardiovascular: Normal rate, regular rhythm and normal heart sounds.  Exam reveals no friction rub.    No murmur heard.  Pulmonary/Chest: Effort normal. No respiratory distress. She has wheezes (nilateral diffuse expirtory ). She has no rales. She exhibits no tenderness.   Abdominal: Soft. She exhibits no distension. There is no tenderness.   Musculoskeletal: She exhibits no edema.   Neurological: She is alert and oriented to person, place, and time.         Diagnostic Labs and Imaging    CBC:   Recent Labs      12/26/16   1409   WBC  7.7   RBC  4.54   HGB  13.1   HCT  40.9   MCV  90.1   RDW  12.9   PLT  245     BMP:   Recent Labs      12/26/16   1341  12/26/16   1409   NA   --   140   K   --   4.2   CL   --   100   CO2   --   28   BUN   --   12   CREATININE  0.55  0.48*     BNP: No results for input(s): BNP in the last 72 hours.  PT/INR: No results for input(s): PROTIME, INR in the last 72 hours.  APTT: No results for input(s): APTT in the last 72 hours.  CARDIAC ENZYMES: No  results for input(s): CKMB, CKMBINDEX, TROPONINI in the last 72 hours.    Invalid input(s): CKTOTAL;3  FASTING LIPID PANEL:  Lab Results   Component Value Date    CHOL 143 08/28/2016    HDL 51 08/28/2016    TRIG 098 08/28/2016     LIVER PROFILE: No results for input(s): AST, ALT, ALB, BILIDIR, BILITOT, ALKPHOS in the last 72 hours.     Xr Chest Portable    Result Date: 12/26/2016  EXAMINATION: SINGLE XRAY VIEW OF THE CHEST 12/26/2016 2:10 pm COMPARISON: July 20, 2016 HISTORY: ORDERING SYSTEM PROVIDED HISTORY: SOB TECHNOLOGIST PROVIDED HISTORY: SOB FINDINGS: Enlarged heart with pulmonary venous congestion.  No pleural effusion.  No pneumothorax.  Mild interstitial fullness.  No focal consolidation. Degenerative changes of AC joints.     Cardiomegaly and mild vascular congestion.         Assessment and Plan:     Principal Problem:    Asthma exacerbation, mild  Active Problems:    Essential hypertension    Dyslipidemia    Parkinson's disease (HCC)    Type 2 diabetes mellitus, without long-term current use of insulin (HCC)  Resolved Problems:    * No resolved hospital problems. *      This is a 56 y.o. female who presented with SOB and found to have acute exacerbation of Asthma.     1. Duoneb Q 4 hrs and Prn  2. Pt received 125 mg of methyl prednisone in Er, will give 40 mg of oral prednisone from tomorrow for 5 days  3. Bedside spirometry Q8 hrly  4. Continue Lisinopril for hypertension  5. Continue metformin BID  6. Continue montelukast  7. Continue Requip- home medication for parkinson's  8 Continue Pristiq and vistaril as prescribed by [facility] for depression  9. Ativan prn for tremors- home medication  10 Likely discharge tomorrow if breathing improves  11. Pt is on Nexium for GERD    DVT Prophylaxis: Lovenox  Discharge planning: OT/PT/Social service referral    Bayard Beaver, MD   PGY 3 Internal Medicine Resident   [facility]

## 2016-12-26 NOTE — ED Notes (Signed)
RT at bedside with treatment     Arlana LindauShelby Szych, RN  12/26/16 1327

## 2016-12-26 NOTE — ED Notes (Signed)
Patient arrives aox4 ambulatory to treatment room with c/o SOB x2 days. Oxygen placed on patient. Patient does not use oxygen at home. Patient reports cough and general malaise. Pt placed on monitor.      Arlana Lindau, RN  12/26/16 1329

## 2016-12-26 NOTE — Care Coordination-Inpatient (Signed)
Case Management Initial Discharge Plan  Shannon Allison,             Met with:patient to discuss discharge plans.   Information verified: address, contacts, phone number, DOB, insurance Yes  PCP: Ayesha Mohair, APRN - CNP  Date of last visit: she has an appointment scheduled 12-27-16    Insurance Provider: [facility] [employer]    Discharge Planning    Living Arrangements:      Support Systems:       Home has 2 stories  3 stairs to climb to get into front door,   Location of bedroom/bathroom in home 1st floor    Patient able to perform ADL's:Independent    Current Services (outpatient & in home) none  DME equipment: none  DME provider:     Pharmacy: [facility], medications are delivered to [facility] on [address] Medications:     Does patient want to participate in local refill/ meds to beds program?       Potential Assistance Needed:       Patient agreeable to home care: No  Freedom of choice provided:  n/a    Prior SNF/Rehab Placement and Facility:   Agreeable to SNF/Rehab: No  Freedom of choice provided: no  Social Services Evaluation: no    Expected Discharge date:     Patient expects to be discharged to:     Follow Up Appointment: Best Day/ Time:      Transportation provider: medical cab  Transportation arrangements needed for discharge: Yes, will need medical cab    Readmission Risk              Risk of Unplanned Readmission:        0               Does patient have a readmission risk score greater than 14?: readmission risk is not calculated at this time  If yes, follow-up appointment must be made within 7 days of discharge.     Discharge Plan: return to group home, transition needs to be determined.          Electronically signed by Florian Buff, RN on 12/26/16 at 6:51 PM

## 2016-12-26 NOTE — ED Notes (Signed)
Xray at bedside.     Arlana LindauShelby Szych, [place]  12/26/16 1341

## 2016-12-26 NOTE — ED Notes (Signed)
94% with good wave form with treatment.     Arlana Lindau, [place]  12/26/16 1328

## 2016-12-26 NOTE — ED Provider Notes (Signed)
EMERGENCY MEDICINE SIGN-OUT    EMERGENCY DEPARTMENT COURSE:     The pt has hx of asthma exacerbation required multiple tx and is on a continuous will need admission     Current Medications:   Previous Medications    ATORVASTATIN (LIPITOR) 20 MG TABLET    Take 20 mg by mouth daily    DESVENLAFAXINE SUCCINATE (PRISTIQ) 100 MG TB24 EXTENDED RELEASE TABLET    Take by mouth daily    ESOMEPRAZOLE MAGNESIUM (NEXIUM) 40 MG PACK    Take 40 mg by mouth daily    FAMOTIDINE (PEPCID) 20 MG TABLET    Take 20 mg by mouth 2 times daily as needed    FLUTICASONE (FLONASE) 50 MCG/ACT NASAL SPRAY    1 spray by Nasal route daily    FOLIC ACID (FOLVITE) 1 MG TABLET    Take 1 mg by mouth daily    HYDROXYZINE (VISTARIL) 25 MG CAPSULE    Take 25 mg by mouth 3 times daily as needed for Itching    LISINOPRIL (PRINIVIL;ZESTRIL) 5 MG TABLET    Take 5 mg by mouth daily    LORAZEPAM (ATIVAN) 1 MG TABLET    Take 1 mg by mouth every 8 hours as needed for Anxiety    METFORMIN (GLUCOPHAGE) 500 MG TABLET    Take 500 mg by mouth 2 times daily (with meals)    MONTELUKAST (SINGULAIR) 10 MG TABLET    Take 10 mg by mouth nightly    PALIPERIDONE PALMITATE (INVEGA SUSTENNA) 234 MG/1.5ML SUSP IM INJECTION    Inject 234 mg into the muscle every 30 days    POLYETHYLENE GLYCOL (GLYCOLAX) PACKET    Take 17 g by mouth daily as needed for Constipation    ROPINIROLE (REQUIP) 1 MG TABLET    Take 1 tablet by mouth 3 times daily In addition to a 2 mg tablet for a total of 3 mg TID    ROPINIROLE (REQUIP) 2 MG TABLET    TAKE 1 TABLET BY MOUTH 3 TIMES DAILY    SENNA-DOCUSATE (PERICOLACE) 8.6-50 MG PER TABLET    Take 1 tablet by mouth daily       Likely Diagnosis and Pending tests   Pending a bed    Probable DISPOSITION:       Pending bed    Vickie EpleyWilliam Kaesen Rodriguez, DO  12/26/2016  6:09 PM            Vickie EpleyWilliam Carlyne Keehan, DO  12/26/16 1809

## 2016-12-26 NOTE — ED Notes (Signed)
[facility].  Patient can be transported when room is clean.   I am going to put her clothes (wet with urine) in the BHI washer.       Effie BerkshireAbby J Lakindra Wible, RN  12/26/16 [phone]

## 2016-12-26 NOTE — ED Provider Notes (Addendum)
[facility] [facility]     Emergency Department     Faculty Attestation    I performed a history and physical examination of the patient and discussed management with the resident. I reviewed the resident's note and agree with the documented findings and plan of care. Any areas of disagreement are noted on the chart. I was personally present for the key portions of any procedures. I have documented in the chart those procedures where I was not present during the key portions. I have reviewed the emergency nurses triage note. I agree with the chief complaint, past medical history, past surgical history, allergies, medications, social and family history as documented unless otherwise noted below. For Physician Assistant/ Nurse Practitioner cases/documentation I have personally evaluated this patient and have completed at least one if not all key elements of the E/M (history, physical exam, and MDM). Additional findings are as noted.    History of asthma and Parkinson's disease, patient is very tight, moderate expiratory distress, diffuse expiratory wheezes, heart regular rate and rhythm, no lower extremity pain or swelling on examination.  Sharen CounterJohn Elaya Droege MD  Attending Emergency  Physician              Hurshel PartyJohn A Milburn Freeney, MD  12/26/16 1332  Patient is feeling much better at this time, speaking in full sentences, still has diffuse expiratory wheezes.    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

## 2016-12-26 NOTE — ED Notes (Signed)
Meal tray faxed awaiting bed assignment.     Arlana LindauShelby Szych, [place]  12/26/16 [phone]

## 2016-12-27 MED ORDER — IPRATROPIUM-ALBUTEROL 0.5-2.5 (3) MG/3ML IN SOLN
RESPIRATORY_TRACT | Status: DC | PRN
Start: 2016-12-27 — End: 2016-12-30
  Administered 2016-12-27: 05:00:00 1 via RESPIRATORY_TRACT

## 2016-12-27 MED ORDER — ROPINIROLE HCL 1 MG PO TABS
1 MG | Freq: Three times a day (TID) | ORAL | Status: DC
Start: 2016-12-27 — End: 2016-12-31
  Administered 2016-12-27 – 2016-12-31 (×15): 3 mg via ORAL

## 2016-12-27 MED ORDER — ALBUTEROL SULFATE HFA 108 (90 BASE) MCG/ACT IN AERS
108 (90 Base) MCG/ACT | RESPIRATORY_TRACT | Status: DC | PRN
Start: 2016-12-27 — End: 2016-12-29

## 2016-12-27 MED ORDER — ROPINIROLE HCL 1 MG PO TABS
1 MG | Freq: Three times a day (TID) | ORAL | Status: DC
Start: 2016-12-27 — End: 2016-12-27

## 2016-12-27 MED ORDER — DESVENLAFAXINE SUCCINATE ER 50 MG PO TB24
50 MG | Freq: Every evening | ORAL | Status: DC
Start: 2016-12-27 — End: 2016-12-27

## 2016-12-27 MED ORDER — ALBUTEROL SULFATE (2.5 MG/3ML) 0.083% IN NEBU
RESPIRATORY_TRACT | Status: DC | PRN
Start: 2016-12-27 — End: 2016-12-30
  Administered 2016-12-27 – 2016-12-28 (×2): 2.5 mg via RESPIRATORY_TRACT

## 2016-12-27 MED ORDER — MAGNESIUM HYDROXIDE 400 MG/5ML PO SUSP
400 MG/5ML | Freq: Every day | ORAL | Status: DC | PRN
Start: 2016-12-27 — End: 2016-12-31

## 2016-12-27 MED ORDER — LISINOPRIL 10 MG PO TABS
10 MG | Freq: Every day | ORAL | Status: DC
Start: 2016-12-27 — End: 2016-12-31
  Administered 2016-12-27 – 2016-12-28 (×2): 5 mg via ORAL
  Administered 2016-12-29: 14:00:00 10 mg via ORAL
  Administered 2016-12-30 – 2016-12-31 (×2): 5 mg via ORAL

## 2016-12-27 MED ORDER — IPRATROPIUM-ALBUTEROL 0.5-2.5 (3) MG/3ML IN SOLN
RESPIRATORY_TRACT | Status: DC
Start: 2016-12-27 — End: 2016-12-27

## 2016-12-27 MED ORDER — QUETIAPINE FUMARATE 100 MG PO TABS
100 MG | Freq: Once | ORAL | Status: AC
Start: 2016-12-27 — End: 2016-12-27
  Administered 2016-12-27: 06:00:00 100 mg via ORAL

## 2016-12-27 MED ORDER — MONTELUKAST SODIUM 10 MG PO TABS
10 MG | Freq: Every evening | ORAL | Status: DC
Start: 2016-12-27 — End: 2016-12-31
  Administered 2016-12-27 – 2016-12-31 (×5): 10 mg via ORAL

## 2016-12-27 MED ORDER — NICOTINE 21 MG/24HR TD PT24
21 MG/24HR | Freq: Every day | TRANSDERMAL | Status: DC
Start: 2016-12-27 — End: 2016-12-31
  Administered 2016-12-27 – 2016-12-31 (×5): 1 via TRANSDERMAL

## 2016-12-27 MED ORDER — ENOXAPARIN SODIUM 40 MG/0.4ML SC SOLN
40 | Freq: Every day | SUBCUTANEOUS | Status: DC
Start: 2016-12-27 — End: 2016-12-31
  Administered 2016-12-27 – 2016-12-31 (×5): 40 mg via SUBCUTANEOUS

## 2016-12-27 MED ORDER — NORMAL SALINE FLUSH 0.9 % IV SOLN
0.9 | INTRAVENOUS | Status: DC | PRN
Start: 2016-12-27 — End: 2016-12-31

## 2016-12-27 MED ORDER — PANTOPRAZOLE SODIUM 40 MG PO TBEC
40 MG | Freq: Every day | ORAL | Status: DC
Start: 2016-12-27 — End: 2016-12-31
  Administered 2016-12-27 – 2016-12-31 (×5): 40 mg via ORAL

## 2016-12-27 MED ORDER — ATORVASTATIN CALCIUM 20 MG PO TABS
20 MG | Freq: Every evening | ORAL | Status: DC
Start: 2016-12-27 — End: 2016-12-31
  Administered 2016-12-27 – 2016-12-31 (×5): 20 mg via ORAL

## 2016-12-27 MED ORDER — METFORMIN HCL 500 MG PO TABS
500 MG | Freq: Two times a day (BID) | ORAL | Status: DC
Start: 2016-12-27 — End: 2016-12-31
  Administered 2016-12-27 – 2016-12-31 (×9): 500 mg via ORAL

## 2016-12-27 MED ORDER — ONDANSETRON HCL 4 MG/2ML IJ SOLN
4 | Freq: Four times a day (QID) | INTRAMUSCULAR | Status: DC | PRN
Start: 2016-12-27 — End: 2016-12-31

## 2016-12-27 MED ORDER — SENNA-DOCUSATE SODIUM 8.6-50 MG PO TABS
Freq: Every day | ORAL | Status: DC
Start: 2016-12-27 — End: 2016-12-31
  Administered 2016-12-28 – 2016-12-31 (×4): 1 via ORAL

## 2016-12-27 MED ORDER — IPRATROPIUM-ALBUTEROL 0.5-2.5 (3) MG/3ML IN SOLN
Freq: Four times a day (QID) | RESPIRATORY_TRACT | Status: DC
Start: 2016-12-27 — End: 2016-12-31
  Administered 2016-12-27 – 2016-12-31 (×17): 1 via RESPIRATORY_TRACT

## 2016-12-27 MED ORDER — LORAZEPAM 0.5 MG PO TABS
0.5 MG | Freq: Three times a day (TID) | ORAL | Status: DC | PRN
Start: 2016-12-27 — End: 2016-12-31
  Administered 2016-12-27 – 2016-12-31 (×11): 1 mg via ORAL

## 2016-12-27 MED ORDER — FLUTICASONE PROPIONATE 50 MCG/ACT NA SUSP
50 MCG/ACT | Freq: Every day | NASAL | Status: DC
Start: 2016-12-27 — End: 2016-12-31
  Administered 2016-12-27 – 2016-12-31 (×5): 1 via NASAL

## 2016-12-27 MED ORDER — PREDNISONE 20 MG PO TABS
20 MG | Freq: Every day | ORAL | Status: AC
Start: 2016-12-27 — End: 2016-12-31
  Administered 2016-12-27 – 2016-12-31 (×5): 40 mg via ORAL

## 2016-12-27 MED ORDER — NORMAL SALINE FLUSH 0.9 % IV SOLN
0.9 % | Freq: Two times a day (BID) | INTRAVENOUS | Status: DC
Start: 2016-12-27 — End: 2016-12-31
  Administered 2016-12-27 – 2016-12-31 (×3): 10 mL via INTRAVENOUS

## 2016-12-27 MED ORDER — LEVOFLOXACIN 500 MG PO TABS
500 MG | Freq: Every day | ORAL | Status: AC
Start: 2016-12-27 — End: 2016-12-31
  Administered 2016-12-27 – 2016-12-31 (×5): 500 mg via ORAL

## 2016-12-27 MED ORDER — ESOMEPRAZOLE MAGNESIUM 40 MG PO PACK
40 MG | Freq: Every day | ORAL | Status: DC
Start: 2016-12-27 — End: 2016-12-26

## 2016-12-27 MED ORDER — FOLIC ACID 1 MG PO TABS
1 MG | Freq: Every day | ORAL | Status: DC
Start: 2016-12-27 — End: 2016-12-31
  Administered 2016-12-27 – 2016-12-31 (×5): 1 mg via ORAL

## 2016-12-27 MED ORDER — INFLUENZA VAC SPLIT QUAD 0.5 ML IM SUSY
0.5 ML | Freq: Once | INTRAMUSCULAR | Status: AC
Start: 2016-12-27 — End: 2016-12-28
  Administered 2016-12-28: 15:00:00 0.5 mL via INTRAMUSCULAR

## 2016-12-27 MED ORDER — HYDROXYZINE HCL 25 MG PO TABS
25 MG | Freq: Three times a day (TID) | ORAL | Status: DC | PRN
Start: 2016-12-27 — End: 2016-12-31
  Administered 2016-12-29 – 2016-12-31 (×2): 25 mg via ORAL

## 2016-12-27 MED FILL — MONTELUKAST SODIUM 10 MG PO TABS: 10 MG | ORAL | Qty: 1

## 2016-12-27 MED FILL — METFORMIN HCL 500 MG PO TABS: 500 MG | ORAL | Qty: 1

## 2016-12-27 MED FILL — LIPITOR 20 MG PO TABS: 20 MG | ORAL | Qty: 1

## 2016-12-27 MED FILL — ROPINIROLE HCL 1 MG PO TABS: 1 MG | ORAL | Qty: 3

## 2016-12-27 MED FILL — PANTOPRAZOLE SODIUM 40 MG PO TBEC: 40 MG | ORAL | Qty: 1

## 2016-12-27 MED FILL — IPRATROPIUM-ALBUTEROL 0.5-2.5 (3) MG/3ML IN SOLN: RESPIRATORY_TRACT | Qty: 3

## 2016-12-27 MED FILL — LISINOPRIL 10 MG PO TABS: 10 MG | ORAL | Qty: 1

## 2016-12-27 MED FILL — ALBUTEROL SULFATE (2.5 MG/3ML) 0.083% IN NEBU: RESPIRATORY_TRACT | Qty: 3

## 2016-12-27 MED FILL — LORAZEPAM 0.5 MG PO TABS: 0.5 MG | ORAL | Qty: 2

## 2016-12-27 MED FILL — PREDNISONE 20 MG PO TABS: 20 MG | ORAL | Qty: 2

## 2016-12-27 MED FILL — PROVENTIL HFA 108 (90 BASE) MCG/ACT IN AERS: 108 (90 Base) MCG/ACT | RESPIRATORY_TRACT | Qty: 2.66

## 2016-12-27 MED FILL — HYDROXYZINE HCL 25 MG PO TABS: 25 MG | ORAL | Qty: 1

## 2016-12-27 MED FILL — IPRATROPIUM-ALBUTEROL 0.5-2.5 (3) MG/3ML IN SOLN: RESPIRATORY_TRACT | Qty: 9

## 2016-12-27 MED FILL — PRISTIQ 50 MG PO TB24: 50 MG | ORAL | Qty: 2

## 2016-12-27 MED FILL — SENNOSIDES-DOCUSATE SODIUM 8.6-50 MG PO TABS: ORAL | Qty: 1

## 2016-12-27 MED FILL — ENOXAPARIN SODIUM 40 MG/0.4ML SC SOLN: 40 MG/0.4ML | SUBCUTANEOUS | Qty: 0.4

## 2016-12-27 MED FILL — SEROQUEL 100 MG PO TABS: 100 MG | ORAL | Qty: 1

## 2016-12-27 MED FILL — LEVOFLOXACIN 500 MG PO TABS: 500 MG | ORAL | Qty: 1

## 2016-12-27 MED FILL — FLUTICASONE PROPIONATE 50 MCG/ACT NA SUSP: 50 MCG/ACT | NASAL | Qty: 16

## 2016-12-27 MED FILL — FOLIC ACID 1 MG PO TABS: 1 MG | ORAL | Qty: 1

## 2016-12-27 MED FILL — NICOTINE 21 MG/24HR TD PT24: 21 MG/24HR | TRANSDERMAL | Qty: 1

## 2016-12-27 NOTE — Plan of Care (Signed)
Problem: Falls - Risk of:  Goal: Will remain free from falls  Will remain free from falls   Outcome: Ongoing  Siderails up x 2  Hourly rounding  Call light in reach  Instructed to call for assist before attempting out of bed.  Remains free from falls and accidental injury at this time   Floor free from obstacles  Bed is locked and in lowest position  Adequate lighting provided  Bed alarm on, Red [place] star and Stay with Me signs posted

## 2016-12-27 NOTE — ED Notes (Signed)
Clothes moved from washer to dryer in BHI (ED).      Effie Berkshire, RN  12/27/16 0110

## 2016-12-27 NOTE — Progress Notes (Signed)
Shannon MinerAndrew R KasemanPatient Assessment complete. Asthma exacerbation, mild [J45.901] .   Vitals:    12/27/16 0045   BP: 139/71   Pulse: 95   Resp: 16   Temp: 97.7 ??F (36.5 ??C)   SpO2: 95%   . Patients home meds are   Prior to Admission medications    Medication Sig Start Date End Date Taking? Authorizing Provider   QUEtiapine (SEROQUEL) 100 MG tablet Take 100 mg by mouth nightly   Yes Historical Provider, MD   rOPINIRole (REQUIP) 2 MG tablet TAKE 1 TABLET BY MOUTH 3 TIMES DAILY 10/19/16   Foy Guadalajaraheryl A McGarry, APRN - CNP   rOPINIRole (REQUIP) 1 MG tablet Take 1 tablet by mouth 3 times daily In addition to a 2 mg tablet for a total of 3 mg TID 09/28/16   Foy Guadalajaraheryl A McGarry, APRN - CNP   desvenlafaxine succinate (PRISTIQ) 100 MG TB24 extended release tablet Take by mouth daily    Historical Provider, MD   folic acid (FOLVITE) 1 MG tablet Take 1 mg by mouth daily    Historical Provider, MD   famotidine (PEPCID) 20 MG tablet Take 20 mg by mouth 2 times daily as needed    Historical Provider, MD   polyethylene glycol (GLYCOLAX) packet Take 17 g by mouth daily as needed for Constipation    Historical Provider, MD   paliperidone palmitate (INVEGA SUSTENNA) 234 MG/1.5ML SUSP IM injection Inject 234 mg into the muscle every 30 days    Historical Provider, MD   fluticasone (FLONASE) 50 MCG/ACT nasal spray 1 spray by Nasal route daily    Historical Provider, MD   montelukast (SINGULAIR) 10 MG tablet Take 10 mg by mouth nightly    Historical Provider, MD   esomeprazole Magnesium (NEXIUM) 40 MG PACK Take 40 mg by mouth daily    Historical Provider, MD   senna-docusate (PERICOLACE) 8.6-50 MG per tablet Take 1 tablet by mouth daily    Historical Provider, MD   atorvastatin (LIPITOR) 20 MG tablet Take 20 mg by mouth daily    Historical Provider, MD   LORazepam (ATIVAN) 1 MG tablet Take 1 mg by mouth every 8 hours as needed for Anxiety    Historical Provider, MD   metFORMIN (GLUCOPHAGE) 500 MG tablet Take 500 mg by mouth 2 times daily (with  meals)    Historical Provider, MD   hydrOXYzine (VISTARIL) 25 MG capsule Take 25 mg by mouth 3 times daily as needed for Itching    Historical Provider, MD   lisinopril (PRINIVIL;ZESTRIL) 5 MG tablet Take 5 mg by mouth daily    Historical Provider, MD   .            RR 20  Breath Sounds: expiratory wheezes throughout       ?? Bronchodilator assessment at level  3  ?? Hyperinflation assessment at level   ?? Secretion Management assessment at level    ??   ?? [x]     Bronchodilator Assessment  BRONCHODILATOR ASSESSMENT SCORE  Score 0 1 2 3 4 5    Breath Sounds   []   Patient Baseline []   No Wheeze good aeration []   Faint, scattered wheezing, good aeration [x]   Expiratory Wheezing and or moderately diminished []   Insp/Exp wheeze and/or very diminished []   Insp/Exp and/ or marked distress   Respiratory Rate   []   Patient Baseline []   Less than 20 []   Less than 20 [x]   20-25 []   Greater than 25 []   Greater  than 25   Peak flow % of Pred or PB   NA     Greater than 90%    81-90%   71-80%   Less than or equal to 70%  or unable to perform   Unable due to Respiratory Distress   Dyspnea re   Patient Baseline   No SOB   No SOB   SOB on exertion   SOB min activity   At rest/acute   e FEV% Predicted         NA   Above 69%    Unable   Above 60-69%    Unable   Above 50-59%    Unable   Above 35-49%    Unable   Less than 35%    Unable                   Hyperinflation Assessment  Score CXR and Breath Sounds     Clear   No atelectasis  Basilar aeration   Atelectasis or absent basilar breath sounds   Incentive Spirometry Volume  (Per IBW)     Greater than or equal to 75ml/Kg   less than 52ml/Kg   less than 26ml/Kg   Surgery within last 2 weeks   None or general     Abdominal or thoracic surgery    Abdominal or thoracic   Chronic Pulmonary Historyre   No   Yes   Yes       Secretion Management Assessment  Score Bilateral Breath Sounds     Occasional  Rhonchi   Scattered Rhonchi   Course Rhonchi and/or poor aeration   Sputum      Small amount of thin secretions   Moderate amount of viscous secretions   Copius, Viscious Yellow/ Secretions   CXR as reported by physician   clear    Unavailable   Infiltrates and/or consolidation    Unavailable   Mucus Plugging and or lobar consolidation    Unavailable   Cough   Strong, productive cough   Weak productive cough   No cough or weak non-productive cough   Shannon Allison  1:32 AM                            FEMALE                                  FEMALE                            FEV1 Predicted Normal Values                        FEV1 Predicted Normal Values          Age                                     Height in Feet and Inches       Age                                     Height in Feet and Inches  4' 11"  5' 1"  5' 3"  5' 5"  5' 7"  5' 9"  5' 11"  6' 1"   4' 11"  5' 1"  5' 3"  5' 5"  5' 7"  5' 9"  5' 11"  6' 1"    42 - 45 2.49 2.66 2.84 3.03 3.22 3.42 3.62 3.83 42 - 45 2.82 3.03 3.26 3.49 3.72 3.96 4.22 4.47   46 - 49 2.40 2.57 2.76 2.94 3.14 3.33 3.54 3.75 46 - 49 2.70 2.92 3.14 3.37 3.61 3.85 4.10 4.36   50 - 53 2.31 2.48 2.66 2.85 3.04 3.24 3.45 3.66 50 - 53 2.58 2.80 3.02 3.25 3.49 3.73 3.98 4.24   54 - 57 2.21 2.38 2.57 2.75 2.95 3.14 3.35 3.56 54 - 57 2.46 2.67 2.89 3.12 3.36 3.60 3.85 4.11   58 - 61 2.10 2.28 2.46 2.65 2.84 3.04 3.24 3.45 58 - 61 2.32 2.54 2.76 2.99 3.23 3.47 3.72 3.98   62 - 65 1.99 2.17 2.35 2.54 2.73 2.93 3.13 3.34 62 - 65 2.19 2.40 2.62 2.85 3.09 3.33 3.58 3.84   66 - 69 1.88 2.05 2.23 2.42 2.61 2.81 3.02 3.23 66 - 69 2.04 2.26 2.48 2.71 2.95 3.19 3.44 3.70   70+ 1.82 1.99 2.17 2.36 2.55 2.75 2.95 3.16 70+ 1.97 2.19 2.41 2.64 2.87 3.12 3.37 3.62             Predicted Peak Expiratory Flow Rate                                       Height (in)  Female       Height (in) Female           Age 61 2 2 62 52 66 90 70 Age            20 344 357 372 387 402 417 432 446  60 62 64 66  68 70 72 74 76   25 337 352 366 381 396 411  426 441 25 447 476 505 533 562 591 619 648 677   30 329 344 359 374 389 404 419 434 30 437 466 494  523 552 580 609 638 667   35 322 337 351 366 381 396 411 426 35 426 455 484 512 541 570 598 627 657    40 314 329 344 359 374 389 404 419 40 416 445 473 502 531 559 588 617 647   45 307 322 336 351 366  381 396 411 45 405 434 463 491 520 549 577 606 636   50 299 314 329 344 359 374 389 404 50 395 424  452 481 510 538 567 596 625   55 292 307 321 336 351 366 381 396 55 384 413 442 470 499 528 556 585  615   60 284 299 314 329 344 359 374 389 60 374 403 431 460 489 517 546 575 605   65 277 292 306 321  336 351 366 381 65 363 392 421 449 478 507 535 564 594   70 269 284 299 314 329 344 359 374 70 353  382 410 439 468 496 525 554 583   75 261 274 289 305 319 334 348 364 75 344 372 400 429 458 487 515  544 573   80 253 266 282 296 312 327 342 356 80  335 362 390 419 448 476 505 534 562

## 2016-12-27 NOTE — Progress Notes (Addendum)
Physical Therapy    Facility/Department: [facility] NEURO  Initial Assessment    NAME: Shannon Allison  DOB: Dec 31, 1960  MRN: [id number]    Date of Service: 12/27/2016    Discharge Recommendations:  24 hour supervision or assist, Outpatient PT   PT Equipment Recommendations  Equipment Needed: Yes  Mobility Devices: Lyda Perone: Set designer Complaint   Patient presents with   . Shortness of Breath     2 days ongoing. Hx of Asthma no relief from inhaler.   . Back Pain       Patient Diagnosis(es): The encounter diagnosis was Intermittent asthma with acute exacerbation, unspecified asthma severity.     has a past medical history of Asthma and Diabetes mellitus (HCC).   has a past surgical history that includes Nasal septum surgery (Bilateral); pr esophagogastroduodenoscopy transoral diagnostic (N/A, 01/04/2016); pr colon ca scrn not hi rsk ind (01/04/2016); and pr colon ca scrn not hi rsk ind (N/A, 02/10/2016).    Restrictions  Restrictions/Precautions  Restrictions/Precautions: General Precautions, Fall Risk, Contact Precautions  Required Braces or Orthoses?: No  Vision/Hearing  Vision: Within Functional Limits  Hearing: Within functional limits     Subjective  General  Patient assessed for rehabilitation services?: Yes  Family / Caregiver Present: No  Follows Commands: Within Functional Limits  Subjective  Subjective: Pt resting in bed when therapy entered, agreeable to ambulate. Denies pain.   Pain Screening  Patient Currently in Pain: Denies  Vital Signs  Patient Currently in Pain: Denies       Orientation  Orientation  Overall Orientation Status: Within Normal Limits    Social/Functional History  Social/Functional History  Lives With: Other (comment)  Type of Home:  (Group home)  Home Layout: Two level, Performs ADL's on one level, Able to Live on Main level with bedroom/bathroom  Home Access: Stairs to enter with rails  Entrance Stairs - Number of Steps: 2-3, 1 sided rail  Bathroom Accessibility: Accessible  Home  Equipment:  (none)  Receives Help From: Personal care attendant  ADL Assistance: Independent  Homemaking Responsibilities: No  Ambulation Assistance: Independent  Transfer Assistance: Surveyor, minerals: No  Mode of Transportation: Cab  Occupation: Unemployed, On disability  Additional Comments: Pt completes OP PT for "weak knees"  Objective     Observation/Palpation  Observation: Resting tremors in LUE, baseline per pt d/t parkinsons    AROM RLE (degrees)  RLE AROM: WFL  AROM LLE (degrees)  LLE AROM : WFL  AROM RUE (degrees)  RUE AROM : WFL  AROM LUE (degrees)  LUE AROM : WFL  Strength RLE  Strength RLE: WFL  Strength LLE  Strength LLE: WFL  Strength RUE  Comment: Grossly 4+/5  Strength LUE  Comment: Grossly 4+/5        Bed mobility  Supine to Sit: Modified independent  Sit to Supine: Independent  Scooting: Independent  Transfers  Sit to Stand: Contact guard assistance  Stand to sit: Contact guard assistance     Ambulation  Ambulation?: Yes  Ambulation 1  Surface: level tile  Device: Rolling Walker  Other Apparatus: O2 (2L)  Assistance: Minimal assistance  Quality of Gait: Mina required to correct RW to the center as pt tends to drift toward the L. RLE severely ER, pt states that is normal for her  Distance: 139ft  Comments: Pt ambulated 60ft without RW requring Mina d/t unsteadiness; pt safer with RW      Balance  Posture: Good  Sitting -  Static: Good  Sitting - Dynamic: Good  Standing - Static: Good;-  Standing - Dynamic: Fair;+        Assessment   Body structures, Functions, Activity limitations: Decreased functional mobility ;Decreased ROM;Decreased strength;Decreased endurance  Assessment: Pt able to ambulate 132ft with RW and MinA, pt safer while using RW and is tehrefore recommended for home use. Pt will beneit from continued acute PT and Outpatient PT upon discharge.  (co-eval with OT)  Prognosis: Good  Decision Making: Medium Complexity  Patient Education: PT POC  REQUIRES PT FOLLOW UP:  Yes  Activity Tolerance  Activity Tolerance: Patient Tolerated treatment well         Plan   Plan  Times per week: 5-6x/wk  Current Treatment Recommendations: Strengthening, ROM, Balance Training, Building services engineer, Teacher, early years/pre, Investment banker, operational, Endurance Training  Safety Devices  Type of devices: Call light within reach, Gait belt, Left in bed    G-Code  PT G-Codes  Functional Assessment Tool Used: [place]  Score: CJ  Functional Limitation: Mobility: Walking and moving around  Mobility: Walking and Moving Around Current Status [phone]): At least 20 percent but less than 40 percent impaired, limited or restricted  Mobility: Walking and Moving Around Goal Status [phone]): At least 1 percent but less than 20 percent impaired, limited or restricted  OutComes Score                                           AM-PAC Score  AM-PAC Inpatient Mobility Raw Score : 20  AM-PAC Inpatient T-Scale Score : 47.67  Mobility Inpatient CMS 0-100% Score: 35.83  Mobility Inpatient CMS G-Code Modifier : CJ          Goals  Short term goals  Time Frame for Short term goals: 14 visits  Short term goal 1: Pt to ambulate 345ft with RW and CGA  Short term goal 2: Pt to tolerate at elast of UE/LE exercises in order to increase overall strength  Short term goal 3: Pt to perform bed mobility and transfers independently       Therapy Time   Individual Concurrent Group Co-treatment   Time In 0916         Time Out 0930         Minutes [address] [place], [place]

## 2016-12-27 NOTE — Progress Notes (Signed)
Occupational Therapy   Occupational Therapy Initial Assessment  Date: 12/27/2016   Patient Name: Shannon Allison  MRN: [id number]     DOB: 07-12-1960    Date of Service: 12/27/2016    Discharge Recommendations:  Home with assist PRN (Return to group home with RW, continue services)  OT Equipment Recommendations  Equipment Needed: Yes  Mobility Devices: Lyda Perone: Rolling    Patient Diagnosis(es): The encounter diagnosis was Intermittent asthma with acute exacerbation, unspecified asthma severity.     has a past medical history of Asthma and Diabetes mellitus (HCC).   has a past surgical history that includes Nasal septum surgery (Bilateral); pr esophagogastroduodenoscopy transoral diagnostic (N/A, 01/04/2016); pr colon ca scrn not hi rsk ind (01/04/2016); and pr colon ca scrn not hi rsk ind (N/A, 02/10/2016).         Restrictions  Restrictions/Precautions  Restrictions/Precautions: General Precautions, Fall Risk, Contact Precautions  Required Braces or Orthoses?: No    Subjective   General  Patient assessed for rehabilitation services?: Yes  Family / Caregiver Present: No  Diagnosis: Asthma exacerbation  Pain Assessment  Patient Currently in Pain: Denies  Pain Assessment: 0-10  Pain Level: 0     Social/Functional History  Social/Functional History  Lives With: Other (comment)  Type of Home:  (Group home)  Home Layout: Two level, Performs ADL's on one level, Able to Live on Main level with bedroom/bathroom  Home Access: Stairs to enter with rails  Entrance Stairs - Number of Steps: 2-3, 1 sided rail  Bathroom Accessibility: Accessible  Home Equipment:  (none)  Receives Help From: Personal care attendant  ADL Assistance: Independent  Homemaking Responsibilities:  (Pt states caregiver takes care of most cleaning)  Ambulation Assistance: Independent  Transfer Assistance: Independent  Active Driver: No  Mode of Transportation: Cab  Occupation: Unemployed, On disability  Additional Comments: Pt completes OP PT for "weak  knees"     Objective   Vision: Within Functional Limits  Hearing: Within functional limits    Orientation  Overall Orientation Status: Within Functional Limits     Balance  Sitting Balance: Modified independent   Standing Balance: Supervision  Standing Balance  Time: 9 min  Activity: Pt stood bedside, func mob for household distances  Sit to stand: Modified independent  Stand to sit: Independent  Functional Mobility  Functional - Mobility Device: Rolling Walker  Activity: To/from bathroom  Assist Level: Stand by assistance  Functional Mobility Comments: CGA needed when no RW used in room, walks to L consistently during func mob despite vc's to correct, baseline external rotation of R foot noted  ADL  Feeding: Independent;Setup  Grooming: Modified independent ;Setup;Increased time to complete  UE Bathing: Modified independent   LE Bathing: Modified independent   UE Dressing: Supervision  LE Dressing: Supervision  Toileting: Supervision;Increased time to complete  Tone RUE  RUE Tone: Normotonic  Tone LUE  LUE Tone: Normotonic  Coordination  Movements Are Fluid And Coordinated: No  Coordination and Movement description: Decreased speed     Bed mobility  Supine to Sit: Modified independent  Sit to Supine: Independent  Scooting: Independent  Transfers  Sit to stand: Modified independent  Stand to sit: Independent     Cognition  Overall Cognitive Status: Exceptions  Cognition Comment: Baseline delayed cognition, from group home, functional thinking noted     LUE AROM (degrees)  LUE AROM : WFL  RUE AROM (degrees)  RUE AROM : WFL  LUE Strength  Gross LUE Strength: WFL  L Shoulder Flex: 4+/5  L Elbow Flex: 4+/5  L Elbow Ext: 4+/5  L Hand Grasp: 4+/5  L Hand Release: 4+/5  RUE Strength  Gross RUE Strength: WFL  R Shoulder Flex: 4+/5  R Elbow Flex: 4+/5  R Elbow Ext: 4+/5  R Hand Grasp: 4+/5  R Hand Release: 4+/5     Assessment   Performance deficits / Impairments: Decreased functional mobility ;Decreased endurance;Decreased  ADL status;Decreased high-level IADLs;Decreased balance;Decreased safe awareness  Prognosis: Good  Decision Making: Medium Complexity  Patient Education: Safety awareness, OTPOC, discharge rec-good return  REQUIRES OT FOLLOW UP: Yes  Activity Tolerance  Activity Tolerance: Patient Tolerated treatment well  Safety Devices  Safety Devices in place: Yes  Type of devices: Left in bed;All fall risk precautions in place;Nurse notified;Call light within reach;Gait belt  Restraints  Initially in place: No         Plan   Plan  Times per week: 3x  Current Treatment Recommendations: Location manager, Building services engineer, Safety Education & Training, Self-Care / ADL, Home Management Training, Patient/Caregiver Education & Training, Mining engineer, Education, & procurement    G-Code  OT G-codes  Functional Assessment Tool Used: [place] AMPAC  Score: 21/24  Functional Limitation: Self care  Self Care Current Status [phone]): At least 20 percent but less than 40 percent impaired, limited or restricted  Self Care Goal Status ([id number]): At least 1 percent but less than 20 percent impaired, limited or restricted  How much help from another person does the pt currently need? Unable A Lot A Little None   1. Putting on and taking off regular lower body clothing?      1      2      3       4    2. Bathing (including washing, rinsing, drying)?      1      2      3      4    3. Toileting, which includes using toilet, bedpan, or urinal?      1      2        3      4    4. Putting on and taking off regular upper body clothing?      1      2      3       4    5. Taking care of personal grooming such as brushing teeth?      1      2      3      4    6. Eating meals?      1      2       3       4      1. Unable = Total/Dependent Assist  2. A Lot = Maximum/Moderate Assist  3. A Little = Minimum/Contact Guard Assist/Supervision  4. None= Modified Independence/Independent    Raw Score Scale Score Scale Score Standard Error Approximate Degree  of Functional Impairment     6 17.07 3.74 100%   7 20.13 3.68 92%   8 22.86 3.43 86%   9 25.33 3.17 80%   10 27.31 2.96 75%   11 29.04 2.79 70%   12 30.60 2.68 67%   13 32.03 2.62 63%   14 33.39 2.61 60%   15 34.69 2.65 56%   16 35.96 2.71 53%   17 37.26 2.82 50%  18 38.66 2.97 47%   19 40.22 3.20 43%   20 42.03 3.55 38%   21 44.27 4.08 33%   22 47.10 4.81 26%   23 51.12 5.88 16%   24 57.54 7.36 0%       Goals  Short term goals  Time Frame for Short term goals: Pt will by discharge   Short term goal 1: demo supine<>sit transfer (I)  Short term goal 2: demo good safety awareness during func mob around room with no AD and (I)  Short term goal 3: demo ADL UB/LB dressing/bathing activity standing with seated rest breaks PRN and mod I  Short term goal 4: demo bending/reaching func activity while standing with mod I to simulate home       Therapy Time   Individual Concurrent Group Co-treatment   Time In 0919         Time Out 0930         Minutes 11            Discharge recommendations discussed with patient during initial evaluation.      Alice RiegerAndrew Guneet Delpino, OTR/L

## 2016-12-27 NOTE — Progress Notes (Signed)
Smoking Cessation - topics covered   []  Health Risks  []  Benefits of Quitting   []  Smoking Cessation  []  Patient has no history of tobacco use  []  Patient is former smoker.      []  No need for tobacco cessation education.   []  Booklet given  [x]  Patient verbalizes understanding.  [x]  Patient denies need for tobacco cessation education.  Derrica Sieg  11:54 AM

## 2016-12-27 NOTE — Plan of Care (Signed)
Problem: RESPIRATORY  Intervention: Respiratory assessment  BRONCHOSPASM/BRONCHOCONSTRICTION   [x]  IMPROVE AERATION/BREATH SOUNDS  [x]   ADMINISTER BRONCHODILATOR THERAPY AS APPROPRIATE  [x]   ASSESS BREATH SOUNDS  []   IMPLEMENT AEROSOL/MDI PROTOCOL  [x]   PATIENT EDUCATION AS NEEDED

## 2016-12-27 NOTE — Progress Notes (Signed)
BRONCHOSPASM/BRONCHOCONSTRICTION     [x]         IMPROVE AERATION/BREATH SOUNDS  [x]   ADMINISTER BRONCHODILATOR THERAPY AS APPROPRIATE  [x]   ASSESS BREATH SOUNDS  [x]   IMPLEMENT AEROSOL/MDI PROTOCOL  [x]   PATIENT EDUCATION AS NEEDED

## 2016-12-27 NOTE — Plan of Care (Signed)
Problem: RESPIRATORY  Intervention: Respiratory assessment  BRONCHOSPASM/BRONCHOCONSTRICTION     [x]         IMPROVE AERATION/BREATH SOUNDS  [x]   ADMINISTER BRONCHODILATOR THERAPY AS APPROPRIATE  [x]   ASSESS BREATH SOUNDS  [x]   IMPLEMENT AEROSOL/MDI PROTOCOL  [x]   PATIENT EDUCATION AS NEEDED    PROVIDE ADEQUATE OXYGENATION WITH ACCEPTABLE SP02/ABG'S    [x]  IDENTIFY APPROPRIATE OXYGEN THERAPY  [x]   MONITOR SP02/ABG'S AS NEEDED   [x]   PATIENT EDUCATION AS NEEDED

## 2016-12-27 NOTE — Progress Notes (Signed)
[facility]     Department of Internal Medicine - Staff Internal Medicine Service     DAILY PROGRESS NOTE       Patient:  Shannon Allison  Date of Birth: October 10, 1960  MRN: [id number]     Acct: [id number]     Admit date: 12/26/2016  Admitting Diagnosis: Asthma exacerbation, mild    Subjective:   Patient seen and examined at bedside. No acute events overnight. Alert and oriented. Stable vitally. Using O2 via nasal canula. Patient feels a little short of breath.    Review of Systems   Constitutional: Negative for chills, fever, malaise/fatigue and weight loss.   Respiratory: Positive for shortness of breath. Negative for cough, hemoptysis and sputum production.    Cardiovascular: Negative for chest pain, palpitations, orthopnea and claudication.   Gastrointestinal: Negative for abdominal pain, diarrhea, heartburn, nausea and vomiting.   Genitourinary: Negative for dysuria, frequency, hematuria and urgency.   Musculoskeletal: Negative for back pain, myalgias and neck pain.   Skin: Negative for itching and rash.   Neurological: Negative for tingling, tremors and headaches.     Objective:   BP (!) 102/52   Pulse 97   Temp 98.6 F (37 C) (Oral)   Resp 19   Ht  (1.753 m)   Wt 213 lb 13.5 oz (97 kg)   SpO2 99%   BMI 31.58 kg/m       General appearance - alert, well appearing, and in no distress  Mental status - alert, oriented to person, place, and time  Chest - bilateral wheezes heard  Heart - normal rate, regular rhythm, normal S1, S2, no murmurs, rubs, clicks or gallops  Abdomen - soft, nontender, nondistended, no masses or organomegaly  Neurological - alert, oriented, normal speech, no focal findings or movement disorder noted  Musculoskeletal - no joint tenderness, deformity or swelling  Extremities - peripheral pulses normal, no pedal edema, no clubbing or cyanosis  Skin - normal coloration and turgor, no rashes, no suspicious skin lesions noted    No intake or output data in the 24  hours ending 12/27/16 1238      Medications:     . ipratropium-albuterol  1 ampule Inhalation 4x daily   . [START ON 12/28/2016] influenza virus vaccine  0.5 mL Intramuscular Once   . levofloxacin  500 mg Oral Daily   . nicotine  1 patch Transdermal Daily   . atorvastatin  20 mg Oral Nightly   . desvenlafaxine succinate  100 mg Oral Nightly   . fluticasone  1 spray Nasal Daily   . folic acid  1 mg Oral Daily   . lisinopril  5 mg Oral Daily   . metFORMIN  500 mg Oral BID WC   . montelukast  10 mg Oral Nightly   . rOPINIRole  3 mg Oral TID   . sodium chloride flush  10 mL Intravenous 2 times per day   . enoxaparin  40 mg Subcutaneous Daily   . predniSONE  40 mg Oral Daily   . sennosides-docusate sodium  1 tablet Oral Daily   . pantoprazole  40 mg Oral QAM AC       Diagnostic Labs and Imaging    CBC:   Recent Labs      12/26/16   1409   WBC  7.7   RBC  4.54   HGB  13.1   HCT  40.9   MCV  90.1   RDW  12.9  PLT  245     BMP:   Recent Labs      12/26/16   1341  12/26/16   1409   NA   --   140   K   --   4.2   CL   --   100   CO2   --   28   BUN   --   12   CREATININE  0.55  0.48*     FASTING LIPID PANEL:  Lab Results   Component Value Date    CHOL 143 08/28/2016    HDL 51 08/28/2016    TRIG [id number] 08/28/2016     Assessment and Plan:   Principal Problem:    Asthma exacerbation, mild  Active Problems:    Essential hypertension    Dyslipidemia    Parkinson's disease (HCC)    Type 2 diabetes mellitus, without long-term current use of insulin (HCC)    Gastroesophageal reflux disease without esophagitis  Resolved Problems:    * No resolved hospital problems. *    1. 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 Essential HTN   - On Lisinopril 5mg     3. DM Type 2   - On  Metformin 500 mg BD      4. Hyperlipidemia   - On Atorvastatin 20 mg    5. Parkinsonism   - On Requip 3 mg   - On Seroquel 100 mg   - On Prestiq 100 mg    6. DVT Prophylaxis   - On Lovenox 40 mg SQ    7. Diet   - General Diet      Disposition - Return to group home. Transition to be determined      Laray Angerizwan Ishtiaq, MD  PGY-1, Department of Internal Medicine  [facility] [place]  12/27/2016 12:38 PM    Attending Physician Statement    Active Hospital Problems    Diagnosis Date Noted   . Asthma exacerbation, mild [J45.901] 12/26/2016   . Type 2 diabetes mellitus, without long-term current use of insulin (HCC) [E11.9] 12/26/2016   . Gastroesophageal reflux disease without esophagitis [K21.9] 12/26/2016   . Parkinson's disease (HCC) [G20] 04/24/2016   . Essential hypertension [I10] 02/28/2016   . Dyslipidemia [E78.5] 02/28/2016       I have discussed the case, including pertinent history and exam findings with the resident and the team.  I have seen and examined the patient and the key elements of the encounter have been performed by me.  I agree with the assessment, plan and orders as documented by the resident.    Improving    Cindee Lameawan Jadin Creque-Kasmani, MD, [facility]ACP  [facility]   [facility]  Associate Program Director Department of Internal Medicine  Internal Medicine Clerkship Site Director- [facility]  12/27/2016, 3:25 PM

## 2016-12-28 LAB — BASIC METABOLIC PANEL
Anion Gap: 15 mmol/L (ref 9–17)
BUN: 12 mg/dL (ref 6–20)
CO2: 26 mmol/L (ref 20–31)
Calcium: 8.9 mg/dL (ref 8.6–10.4)
Chloride: 103 mmol/L (ref 98–107)
Creatinine: 0.4 mg/dL — ABNORMAL LOW (ref 0.50–0.90)
GFR African American: >60 mL/min (ref >60)
GFR Non-African American: >60 mL/min (ref >60)
Glucose: 119 mg/dL — ABNORMAL HIGH (ref 70–99)
Potassium: 3.9 mmol/L (ref 3.7–5.3)
Sodium: 144 mmol/L (ref 135–144)

## 2016-12-28 MED ORDER — MELATONIN 1 MG PO TABS
1 MG | Freq: Once | ORAL | Status: AC
Start: 2016-12-28 — End: 2016-12-28
  Administered 2016-12-28: 04:00:00 3 mg via ORAL

## 2016-12-28 MED ORDER — DESVENLAFAXINE SUCCINATE ER 50 MG PO TB24
50 MG | Freq: Every day | ORAL | Status: DC
Start: 2016-12-28 — End: 2016-12-31
  Administered 2016-12-28 – 2016-12-31 (×4): 100 mg via ORAL

## 2016-12-28 MED ORDER — ACETAMINOPHEN 325 MG PO TABS
325 MG | Freq: Once | ORAL | Status: AC
Start: 2016-12-28 — End: 2016-12-27
  Administered 2016-12-28: 02:00:00 650 mg via ORAL

## 2016-12-28 MED ORDER — QUETIAPINE FUMARATE 100 MG PO TABS
100 MG | Freq: Every evening | ORAL | Status: DC
Start: 2016-12-28 — End: 2016-12-31
  Administered 2016-12-29 – 2016-12-31 (×3): 100 mg via ORAL

## 2016-12-28 MED ORDER — ACETAMINOPHEN 325 MG PO TABS
325 MG | ORAL | Status: DC | PRN
Start: 2016-12-28 — End: 2016-12-31
  Administered 2016-12-29 – 2016-12-31 (×5): 650 mg via ORAL

## 2016-12-28 MED FILL — MELATONIN 1 MG PO TABS: 1 MG | ORAL | Qty: 3

## 2016-12-28 MED FILL — SEROQUEL 100 MG PO TABS: 100 MG | ORAL | Qty: 1

## 2016-12-28 MED FILL — DESVENLAFAXINE SUCCINATE ER 50 MG PO TB24: 50 MG | ORAL | Qty: 2

## 2016-12-28 MED FILL — IPRATROPIUM-ALBUTEROL 0.5-2.5 (3) MG/3ML IN SOLN: RESPIRATORY_TRACT | Qty: 3

## 2016-12-28 MED FILL — FLUZONE QUADRIVALENT 0.5 ML IM SUSY: 0.5 ML | INTRAMUSCULAR | Qty: 0.5

## 2016-12-28 MED FILL — METFORMIN HCL 500 MG PO TABS: 500 MG | ORAL | Qty: 1

## 2016-12-28 MED FILL — ALBUTEROL SULFATE (2.5 MG/3ML) 0.083% IN NEBU: RESPIRATORY_TRACT | Qty: 3

## 2016-12-28 MED FILL — ROPINIROLE HCL 1 MG PO TABS: 1 MG | ORAL | Qty: 3

## 2016-12-28 MED FILL — PREDNISONE 20 MG PO TABS: 20 MG | ORAL | Qty: 2

## 2016-12-28 MED FILL — FOLIC ACID 1 MG PO TABS: 1 MG | ORAL | Qty: 1

## 2016-12-28 MED FILL — PANTOPRAZOLE SODIUM 40 MG PO TBEC: 40 MG | ORAL | Qty: 1

## 2016-12-28 MED FILL — LORAZEPAM 0.5 MG PO TABS: 0.5 MG | ORAL | Qty: 2

## 2016-12-28 MED FILL — LEVOFLOXACIN 500 MG PO TABS: 500 MG | ORAL | Qty: 1

## 2016-12-28 MED FILL — MONTELUKAST SODIUM 10 MG PO TABS: 10 MG | ORAL | Qty: 1

## 2016-12-28 MED FILL — LISINOPRIL 10 MG PO TABS: 10 MG | ORAL | Qty: 1

## 2016-12-28 MED FILL — NICOTINE 21 MG/24HR TD PT24: 21 MG/24HR | TRANSDERMAL | Qty: 1

## 2016-12-28 MED FILL — SENNOSIDES-DOCUSATE SODIUM 8.6-50 MG PO TABS: ORAL | Qty: 1

## 2016-12-28 MED FILL — LIPITOR 20 MG PO TABS: 20 MG | ORAL | Qty: 1

## 2016-12-28 MED FILL — ENOXAPARIN SODIUM 40 MG/0.4ML SC SOLN: 40 MG/0.4ML | SUBCUTANEOUS | Qty: 0.4

## 2016-12-28 MED FILL — MAPAP 325 MG PO TABS: 325 MG | ORAL | Qty: 2

## 2016-12-28 NOTE — Plan of Care (Signed)
Problem: RESPIRATORY  Intervention: Respiratory assessment  BRONCHOSPASM/BRONCHOCONSTRICTION     [x]          IMPROVE AERATION/BREATH SOUNDS  [x]    ADMINISTER BRONCHODILATOR THERAPY AS APPROPRIATE  [x]    ASSESS BREATH SOUNDS  [x]    IMPLEMENT AEROSOL/MDI PROTOCOL  [x]    PATIENT EDUCATION AS NEEDED          Comments:   Shannon Allison M Lashell Shannon Allison, RCPPatient Assessment complete. Asthma exacerbation, mild [J45.901] .   Vitals:    12/28/16 0741   BP:    Pulse:    Resp: 20   Temp:    SpO2:    . Patients home meds are   Prior to Admission medications    Medication Sig Start Date End Date Taking? Authorizing Provider   QUEtiapine (SEROQUEL) 100 MG tablet Take 100 mg by mouth nightly   Yes Historical Provider, MD   rOPINIRole (REQUIP) 2 MG tablet TAKE 1 TABLET BY MOUTH 3 TIMES DAILY 10/19/16   Foy Guadalajara, APRN - CNP   rOPINIRole (REQUIP) 1 MG tablet Take 1 tablet by mouth 3 times daily In addition to a 2 mg tablet for a total of 3 mg TID 09/28/16   Foy Guadalajara, APRN - CNP   desvenlafaxine succinate (PRISTIQ) 100 MG TB24 extended release tablet Take by mouth daily    Historical Provider, MD   folic acid (FOLVITE) 1 MG tablet Take 1 mg by mouth daily    Historical Provider, MD   famotidine (PEPCID) 20 MG tablet Take 20 mg by mouth 2 times daily as needed    Historical Provider, MD   polyethylene glycol (GLYCOLAX) packet Take 17 g by mouth daily as needed for Constipation    Historical Provider, MD   paliperidone palmitate (INVEGA SUSTENNA) 234 MG/1.5ML SUSP IM injection Inject 234 mg into the muscle every 30 days    Historical Provider, MD   fluticasone (FLONASE) 50 MCG/ACT nasal spray 1 spray by Nasal route daily    Historical Provider, MD   montelukast (SINGULAIR) 10 MG tablet Take 10 mg by mouth nightly    Historical Provider, MD   esomeprazole Magnesium (NEXIUM) 40 MG PACK Take 40 mg by mouth daily    Historical Provider, MD   senna-docusate (PERICOLACE) 8.6-50 MG per tablet Take 1 tablet by mouth daily    Historical  Provider, MD   atorvastatin (LIPITOR) 20 MG tablet Take 20 mg by mouth daily    Historical Provider, MD   LORazepam (ATIVAN) 1 MG tablet Take 1 mg by mouth every 8 hours as needed for Anxiety    Historical Provider, MD   metFORMIN (GLUCOPHAGE) 500 MG tablet Take 500 mg by mouth 2 times daily (with meals)    Historical Provider, MD   hydrOXYzine (VISTARIL) 25 MG capsule Take 25 mg by mouth 3 times daily as needed for Itching    Historical Provider, MD   lisinopril (PRINIVIL;ZESTRIL) 5 MG tablet Take 5 mg by mouth daily    Historical Provider, MD     RR 22  Breath Sounds: exp wheezes       Bronchodilator assessment at level  2   Hyperinflation assessment at level    Secretion Management assessment at level        []     Bronchodilator Assessment  BRONCHODILATOR ASSESSMENT SCORE  Score 0 1 2 3 4 5    Breath Sounds   []   Patient Baseline []   No Wheeze good aeration []   Faint, scattered wheezing, good aeration [  x]  Expiratory Wheezing and or moderately diminished   Insp/Exp wheeze and/or very diminished   Insp/Exp and/ or marked distress   Respiratory Rate     Patient Baseline   Less than 20   Less than 20   20-25   Greater than 25   Greater than 25   Peak flow % of Pred or PB   NA     Greater than 90%    81-90%   71-80%   Less than or equal to 70%  or unable to perform   Unable due to Respiratory Distress   Dyspnea re   Patient Baseline   No SOB   No SOB   SOB on exertion   SOB min activity   At rest/acute   e FEV% Predicted         NA   Above 69%    Unable   Above 60-69%    Unable   Above 50-59%    Unable   Above 35-49%    Unable   Less than 35%    Unable                   Hyperinflation Assessment  Score CXR and Breath Sounds     Clear   No atelectasis  Basilar aeration   Atelectasis or absent basilar breath sounds   Incentive Spirometry Volume  (Per IBW)     Greater than or equal to 56ml/Kg   less than 66ml/Kg   less than  43ml/Kg   Surgery within last 2 weeks   None or general     Abdominal or thoracic surgery    Abdominal or thoracic   Chronic Pulmonary Historyre   No   Yes   Yes       Secretion Management Assessment  Score Bilateral Breath Sounds     Occasional Rhonchi   Scattered Rhonchi   Course Rhonchi and/or poor aeration   Sputum      Small amount of thin secretions   Moderate amount of viscous secretions   Copius, Viscious Yellow/ Secretions   CXR as reported by physician   clear    Unavailable   Infiltrates and/or consolidation    Unavailable   Mucus Plugging and or lobar consolidation    Unavailable   Cough   Strong, productive cough   Weak productive cough   No cough or weak non-productive cough   Shannon Allison M Shannon Allison  7:50 AM                            FEMALE                                  FEMALE                            FEV1 Predicted Normal Values                        FEV1 Predicted Normal Values          Age                                     Height in  Feet and Inches       Age                                     Height in Feet and Inches       4\' 11"  5\' 1"  5\' 3"  5\' 5"  5\' 7"  5\' 9"  5\' 11"  6\' 1"   4\' 11"  5\' 1"  5\' 3"  5\' 5"  5\' 7"  5\' 9"  5\' 11"  6\' 1"    42 - 45 2.49 2.66 2.84 3.03 3.22 3.42 3.62 3.83 42 - 45 2.82 3.03 3.26 3.49 3.72 3.96 4.22 4.47   46 - 49 2.40 2.57 2.76 2.94 3.14 3.33 3.54 3.75 46 - 49 2.70 2.92 3.14 3.37 3.61 3.85 4.10 4.36   50 - 53 2.31 2.48 2.66 2.85 3.04 3.24 3.45 3.66 50 - 53 2.58 2.80 3.02 3.25 3.49 3.73 3.98 4.24   54 - 57 2.21 2.38 2.57 2.75 2.95 3.14 3.35 3.56 54 - 57 2.46 2.67 2.89 3.12 3.36 3.60 3.85 4.11   58 - 61 2.10 2.28 2.46 2.65 2.84 3.04 3.24 3.45 58 - 61 2.32 2.54 2.76 2.99 3.23 3.47 3.72 3.98   62 - 65 1.99 2.17 2.35 2.54 2.73 2.93 3.13 3.34 62 - 65 2.19 2.40 2.62 2.85 3.09 3.33 3.58 3.84   66 - 69 1.88 2.05 2.23 2.42 2.61 2.81 3.02 3.23 66 - 69 2.04 2.26 2.48 2.71 2.95 3.19 3.44 3.70   70+ 1.82 1.99 2.17 2.36 2.55 2.75 2.95 3.16 70+  1.97 2.19 2.41 2.64 2.87 3.12 3.37 3.62             Predicted Peak Expiratory Flow Rate                                       Height (in)  Female       Height (in) Female           Age 58 25 62 62 1 66 42 70 Age            20 344 357 372 387 402 417 432 446  60 62 64 66 68 70 72 74 76   25 337 352 366 381 396 411  426 441 25 447 476 505 533 562 [phone]   30 329 344 359 374 389 404 419 434 30 437 466 494  523 [phone] 667   35 322 337 351 366 381 396 411 426 35 426 455 484 512 541 [phone]    40 314 329 344 359 374 389 404 419 40 416 445 473 502 531 559 588 617 647   45 307 322 336 351 366  381 396 411 45 405 434 463 491 520 549 577 606 636   50 299 314 329 344 359 374 389 404 50 395 424  452 481 510 538 567 596 625   55 292 307 321 336 351 366 381 396 55 384 413 442 470 499 528 556 585  615   60 284 299 314 329 344 359 374 389 60 374 403 431 460 489 517 546 575 605   65 277 292 306 321  336 351 366 381 65 363 392 421 449 478 507 535 564 594   70 269 284 299 314 329 344 359 374 70 353  382  410 439 468 496 525 554 583   75 261 274 289 305 319 334 348 364 75 344 372 400 429 458 487 515 544 573   80 253 266 282 296 312 327 342 356 80 335 362 390 419 448 476 [phone]

## 2016-12-28 NOTE — Progress Notes (Signed)
BRONCHOSPASM/BRONCHOCONSTRICTION     [x]         IMPROVE AERATION/BREATH SOUNDS  [x]   ADMINISTER BRONCHODILATOR THERAPY AS APPROPRIATE  [x]   ASSESS BREATH SOUNDS  [x]   IMPLEMENT AEROSOL/MDI PROTOCOL  [x]   PATIENT EDUCATION AS NEEDED

## 2016-12-28 NOTE — Progress Notes (Signed)
Occupational Therapy  [facility]  Daily Treatment Note  NAME: Shannon Allison  DOB: 06/27/1960  MRN: [id number]    Date of Service: 12/28/2016    Discharge Recommendations:  Home with assist PRN       Patient Diagnosis(es): The encounter diagnosis was Intermittent asthma with acute exacerbation, unspecified asthma severity.      has a past medical history of Asthma and Diabetes mellitus (HCC).   has a past surgical history that includes Nasal septum surgery (Bilateral); pr esophagogastroduodenoscopy transoral diagnostic (N/A, 01/04/2016); pr colon ca scrn not hi rsk ind (01/04/2016); and pr colon ca scrn not hi rsk ind (N/A, 02/10/2016).    Restrictions  Restrictions/Precautions  Restrictions/Precautions: General Precautions, Fall Risk, Contact Precautions  Required Braces or Orthoses?: No  Subjective   General  Patient assessed for rehabilitation services?: Yes  Family / Caregiver Present: No  Diagnosis: Asthma exacerbation  Pain Assessment  Patient Currently in Pain: No  Vital Signs  Patient Currently in Pain: No   Orientation  Orientation  Overall Orientation Status: Within Functional Limits  Objective    ADL  Grooming: Stand by assistance;Increased time to complete  UE Bathing: Modified independent ;Increased time to complete  LE Bathing: Supervision;Increased time to complete  UE Dressing: Setup;Supervision;Increased time to complete  LE Dressing: Setup;Supervision;Increased time to complete  Toileting: Supervision;Increased time to complete  Additional Comments: pt used RW for support/safety to complete functional transfers   Balance  Sitting Balance: Modified independent   Standing Balance: Stand by assistance (w/RW)  Standing Balance  Sit to stand: Stand by assistance  Stand to sit: Stand by assistance  Comment: pt was displaying B UE tremors secondary to parkinson disease  Toilet Transfers  Toilet - Technique: Ambulating  Equipment Used: Raised toilet seat with rails  Toilet Transfer:  Supervision  Bed mobility  Rolling to Left: Modified independent  Rolling to Right: Modified independent  Supine to Sit: Modified independent  Sit to Supine: Modified independent  Scooting: Modified independent  Transfers  Stand Step Transfers: Stand by assistance  Sit to stand: Stand by assistance  Stand to sit: Stand by assistance  Attendance  Participation: Active participation   Assessment   Performance deficits / Impairments: Decreased functional mobility ;Decreased endurance;Decreased ADL status;Decreased high-level IADLs;Decreased balance;Decreased safe awareness  Prognosis: Good  Patient Education: Ed on OT services/POC, transfer safety, EC/WS, ADLs, - good return  REQUIRES OT FOLLOW UP: Yes  Activity Tolerance  Activity Tolerance: Patient Tolerated treatment well     Plan   Plan  Times per week: 3x  Current Treatment Recommendations: Location managerBalance Training, Building services engineerunctional Mobility Training, Safety Education & Training, Self-Care / ADL, Home Management Training, Equities traderatient/Caregiver Education & Training, Mining engineerquipment Evaluation, Education, Public relations account executive& procurement  Goals  Short term goals  Time Frame for Short term goals: Pt will by discharge   Short term goal 1: demo supine<>sit transfer (I)  Short term goal 2: demo good safety awareness during func mob around room with no AD and (I)  Short term goal 3: demo ADL UB/LB dressing/bathing activity standing with seated rest breaks PRN and mod I  Short term goal 4: demo bending/reaching func activity while standing with mod I to simulate home       Therapy Time   Individual Concurrent Group Co-treatment   Time In  1345         Time Out  1440         Minutes  Adele Barthel Laffey, COTA/L

## 2016-12-28 NOTE — Progress Notes (Signed)
[facility]     Department of Internal Medicine - Staff Internal Medicine Service     DAILY PROGRESS NOTE       Patient:  Shannon Allison  Date of Birth: 04-11-1960  MRN: [id number]     Acct: [id number]     Admit date: 12/26/2016  Admitting Diagnosis: Asthma exacerbation, mild    Subjective:   Patient seen and examined at bedside. No acute events overnight.Afebrile. Patient on O2 via nasal canula. Desaturates once O2 is removed. Needs observation.    Review of Systems   Constitutional: Negative for chills, fever, malaise/fatigue and weight loss.   Respiratory: Positive for shortness of breath and wheezing. Negative for cough, hemoptysis and sputum production.    Cardiovascular: Negative for chest pain, palpitations, orthopnea and claudication.   Gastrointestinal: Negative for abdominal pain, heartburn, nausea and vomiting.   Genitourinary: Negative for dysuria, frequency, hematuria and urgency.   Musculoskeletal: Negative for back pain, joint pain, myalgias and neck pain.   Skin: Negative for itching and rash.     Objective:   BP (!) 128/94   Pulse 97   Temp 97.4 F (36.3 C) (Oral)   Resp 22   Ht 5\' 9"  (1.753 m)   Wt 213 lb 13.5 oz (97 kg)   SpO2 90%   BMI 31.58 kg/m       General appearance - alert, well appearing, and in no distress  Mental status - alert, oriented to person, place, and time  Chest - bilateral wheezes in the lungs  Heart - normal rate, regular rhythm, normal S1, S2, no murmurs, rubs, clicks or gallops  Abdomen - soft, nontender, nondistended, no masses or organomegaly  Neurological - alert, oriented, normal speech, no focal findings or movement disorder noted  Musculoskeletal - no joint tenderness, deformity or swelling  Extremities - peripheral pulses normal, no pedal edema, no clubbing or cyanosis  Skin - normal coloration and turgor, no rashes, no suspicious skin lesions noted      Intake/Output Summary (Last 24 hours) at 12/28/16 1028  Last data filed at  12/27/16 1755   Gross per 24 hour   Intake              760 ml   Output                0 ml   Net              760 ml     Medications:     . QUEtiapine  100 mg Oral Nightly   . ipratropium-albuterol  1 ampule Inhalation 4x daily   . influenza virus vaccine  0.5 mL Intramuscular Once   . levofloxacin  500 mg Oral Daily   . nicotine  1 patch Transdermal Daily   . desvenlafaxine succinate  100 mg Oral Daily   . atorvastatin  20 mg Oral Nightly   . fluticasone  1 spray Nasal Daily   . folic acid  1 mg Oral Daily   . lisinopril  5 mg Oral Daily   . metFORMIN  500 mg Oral BID WC   . montelukast  10 mg Oral Nightly   . rOPINIRole  3 mg Oral TID   . sodium chloride flush  10 mL Intravenous 2 times per day   . enoxaparin  40 mg Subcutaneous Daily   . predniSONE  40 mg Oral Daily   . sennosides-docusate sodium  1 tablet Oral Daily   .  pantoprazole  40 mg Oral QAM AC       Diagnostic Labs and Imaging    CBC: Recent Labs      12/26/16   1409   WBC  7.7   RBC  4.54   HGB  13.1   HCT  40.9   MCV  90.1   RDW  12.9   PLT  245     BMP: Recent Labs      12/26/16   1341  12/26/16   1409  12/28/16   0851   NA   --   140  144   K   --   4.2  3.9   CL   --   100  103   CO2   --   28  26   BUN   --   12  12   CREATININE  0.55  0.48*  0.40*     FASTING LIPID PANEL:  Lab Results   Component Value Date    CHOL 143 08/28/2016    HDL 51 08/28/2016    TRIG 130 08/28/2016     Assessment and Plan:   Principal Problem:    Asthma exacerbation, mild  Active Problems:    Essential hypertension    Dyslipidemia    Parkinson's disease (HCC)    Type 2 diabetes mellitus, without long-term current use of insulin (HCC)    Gastroesophageal reflux disease without esophagitis  Resolved Problems:    * No resolved hospital problems. *      1. 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 Essential HTN              - On Lisinopril     3. DM Type 2              - On Metformin 500 mg BD    4. Hyperlipidemia              - On Atorvastatin 20 mg    5. Parkinsonism              - On Requip 3 mg              - On Seroquel 100 mg              - On Prestiq 100 mg    6. DVT Prophylaxis              - On Lovenox 40 mg SQ    7. Diet              - General Diet      Disposition - Return to group home.  Transition to be determined    Laray Anger, MD  PGY-1, Department of Internal Medicine  [facility], [place], [place]  12/28/2016 10:28 AM    Attending Physician Statement    Active Hospital Problems    Diagnosis Date Noted   . Asthma exacerbation, mild [J45.901] 12/26/2016   . Type 2 diabetes mellitus, without long-term current use of insulin (HCC) [E11.9] 12/26/2016   . Gastroesophageal reflux disease without esophagitis [K21.9] 12/26/2016   . Parkinson's disease (HCC) [G20] 04/24/2016   . Essential hypertension [I10] 02/28/2016   . Dyslipidemia [E78.5] 02/28/2016       I have discussed the case, including pertinent history and exam findings with the resident and the team.  I have seen and examined the patient and the key elements of the encounter have been performed by me.  I agree with the assessment, plan and orders as documented by the resident.    Continue RT  Cindee Lame, MD, [facility] Health Physician - [facility] & [facility]  Associate Program Director Department of Internal Medicine  Internal Medicine Clerkship Site Director- [facility]  12/28/2016, 11:49 AM

## 2016-12-28 NOTE — Plan of Care (Signed)
Problem: Gas Exchange - Impaired:  Goal: Levels of oxygenation will improve  Levels of oxygenation will improve   Outcome: Met This Shift  Pursed lip breathing when needed. Activity as tolerated. Encourage to cough and deep breathe. Maintain sufficient oxygen saturation. Encourage to express needs. Continue to monitor.

## 2016-12-28 NOTE — Care Coordination-Inpatient (Signed)
Transition planning:   PT/OT recommend return to group home. Will need transportation and recommend OP therapy. [facility]

## 2016-12-28 NOTE — Progress Notes (Signed)
Physical Therapy  Facility/Department: [facility]  Daily Treatment Note  NAME: Shannon Allison  DOB: 1961-02-08  MRN: [id number]    Date of Service: 12/28/2016    Discharge Recommendations:  24 hour supervision or assist, Outpatient PT (return to group home)   PT Equipment Recommendations  Equipment Needed: Yes  Mobility Devices: Lyda Perone: Rolling    Patient Diagnosis(es): The encounter diagnosis was Intermittent asthma with acute exacerbation, unspecified asthma severity.     has a past medical history of Asthma and Diabetes mellitus (HCC).   has a past surgical history that includes Nasal septum surgery (Bilateral); pr esophagogastroduodenoscopy transoral diagnostic (N/A, 01/04/2016); pr colon ca scrn not hi rsk ind (01/04/2016); and pr colon ca scrn not hi rsk ind (N/A, 02/10/2016).    Restrictions  Restrictions/Precautions  Restrictions/Precautions: General Precautions, Fall Risk, Contact Precautions  Required Braces or Orthoses?: No  Subjective   General  Chart Reviewed: Yes  Response To Previous Treatment: Patient with no complaints from previous session.  Family / Caregiver Present: No  Subjective  Subjective: Pt and RN agreeable to PT. Pt supine in bed upon arrival, reports she has just finished with OT. Pleasant and cooperative. Pt reports having Parkinson's, noted tremors of B UE/LE's t/o treatment  General Comment  Comments: Pt left in bed with call light within reach, bed alarm activated  Pain Screening  Patient Currently in Pain: Denies  Vital Signs  Patient Currently in Pain: Denies       Orientation  Orientation  Overall Orientation Status: Within Normal Limits  Objective   Bed mobility  Rolling to Left: Modified independent  Rolling to Right: Modified independent  Supine to Sit: Modified independent  Sit to Supine: Modified independent  Scooting: Modified independent  Comment: HOB elevated, use of bedrails  Transfers  Sit to Stand: Contact guard assistance  Stand to sit: Contact guard  assistance  Comment: vc's for UE placement with good return demo  Ambulation  Ambulation?: Yes  Ambulation 1  Surface: level tile  Device: Rolling Walker  Other Apparatus:  (amb on RA)  Assistance: Minimal assistance  Quality of Gait: Mina required to correct RW to the center as pt tends to drift toward the L. RLE demo's ER, pt reports as baseline  Distance: 170ft  Comments: pt educated on the importance of using RW for balance  Stairs/Curb  Stairs?: No     Balance  Posture: Good  Sitting - Static: Good  Sitting - Dynamic: Good  Standing - Static: Good;-  Standing - Dynamic: Fair;+  Comments: standing balance assessed with RW  Exercises  Seated LE exercise program: Long Arc Quads, hip abduction/adduction, heel/toe raises, and marches. Reps: 10x   Upper extremity exercises: Bicep curl, shoulder flexion/extension, punches, tricep curl, shoulder abduction/adduction. Reps: 10x   Comments:  Good effort with PT        Assessment   Body structures, Functions, Activity limitations: Decreased functional mobility ;Decreased ROM;Decreased strength;Decreased endurance  Assessment: Pt able to ambulate 164ft with RW and MinA, pt safer while using RW and is therefore recommended for home use. Pt will beneit from continued acute PT and Outpatient PT upon discharge.   Prognosis: Good  Patient Education: pt educated to use RW  REQUIRES PT FOLLOW UP: Yes  Activity Tolerance  Activity Tolerance: Patient Tolerated treatment well;Patient limited by endurance     Goals  Short term goals  Time Frame for Short term goals: 14 visits  Short term goal 1: Pt to  ambulate 342ft with RW and CGA  Short term goal 2: Pt to tolerate at elast of UE/LE exercises in order to increase overall strength  Short term goal 3: Pt to perform bed mobility and transfers independently    Plan    Plan  Times per week: 5-6x/wk  Current Treatment Recommendations: Strengthening, ROM, Balance Training, Building services engineer, Teacher, early years/pre, Firefighter, Endurance Training  Safety Devices  Type of devices: Call light within reach, Gait belt, Left in bed, All fall risk precautions in place, Bed alarm in place, Nurse notified     Therapy Time   Individual Concurrent Group Co-treatment   Time In 1445         Time Out 1516         Minutes [address] [place]

## 2016-12-29 MED FILL — LIPITOR 20 MG PO TABS: 20 MG | ORAL | Qty: 1

## 2016-12-29 MED FILL — PANTOPRAZOLE SODIUM 40 MG PO TBEC: 40 MG | ORAL | Qty: 1

## 2016-12-29 MED FILL — PREDNISONE 20 MG PO TABS: 20 MG | ORAL | Qty: 2

## 2016-12-29 MED FILL — ROPINIROLE HCL 1 MG PO TABS: 1 MG | ORAL | Qty: 3

## 2016-12-29 MED FILL — NICOTINE 21 MG/24HR TD PT24: 21 MG/24HR | TRANSDERMAL | Qty: 1

## 2016-12-29 MED FILL — LORAZEPAM 0.5 MG PO TABS: 0.5 MG | ORAL | Qty: 2

## 2016-12-29 MED FILL — MAPAP 325 MG PO TABS: 325 MG | ORAL | Qty: 2

## 2016-12-29 MED FILL — IPRATROPIUM-ALBUTEROL 0.5-2.5 (3) MG/3ML IN SOLN: RESPIRATORY_TRACT | Qty: 3

## 2016-12-29 MED FILL — SENNOSIDES-DOCUSATE SODIUM 8.6-50 MG PO TABS: ORAL | Qty: 1

## 2016-12-29 MED FILL — PRISTIQ 50 MG PO TB24: 50 MG | ORAL | Qty: 2

## 2016-12-29 MED FILL — HYDROXYZINE HCL 25 MG PO TABS: 25 MG | ORAL | Qty: 1

## 2016-12-29 MED FILL — LEVOFLOXACIN 500 MG PO TABS: 500 MG | ORAL | Qty: 1

## 2016-12-29 MED FILL — FOLIC ACID 1 MG PO TABS: 1 MG | ORAL | Qty: 1

## 2016-12-29 MED FILL — ENOXAPARIN SODIUM 40 MG/0.4ML SC SOLN: 40 MG/0.4ML | SUBCUTANEOUS | Qty: 0.4

## 2016-12-29 MED FILL — METFORMIN HCL 500 MG PO TABS: 500 MG | ORAL | Qty: 1

## 2016-12-29 MED FILL — LISINOPRIL 10 MG PO TABS: 10 MG | ORAL | Qty: 1

## 2016-12-29 MED FILL — SEROQUEL 100 MG PO TABS: 100 MG | ORAL | Qty: 1

## 2016-12-29 MED FILL — MONTELUKAST SODIUM 10 MG PO TABS: 10 MG | ORAL | Qty: 1

## 2016-12-29 NOTE — Plan of Care (Signed)
Problem: RESPIRATORY  Intervention: Respiratory assessment  BRONCHOSPASM/BRONCHOCONSTRICTION     [x]         IMPROVE AERATION/BREATH SOUNDS  [x]   ADMINISTER BRONCHODILATOR THERAPY AS APPROPRIATE  [x]   ASSESS BREATH SOUNDS  [x]   IMPLEMENT AEROSOL/MDI PROTOCOL  [x]   PATIENT EDUCATION AS NEEDED

## 2016-12-29 NOTE — Progress Notes (Signed)
Shannon Allison A Filiberto Wamble, RCPPatient Assessment complete. Asthma exacerbation, mild [J45.901] .   Vitals:    12/29/16 0741   BP: 128/78   Pulse: 79   Resp: 21   Temp: 96.7 F (35.9 C)   SpO2: 92%   . Patients home meds are   Prior to Admission medications    Medication Sig Start Date End Date Taking? Authorizing Provider   QUEtiapine (SEROQUEL) 100 MG tablet Take 100 mg by mouth nightly   Yes Historical Provider, MD   rOPINIRole (REQUIP) 2 MG tablet TAKE 1 TABLET BY MOUTH 3 TIMES DAILY 10/19/16   Foy Guadalajaraheryl A McGarry, APRN - CNP   rOPINIRole (REQUIP) 1 MG tablet Take 1 tablet by mouth 3 times daily In addition to a 2 mg tablet for a total of 3 mg TID 09/28/16   Foy Guadalajaraheryl A McGarry, APRN - CNP   desvenlafaxine succinate (PRISTIQ) 100 MG TB24 extended release tablet Take by mouth daily    Historical Provider, MD   folic acid (FOLVITE) 1 MG tablet Take 1 mg by mouth daily    Historical Provider, MD   famotidine (PEPCID) 20 MG tablet Take 20 mg by mouth 2 times daily as needed    Historical Provider, MD   polyethylene glycol (GLYCOLAX) packet Take 17 g by mouth daily as needed for Constipation    Historical Provider, MD   paliperidone palmitate (INVEGA SUSTENNA) 234 MG/1.5ML SUSP IM injection Inject 234 mg into the muscle every 30 days    Historical Provider, MD   fluticasone (FLONASE) 50 MCG/ACT nasal spray 1 spray by Nasal route daily    Historical Provider, MD   montelukast (SINGULAIR) 10 MG tablet Take 10 mg by mouth nightly    Historical Provider, MD   esomeprazole Magnesium (NEXIUM) 40 MG PACK Take 40 mg by mouth daily    Historical Provider, MD   senna-docusate (PERICOLACE) 8.6-50 MG per tablet Take 1 tablet by mouth daily    Historical Provider, MD   atorvastatin (LIPITOR) 20 MG tablet Take 20 mg by mouth daily    Historical Provider, MD   LORazepam (ATIVAN) 1 MG tablet Take 1 mg by mouth every 8 hours as needed for Anxiety    Historical Provider, MD   metFORMIN (GLUCOPHAGE) 500 MG tablet Take 500 mg by mouth 2 times daily (with  meals)    Historical Provider, MD   hydrOXYzine (VISTARIL) 25 MG capsule Take 25 mg by mouth 3 times daily as needed for Itching    Historical Provider, MD   lisinopril (PRINIVIL;ZESTRIL) 5 MG tablet Take 5 mg by mouth daily    Historical Provider, MD   .  Recent Surgical History: None = 0     Assessment     Peak Flow (asthma only)    Predicted: 381  Personal Best:   PEF 220  % Predicted       RR 22  Breath Sounds: diminished        Bronchodilator assessment at level  3   Hyperinflation assessment at level    Secretion Management assessment at level        [x]     Bronchodilator Assessment  BRONCHODILATOR ASSESSMENT SCORE  Score 0 1 2 3 4 5    Breath Sounds   []   Patient Baseline []   No Wheeze good aeration []   Faint, scattered wheezing, good aeration [x]   Expiratory Wheezing and or moderately diminished []   Insp/Exp wheeze and/or very diminished []   Insp/Exp and/ or marked distress  Respiratory Rate     Patient Baseline   Less than 20   Less than 20   20-25   Greater than 25   Greater than 25   Peak flow % of Pred or PB   NA     Greater than 90%    81-90%   71-80%   Less than or equal to 70%  or unable to perform   Unable due to Respiratory Distress   Dyspnea re   Patient Baseline   No SOB   No SOB   SOB on exertion   SOB min activity   At rest/acute   e FEV% Predicted         NA   Above 69%    Unable   Above 60-69%    Unable   Above 50-59%    Unable   Above 35-49%    Unable   Less than 35%    Unable                   Hyperinflation Assessment  Score CXR and Breath Sounds     Clear   No atelectasis  Basilar aeration   Atelectasis or absent basilar breath sounds   Incentive Spirometry Volume  (Per IBW)     Greater than or equal to 35ml/Kg   less than 93ml/Kg   less than 30ml/Kg   Surgery within last 2 weeks   None or general     Abdominal or thoracic surgery    Abdominal or thoracic   Chronic Pulmonary Historyre   No    Yes   Yes       Secretion Management Assessment  Score Bilateral Breath Sounds     Occasional Rhonchi   Scattered Rhonchi   Course Rhonchi and/or poor aeration   Sputum      Small amount of thin secretions   Moderate amount of viscous secretions   Copius, Viscious Yellow/ Secretions   CXR as reported by physician   clear    Unavailable   Infiltrates and/or consolidation    Unavailable   Mucus Plugging and or lobar consolidation    Unavailable   Cough   Strong, productive cough   Weak productive cough   No cough or weak non-productive cough   Kessler Kopinski A Victorhugo Preis  8:18 AM                            FEMALE                                  FEMALE                            FEV1 Predicted Normal Values                        FEV1 Predicted Normal Values          Age                                     Height in Feet and Inches       Age  Height in Feet and Inches       '4\' 11"'$  '5\' 1"'$  '5\' 3"'$  '5\' 5"'$  '5\' 7"'$  '5\' 9"'$  '5\' 11"'$  '6\' 1"'$   '4\' 11"'$  '5\' 1"'$  '5\' 3"'$  '5\' 5"'$  '5\' 7"'$  '5\' 9"'$  '5\' 11"'$  '6\' 1"'$    42 - 45 2.49 2.66 2.84 3.03 3.22 3.42 3.62 3.83 42 - 45 2.82 3.03 3.26 3.49 3.72 3.96 4.22 4.47   46 - 49 2.40 2.57 2.76 2.94 3.14 3.33 3.54 3.75 46 - 49 2.70 2.92 3.14 3.37 3.61 3.85 4.10 4.36   50 - 53 2.31 2.48 2.66 2.85 3.04 3.24 3.45 3.66 50 - 53 2.58 2.80 3.02 3.25 3.49 3.73 3.98 4.24   54 - 57 2.21 2.38 2.57 2.75 2.95 3.14 3.35 3.56 54 - 57 2.46 2.67 2.89 3.12 3.36 3.60 3.85 4.11   58 - 61 2.10 2.28 2.46 2.65 2.84 3.04 3.24 3.45 58 - 61 2.32 2.54 2.76 2.99 3.23 3.47 3.72 3.98   62 - 65 1.99 2.17 2.35 2.54 2.73 2.93 3.13 3.34 62 - 65 2.19 2.40 2.62 2.85 3.09 3.33 3.58 3.84   66 - 69 1.88 2.05 2.23 2.42 2.61 2.81 3.02 3.23 66 - 69 2.04 2.26 2.48 2.71 2.95 3.19 3.44 3.70   70+ 1.82 1.99 2.17 2.36 2.55 2.75 2.95 3.16 70+ 1.97 2.19 2.41 2.64 2.87 3.12 3.37 3.62             Predicted Peak Expiratory Flow Rate                                       Height (in)  Female       Height  (in) Female           Age 70 36 44 62 88 66 68 70 Age            '20 344 357 372 387 402 417 432 446  60 62 64 66 68 70 72 74 76   25 337 352 366 381 396 411 ''426 441 25 447 476 505 533 [phone] 677   30 329 344 359 374 389 404 419 434 30 437 466 494 ''523 552 580 609 638 667   35 322 337 351 366 381 396 411 426 35 426 455 484 [phone] 657   ''40 314 329 344 359 374 389 404 419 40 416 445 473 502 531 559 588 617 647   45 307 322 336 351 366 ''381 396 411 45 405 434 463 491 520 549 577 606 636   50 299 314 329 344 359 374 389 404 50 395 424 ''452 481 510 538 567 596 625   55 292 307 321 336 351 366 381 396 55 384 413 442 470 499 528 556 585 ''615   60 284 299 314 329 344 359 374 389 60 374 403 431 460 489 517 546 575 605   65 277 292 306 321 ''336 351 366 381 65 363 392 421 449 478 507 535 564 594   70 269 284 299 314 329 344 359 374 70 353 ''382 410 439 468 496 525 554 583   75 261 274 289 305 319 334 348 364 75 344 372 400 429 458 487 515 '$ 544 573   '80 253 266 282 296 312 327 342 356 80 '$ 335 362 390 419 448 476  [phone]

## 2016-12-29 NOTE — Progress Notes (Signed)
Shannon Allison was evaluated today and a DME order was entered for a nebulizer compressor in order to administer Albuterol and Ipratropium due to the diagnosis of COPD.  The need for this equipment and treatment was discussed with the patient and she understands and is in agreement.      Laray Anger, MD  PGY-1, Department of Internal Medicine  [facility] [place], [place]  12/29/2016 9:08 AM

## 2016-12-29 NOTE — Plan of Care (Signed)
Problem: Gas Exchange - Impaired:  Goal: Levels of oxygenation will improve  Levels of oxygenation will improve   Outcome: Met This Shift  Pursed lip breathing when needed. Activity as tolerated. Encourage to cough and deep breathe. Maintain sufficient oxygen saturation. Encourage to express needs. Continue to monitor.

## 2016-12-29 NOTE — Progress Notes (Signed)
[facility]     Department of Internal Medicine - Staff Internal Medicine Service     DAILY PROGRESS NOTE       Patient:  Shannon Allison  Date of Birth: 07-Dec-1960  MRN: [id number]     Acct: [id number]     Admit date: 12/26/2016  Admitting Diagnosis: Asthma exacerbation, mild    Subjective:   Patient seen and examined at bedside. No acute events overnight. Patient is still a little SOB. She still needs a day of observation. Wheezes heard on auscultation. Patient using acapella.    Review of Systems   Constitutional: Negative for chills, fever, malaise/fatigue and weight loss.   Respiratory: Positive for shortness of breath and wheezing. Negative for cough, hemoptysis and sputum production.    Cardiovascular: Negative for chest pain, palpitations, orthopnea and claudication.   Gastrointestinal: Negative for abdominal pain, heartburn, nausea and vomiting.   Genitourinary: Negative for dysuria, frequency, hematuria and urgency.   Musculoskeletal: Negative for back pain, joint pain, myalgias and neck pain.   Skin: Negative for itching and rash.     Objective:   BP (!) 126/98   Pulse 97   Temp 97.7 F (36.5 C) (Oral)   Resp 20   Ht 5\' 9"  (1.753 m)   Wt 213 lb 13.5 oz (97 kg)   SpO2 92%   BMI 31.58 kg/m       General appearance - alert, well appearing, and in no distress  Mental status - alert, oriented to person, place, and time  Chest - bilateral wheezes in the lungs  Heart - normal rate, regular rhythm, normal S1, S2, no murmurs, rubs, clicks or gallops  Abdomen - soft, nontender, nondistended, no masses or organomegaly  Neurological - alert, oriented, normal speech, no focal findings or movement disorder noted  Musculoskeletal - no joint tenderness, deformity or swelling  Extremities - peripheral pulses normal, no pedal edema, no clubbing or cyanosis  Skin - normal coloration and turgor, no rashes, no suspicious skin lesions noted    No intake or output data in the 24 hours ending  12/29/16 0528      Medications:     . QUEtiapine  100 mg Oral Nightly   . ipratropium-albuterol  1 ampule Inhalation 4x daily   . levofloxacin  500 mg Oral Daily   . nicotine  1 patch Transdermal Daily   . desvenlafaxine succinate  100 mg Oral Daily   . atorvastatin  20 mg Oral Nightly   . fluticasone  1 spray Nasal Daily   . folic acid  1 mg Oral Daily   . lisinopril  5 mg Oral Daily   . metFORMIN  500 mg Oral BID WC   . montelukast  10 mg Oral Nightly   . rOPINIRole  3 mg Oral TID   . sodium chloride flush  10 mL Intravenous 2 times per day   . enoxaparin  40 mg Subcutaneous Daily   . predniSONE  40 mg Oral Daily   . sennosides-docusate sodium  1 tablet Oral Daily   . pantoprazole  40 mg Oral QAM AC       Diagnostic Labs and Imaging    CBC:   Recent Labs      12/26/16   1409   WBC  7.7   RBC  4.54   HGB  13.1   HCT  40.9   MCV  90.1   RDW  12.9   PLT  245  BMP:   Recent Labs      12/26/16   1341  12/26/16   1409  12/28/16   0851   NA   --   140  144   K   --   4.2  3.9   CL   --   100  103   CO2   --   28  26   BUN   --   12  12   CREATININE  0.55  0.48*  0.40*     FASTING LIPID PANEL:  Lab Results   Component Value Date    CHOL 143 08/28/2016    HDL 51 08/28/2016    TRIG 540 08/28/2016     Assessment and Plan:   Principal Problem:    Asthma exacerbation, mild  Active Problems:    Essential hypertension    Dyslipidemia    Parkinson's disease (HCC)    Type 2 diabetes mellitus, without long-term current use of insulin (HCC)    Gastroesophageal reflux disease without esophagitis  Resolved Problems:    * No resolved hospital problems. *    1. Acute Exacerbation of COPD associated with asthma and cigarette smoking  - On O2 via nasal canula   - On Duoneb Nebulizer 1 ampule 4 times daily  - On Proventil 2.5 mg, albuterol sulphate inhaler  - On Levofloxacin 500 mg (3rd day)  - On Montelukast 10 mg nightly  - On Deltasone tablet 40 mg for 5  days (3rd day)              - Will continue to observe    2. Essential HTN  - On Lisinopril     3. DM Type 2  - On Metformin 500 mg BD    4. Hyperlipidemia  - On Atorvastatin 20 mg    5. Parkinsonism  - On Requip 3 mg  - On Seroquel 100 mg  - On Prestiq 100 mg    6. DVT Prophylaxis  - On Lovenox 40 mg SQ    7. Diet  - General Diet      Disposition - Return to group home. Patient will be discharged tomorrow. Needs an observation for one more day. Will continue the same management plan as yesterday.    Laray Anger, MD  PGY-1, Department of Internal Medicine  [facility], [place], [place]  12/29/2016 5:28 AM

## 2016-12-29 NOTE — Plan of Care (Signed)
BRONCHOSPASM/BRONCHOCONSTRICTION     [x]         IMPROVE AERATION/BREATH SOUNDS  [x]   ADMINISTER BRONCHODILATOR THERAPY AS APPROPRIATE  PROVIDE ADEQUATE OXYGENATION WITH ACCEPTABLE SP02/ABG'S    [x]  IDENTIFY APPROPRIATE OXYGEN THERAPY  [x]   MONITOR SP02/ABG'S AS NEEDED   [x]   PATIENT EDUCATION AS NEEDED       ASSESS BREATH SOUNDS  [x]   IMPLEMENT AEROSOL/MDI PROTOCOL  [x]   PATIENT EDUCATION AS NEEDED

## 2016-12-29 NOTE — Progress Notes (Signed)
Occupational Therapy  Facility/Department: [facility] NEURO  Daily Treatment Note  NAME: Shannon Allison  DOB: Sep 13, 1960  MRN: [id number]    Date of Service: 12/29/2016    Discharge Recommendations:  Home with assist PRN       Patient Diagnosis(es): The encounter diagnosis was Intermittent asthma with acute exacerbation, unspecified asthma severity.      has a past medical history of Asthma and Diabetes mellitus (HCC).   has a past surgical history that includes Nasal septum surgery (Bilateral); pr esophagogastroduodenoscopy transoral diagnostic (N/A, 01/04/2016); pr colon ca scrn not hi rsk ind (01/04/2016); and pr colon ca scrn not hi rsk ind (N/A, 02/10/2016).    Restrictions  Restrictions/Precautions  Restrictions/Precautions: General Precautions, Fall Risk, Contact Precautions  Required Braces or Orthoses?: No  Subjective   General  Patient assessed for rehabilitation services?: Yes  Family / Caregiver Present: No  Diagnosis: Asthma exacerbation  Pain Assessment  Patient Currently in Pain: No  Vital Signs  Patient Currently in Pain: No   Orientation  Orientation  Overall Orientation Status: Within Functional Limits  Objective    ADL  Grooming: Supervision- stood at sink  UE Bathing: Modified independent ;Increased time to complete  LE Bathing: Modified independent ;Increased time to complete  UE Dressing: Setup;Supervision  LE Dressing: Setup;Supervision  Toileting: Supervision;Increased time to complete  Additional Comments: pt used RW for support/safety to complete functional transfers (pt took shower on this date and sat on shower chair ) pt demo G safety by using bathroom/shower grab bars to complete transfers. NO LOB. Pt's B UE tremors less severe compared to yesterday session noted.        Balance  Sitting Balance: Modified independent   Standing Balance: Stand by assistance (w/ grab bars or RW in the bathroom)  Standing Balance  Sit to stand: Supervision  Stand to sit: Supervision  Toilet Transfers  Toilet -  Technique: Ambulating  Equipment Used: Raised toilet seat with rails  Toilet Transfer: Supervision  Bed mobility- pt used BR to assist in bed mob noted.    Rolling to Left: Modified independent  Rolling to Right: Modified independent  Supine to Sit: Modified independent  Sit to Supine: Modified independent  Scooting: Modified independent    Transfers  Stand Step Transfers: Contact guard assistance (w/RW)  Sit to stand: Supervision  Stand to sit: Supervision  Attendance  Participation: Active participation      Assessment   Performance deficits / Impairments: Decreased functional mobility ;Decreased endurance;Decreased ADL status;Decreased high-level IADLs;Decreased balance;Decreased safe awareness  Prognosis: Good  Patient Education: Reviewed on OT services/POC, transfer safety, EC/WS, ADLs, fall prevention tips, safety awareness - good return  REQUIRES OT FOLLOW UP: Yes  Activity Tolerance  Activity Tolerance: Patient Tolerated treatment well          Plan   Plan  Times per week: 3x  Current Treatment Recommendations: Location manager, Building services engineer, Safety Education & Training, Self-Care / ADL, Home Management Training, Equities trader, Mining engineer, Education, Public relations account executive term goals  Time Frame for Short term goals: Pt will by discharge   Short term goal 1: demo supine<>sit transfer (I)  Short term goal 2: demo good safety awareness during func mob around room with no AD and (I)  Short term goal 3: demo ADL UB/LB dressing/bathing activity standing with seated rest breaks PRN and mod I  Short term goal 4: demo bending/reaching func activity while standing with mod I to simulate  home       Therapy Time   Individual Concurrent Group Co-treatment   Time In  8:25         Time Out  9:20         Minutes                   Wille Celeste Laffey, COTA/L

## 2016-12-29 NOTE — Progress Notes (Signed)
Patient reports she urinated in her bed.  This RN assisted patient up with walker.  This RN asissited patient with cleaning self and applying a brief.  Patient to chair X1 assistance with walker.  New gown placed on patient.  Bed cleaned and new linens placed on bed.  Call light in reach.  Patient denies any further needs.

## 2016-12-29 NOTE — Plan of Care (Signed)
Problem: Falls - Risk of:  Goal: Absence of physical injury  Absence of physical injury   Outcome: Met This Shift                              Pt assessed as a fall risk this shift. Remains free from falls and accidental injury at this time. Fall precautions in place, including falling star sign. Floor free from obstacles, and bed is locked and in lowest position. Adequate lighting provided. Pt encouraged to call before getting Out Of Bed for any need. Will continue to monitor needs during hourly rounding, and reinforce education on use of call light.

## 2016-12-29 NOTE — Care Coordination-Inpatient (Signed)
Spoke with Ms Kuklinski at bedside.   Is agreeable to home care.  offered freedom of choice, chose [facility].  Call to [facility], referral made.    Also needs nebulizer and rolling walker, freedom of choice provided, chose [facility].  Marlene notified.  Face sheet given.  Will need DME order and face to face for neb

## 2016-12-29 NOTE — Progress Notes (Signed)
Physical Therapy  [facility]  Daily Treatment Note  NAME: Shannon Allison  DOB: 04-22-1960  MRN: [id number]    Chief Complaint   Patient presents with   . Shortness of Breath     2 days ongoing. Hx of Asthma no relief from inhaler.   . Back Pain     Date of Service: 12/29/2016    Discharge Recommendations:  24 hour supervision or assist, Outpatient PT (return to group home)   PT Equipment Recommendations  Equipment Needed: Yes  Mobility Devices: Walker  Walker: Rolling    Patient Diagnosis(es): The primary encounter diagnosis was Intermittent asthma with acute exacerbation, unspecified asthma severity. Diagnoses of Parkinson's disease (HCC) and Asthma exacerbation, mild were also pertinent to this visit.     has a past medical history of Asthma and Diabetes mellitus (HCC).   has a past surgical history that includes Nasal septum surgery (Bilateral); pr esophagogastroduodenoscopy transoral diagnostic (N/A, 01/04/2016); pr colon ca scrn not hi rsk ind (01/04/2016); and pr colon ca scrn not hi rsk ind (N/A, 02/10/2016).    Restrictions  Restrictions/Precautions  Restrictions/Precautions: General Precautions, Fall Risk, Contact Precautions  Required Braces or Orthoses?: No  Subjective   General  Chart Reviewed: Yes  Response To Previous Treatment: Patient with no complaints from previous session.  Family / Caregiver Present: No  Subjective  Subjective: Pt and RN agreeable to PT. Pt resting in chair upon arrival.   Pain Screening  Patient Currently in Pain: Yes  Pain Assessment  Pain Assessment: 0-10  Pain Level: 6  Pain Type: Acute pain  Pain Location: Head  Pain Intervention(s): Ambulation/Increased activity;Emotional support;Distraction  Response to Pain Intervention: Patient Satisfied  Vital Signs  Patient Currently in Pain: Yes       Orientation  Orientation  Overall Orientation Status: Within Normal Limits  Objective   Bed mobility  Comment: Unable to assess. pt began and ended session in  chair  Transfers  Sit to Stand: Contact guard assistance  Stand to sit: Contact guard assistance  Ambulation  Ambulation?: Yes  Ambulation 1  Surface: level tile  Device: Rolling Walker  Other Apparatus: O2  Quality of Gait: Pt demonstrated decreased step length and shuffling gait with decreased gait speed. Mild unsteadiness and left drift noted   Distance: 3160ft2  Comments: Pt required V/Cs to reposition RW d/t left drift w/ good carryover noted. Pt required 1 standing rest break during ambulation   Stairs/Curb  Stairs?: No  Balance  Posture: Good  Sitting - Static: Good  Sitting - Dynamic: Good  Standing - Static: Good;-  Standing - Dynamic: Fair;+  Comments: standing balance assessed with RW  Exercises  Comments: Seated LE exercise program: Long Arc Quads, hip abduction/adduction, heel/toe raises, and marches. Reps: x10 each, Standing exercise program:  hip flexion, marches, and hamstring curls. Reps: x 10 each. Upper extremity exercises: Bicep curl, shoulder flexion/extension, punches, tricep curl, shoulder abduction/adduction. Reps: x10 w/ red TB (pt required rest break after standing exercise)      Assessment   Body structures, Functions, Activity limitations: Decreased functional mobility ;Decreased ROM;Decreased strength;Decreased endurance  Assessment: Pt able to ambulate 5860ftx2 with RW and CGA. Pt required V/Cs for left drift with RW w/ good carryover noted. Pt could return to group home with continued PT.    Prognosis: Good  Decision Making: Medium Complexity  Patient Education: pt educated on POC  REQUIRES PT FOLLOW UP: Yes  Activity Tolerance  Activity Tolerance: Patient Tolerated treatment  well;Patient limited by endurance     Goals  Short term goals  Time Frame for Short term goals: 14 visits  Short term goal 1: Pt to ambulate 328ft with RW and CGA  Short term goal 2: Pt to tolerate at elast of UE/LE exercises in order to increase overall strength  Short term goal 3: Pt to perform bed mobility  and transfers independently    Plan    Plan  Times per week: 5-6x/wk  Current Treatment Recommendations: Strengthening, ROM, Balance Training, Building services engineer, Teacher, early years/pre, Investment banker, operational, Chief Technology Officer Devices  Type of devices: Call light within reach, Gait belt, All fall risk precautions in place, Nurse notified, Left in chair     Therapy Time   Individual Concurrent Group Co-treatment   Time In 1305         Time Out 1328         Minutes 23         Timed Code Treatment Minutes: 23 Minutes       Carissa Emerick, SPT  Evaluation/treatment performed by Student PT under the supervision of co-signing PT who agrees with all evaluation/treatment and documentation.

## 2016-12-29 NOTE — Progress Notes (Signed)
Shannon Allison was evaluated today and a DME order was entered for a wheeled walker because she requires this to successfully complete daily living tasks of eating, bathing, toileting, personal cares, ambulating, meal preparation and taking own medications.  A wheeled walker is necessary due to the patient's unsteady gait, upper body weakness, and inability to pick up an ambulation device; and she can ambulate only by pushing a walker instead of a lesser assistive device such as a cane, crutch, or standard walker.  The need for this equipment was discussed with the patient and she understands and is in agreement.    Laray Angerizwan Ishtiaq, MD  PGY-1, Department of Internal Medicine  [facility], [place], [place]  12/29/2016 9:09 AM

## 2016-12-30 LAB — CBC WITH AUTO DIFFERENTIAL
Absolute Eos #: 0 10*3/uL (ref 0.0–0.4)
Absolute Immature Granulocyte: 0 10*3/uL (ref 0.00–0.30)
Absolute Lymph #: 2.73 10*3/uL (ref 1.0–4.8)
Absolute Mono #: 0.75 10*3/uL (ref 0.1–0.8)
Basophils Absolute: 0 10*3/uL (ref 0.0–0.2)
Basophils: 0 % (ref 0–2)
Eosinophils %: 0 % — ABNORMAL LOW (ref 1–4)
Hematocrit: 38.9 % (ref 36.3–47.1)
Hemoglobin: 12.2 g/dL (ref 11.9–15.1)
Immature Granulocytes: 0 %
Lymphocytes: 29 % (ref 24–44)
MCH: 28.5 pg (ref 25.2–33.5)
MCHC: 31.4 g/dL (ref 28.4–34.8)
MCV: 90.9 fL (ref 82.6–102.9)
MPV: 10.3 fL (ref 8.1–13.5)
Monocytes: 8 % — ABNORMAL HIGH (ref 1–7)
Morphology: NORMAL
NRBC Automated: 0 per 100 WBC
Platelets: 266 10*3/uL (ref 138–453)
RBC: 4.28 m/uL (ref 3.95–5.11)
RDW: 13.2 % (ref 11.8–14.4)
Seg Neutrophils: 63 % (ref 36–66)
Segs Absolute: 5.92 10*3/uL (ref 1.8–7.7)
WBC: 9.4 10*3/uL (ref 3.5–11.3)

## 2016-12-30 LAB — BASIC METABOLIC PANEL W/ REFLEX TO MG FOR LOW K
Anion Gap: 13 mmol/L (ref 9–17)
BUN: 16 mg/dL (ref 6–20)
CO2: 27 mmol/L (ref 20–31)
Calcium: 9 mg/dL (ref 8.6–10.4)
Chloride: 99 mmol/L (ref 98–107)
Creatinine: 0.53 mg/dL (ref 0.50–0.90)
GFR African American: >60 mL/min (ref >60)
GFR Non-African American: >60 mL/min (ref >60)
Glucose: 89 mg/dL (ref 70–99)
Potassium: 4.1 mmol/L (ref 3.7–5.3)
Sodium: 139 mmol/L (ref 135–144)

## 2016-12-30 LAB — POC GLUCOSE FINGERSTICK: POC Glucose: 86 mg/dL (ref 65–105)

## 2016-12-30 MED ORDER — IPRATROPIUM-ALBUTEROL 0.5-2.5 (3) MG/3ML IN SOLN
RESPIRATORY_TRACT | 3 refills | Status: DC | PRN
Start: 2016-12-30 — End: 2016-12-31

## 2016-12-30 MED ORDER — MAGNESIUM HYDROXIDE 400 MG/5ML PO SUSP
400 MG/5ML | Freq: Every day | ORAL | Status: DC | PRN
Start: 2016-12-30 — End: 2016-12-31

## 2016-12-30 MED ORDER — ALBUTEROL SULFATE (2.5 MG/3ML) 0.083% IN NEBU
RESPIRATORY_TRACT | 3 refills | Status: DC | PRN
Start: 2016-12-30 — End: 2016-12-31

## 2016-12-30 MED ORDER — LEVOFLOXACIN 500 MG PO TABS
500 MG | ORAL_TABLET | Freq: Every day | ORAL | 0 refills | Status: DC
Start: 2016-12-30 — End: 2016-12-31

## 2016-12-30 MED ORDER — TIOTROPIUM BROMIDE MONOHYDRATE 18 MCG IN CAPS
18 MCG | ORAL_CAPSULE | Freq: Every day | RESPIRATORY_TRACT | 3 refills | Status: DC
Start: 2016-12-30 — End: 2016-12-31

## 2016-12-30 MED ORDER — SENNA-DOCUSATE SODIUM 8.6-50 MG PO TABS
ORAL_TABLET | Freq: Every day | ORAL | 2 refills | Status: DC
Start: 2016-12-30 — End: 2016-12-31

## 2016-12-30 MED ORDER — ALBUTEROL SULFATE (2.5 MG/3ML) 0.083% IN NEBU
Freq: Four times a day (QID) | RESPIRATORY_TRACT | Status: DC | PRN
Start: 2016-12-30 — End: 2016-12-31
  Administered 2016-12-31: 07:00:00 2.5 mg via RESPIRATORY_TRACT

## 2016-12-30 MED ORDER — NICOTINE 21 MG/24HR TD PT24
21 MG/24HR | MEDICATED_PATCH | Freq: Every day | TRANSDERMAL | 3 refills | Status: DC
Start: 2016-12-30 — End: 2016-12-31

## 2016-12-30 MED ORDER — ACETAMINOPHEN 325 MG PO TABS
325 MG | ORAL_TABLET | ORAL | 0 refills | Status: DC | PRN
Start: 2016-12-30 — End: 2016-12-31

## 2016-12-30 MED ORDER — PREDNISONE 20 MG PO TABS
20 MG | ORAL_TABLET | Freq: Every day | ORAL | 0 refills | Status: DC
Start: 2016-12-30 — End: 2016-12-31

## 2016-12-30 MED FILL — LIPITOR 20 MG PO TABS: 20 MG | ORAL | Qty: 1

## 2016-12-30 MED FILL — PREDNISONE 20 MG PO TABS: 20 MG | ORAL | Qty: 2

## 2016-12-30 MED FILL — LISINOPRIL 10 MG PO TABS: 10 MG | ORAL | Qty: 1

## 2016-12-30 MED FILL — METFORMIN HCL 500 MG PO TABS: 500 MG | ORAL | Qty: 1

## 2016-12-30 MED FILL — ROPINIROLE HCL 1 MG PO TABS: 1 MG | ORAL | Qty: 3

## 2016-12-30 MED FILL — SEROQUEL 100 MG PO TABS: 100 MG | ORAL | Qty: 1

## 2016-12-30 MED FILL — LEVOFLOXACIN 500 MG PO TABS: 500 MG | ORAL | Qty: 1

## 2016-12-30 MED FILL — FOLIC ACID 1 MG PO TABS: 1 MG | ORAL | Qty: 1

## 2016-12-30 MED FILL — NICOTINE 21 MG/24HR TD PT24: 21 MG/24HR | TRANSDERMAL | Qty: 1

## 2016-12-30 MED FILL — PRISTIQ 50 MG PO TB24: 50 MG | ORAL | Qty: 2

## 2016-12-30 MED FILL — PANTOPRAZOLE SODIUM 40 MG PO TBEC: 40 MG | ORAL | Qty: 1

## 2016-12-30 MED FILL — ENOXAPARIN SODIUM 40 MG/0.4ML SC SOLN: 40 MG/0.4ML | SUBCUTANEOUS | Qty: 0.4

## 2016-12-30 MED FILL — IPRATROPIUM-ALBUTEROL 0.5-2.5 (3) MG/3ML IN SOLN: RESPIRATORY_TRACT | Qty: 3

## 2016-12-30 MED FILL — MAPAP 325 MG PO TABS: 325 MG | ORAL | Qty: 2

## 2016-12-30 MED FILL — LORAZEPAM 0.5 MG PO TABS: 0.5 MG | ORAL | Qty: 2

## 2016-12-30 MED FILL — MONTELUKAST SODIUM 10 MG PO TABS: 10 MG | ORAL | Qty: 1

## 2016-12-30 MED FILL — SENNOSIDES-DOCUSATE SODIUM 8.6-50 MG PO TABS: ORAL | Qty: 1

## 2016-12-30 NOTE — Progress Notes (Signed)
Home Oxygen Evaluation    Home Oxygen Evaluation completed.    Patient is on 2 liters per minute via nasal cannula.  Resting SpO2 = 92%  Resting SpO2 on room air = 91%    SpO2 on room air with exercise = 86%  SpO2 on oxygen as above with exercise = 90%    Nocturnal Oximetry with patient on room air is recommended is SpO2 is between 89% and 95% (requires additional order).    Yonas Bunda M Aryiah Monterosso  1:34 PM

## 2016-12-30 NOTE — Progress Notes (Signed)
Patient was evaluated today for the diagnosis of COPD.  I entered a DME order for home oxygen because the diagnosis and testing requires the patient to have supplemental oxygen.  Condition will improve or be benefited by oxygen use.  The patient is not able to perform good mobility in a home setting and therefore does require the use of a portable oxygen system.  The need for this equipment was discussed with the patient and she understands and is in agreement.    Laray Angerizwan Onedia Vargus, MD  PGY-1, Department of Internal Medicine  [facility], [place], [place]  12/30/2016 2:30 PM

## 2016-12-30 NOTE — Care Coordination-Inpatient (Signed)
Transition planning:     Updated by bedside RN that pt is transitioning  Home today and will need DME provided. Note DME has been requested through [facility].     [facility] needs Nebul[facility], Home O2 with transport tank, and RW. Needs orders and F2F co-signed by attending. Also reviewed resp. Therapy eval for Home O2. Request to send orders, available face to face and O2 eval., in addition to clinical notes. Above faxed to [phone] as requested.     1611 Call placed to [facility] noted e-referral not previously sent, faxed at this time. Phone not answered    1617 Call placed back to [facility] spoke with  Yvonna AlanisKaitlyn updated that pending transition home today. She states they are able to accept.   1620 Bedside nurse updates writer that the pt discharge is being held at this time.     1622 [facility] updated on above that d/c is being held and attending will complete all signatures for DME tomorrow.     1623 Call back to [facility] updated pt d/c held, [facility].

## 2016-12-30 NOTE — Plan of Care (Signed)
Problem: Pain:  Goal: Pain level will decrease  Pain level will decrease   Outcome: Ongoing    Goal: Control of acute pain  Control of acute pain   Outcome: Ongoing    Goal: Control of chronic pain  Control of chronic pain   Outcome: Ongoing

## 2016-12-30 NOTE — Plan of Care (Signed)
Problem: RESPIRATORY  Intervention: Respiratory assessment  BRONCHOSPASM/BRONCHOCONSTRICTION     [x]         IMPROVE AERATION/BREATH SOUNDS  [x]   ADMINISTER BRONCHODILATOR THERAPY AS APPROPRIATE  [x]   ASSESS BREATH SOUNDS  [x]   IMPLEMENT AEROSOL/MDI PROTOCOL  [x]   PATIENT EDUCATION AS NEEDED

## 2016-12-30 NOTE — Progress Notes (Signed)
[facility]     Department of Internal Medicine - Staff Internal Medicine Service     DAILY PROGRESS NOTE       Patient:  Shannon Allison  Date of Birth: [birth year]  MRN: [id number]     Acct: [id number]     Admit date: 12/26/2016  Admitting Diagnosis: Asthma exacerbation, mild    Subjective:   Patient seen and examined at bedside. No acute events overnight. O2 saturation is 90-91 even on O2. Vitals and labs are fine. Tolerating food. Able to go to the restroom with help. Patient is ok to be discharged after Home O2 evaluation.    Review of Systems   Constitutional: Negative for chills, fever, malaise/fatigue and weight loss.   Respiratory: Positive for shortness of breath and wheezing. Negative for cough, hemoptysis and sputum production.    Cardiovascular: Negative for chest pain, palpitations, orthopnea and claudication.   Gastrointestinal: Negative for abdominal pain, diarrhea, heartburn, nausea and vomiting.   Genitourinary: Negative for dysuria, frequency, hematuria and urgency.   Musculoskeletal: Negative for back pain, joint pain, myalgias and neck pain.   Skin: Negative for itching and rash.     Objective:   BP 137/83   Pulse 81   Temp 97.7 F (36.5 C) (Oral)   Resp 19   Ht  (1.753 m)   Wt 213 lb 13.5 oz (97 kg)   SpO2 92%   BMI 31.58 kg/m       General appearance - alert, well appearing, and in no distress  Mental status - alert, oriented to person, place, and time  Eyes - pupils equal and reactive, extraocular eye movements intact  Mouth - mucous membranes moist, pharynx normal without lesions  Chest - bilateral wheezes in the lungs  Heart - normal rate, regular rhythm, normal S1, S2, no murmurs, rubs, clicks or gallops  Abdomen - soft, nontender, nondistended, no masses or organomegaly  Neurological - alert, oriented, normal speech, no focal findings or movement disorder noted  Musculoskeletal - no joint tenderness, deformity or swelling  Extremities - peripheral  pulses normal, no pedal edema, no clubbing or cyanosis  Skin - normal coloration and turgor, no rashes, no suspicious skin lesions noted      Intake/Output Summary (Last 24 hours) at 12/30/16 1028  Last data filed at 12/30/16 0905   Gross per 24 hour   Intake             1310 ml   Output                4 ml   Net             1306 ml         Medications:     . QUEtiapine  100 mg Oral Nightly   . ipratropium-albuterol  1 ampule Inhalation 4x daily   . levofloxacin  500 mg Oral Daily   . nicotine  1 patch Transdermal Daily   . desvenlafaxine succinate  100 mg Oral Daily   . atorvastatin  20 mg Oral Nightly   . fluticasone  1 spray Nasal Daily   . folic acid  1 mg Oral Daily   . lisinopril  5 mg Oral Daily   . metFORMIN  500 mg Oral BID WC   . montelukast  10 mg Oral Nightly   . rOPINIRole  3 mg Oral TID   . sodium chloride flush  10 mL Intravenous 2 times per day   .  enoxaparin  40 mg Subcutaneous Daily   . predniSONE  40 mg Oral Daily   . sennosides-docusate sodium  1 tablet Oral Daily   . pantoprazole  40 mg Oral QAM AC       Diagnostic Labs and Imaging    CBC: Recent Labs      12/30/16   0619   WBC  9.4   RBC  4.28   HGB  12.2   HCT  38.9   MCV  90.9   RDW  13.2   PLT  266     BMP: Recent Labs      12/28/16   0851  12/30/16   0619   NA  144  139   K  3.9  4.1   CL  103  99   CO2  26  27   BUN  12  16   CREATININE  0.40*  0.53     FASTING LIPID PANEL:  Lab Results   Component Value Date    CHOL 143 08/28/2016    HDL 51 08/28/2016    TRIG 161102 08/28/2016     Assessment and Plan:   Principal Problem:    Asthma exacerbation, mild  Active Problems:    Essential hypertension    Dyslipidemia    Parkinson's disease (HCC)    Type 2 diabetes mellitus, without long-term current use of insulin (HCC)    Gastroesophageal reflux disease without esophagitis  Resolved Problems:    * No resolved hospital problems. *    1. Acute Exacerbation of COPD associated with asthma and cigarette smoking  - On O2 via nasal canula    - On Duoneb Nebulizer 1 ampule 4 times daily  - On Proventil 2.5 mg, albuterol sulphate inhaler  - On Levofloxacin 500 mg (4rd day)  - On Montelukast 10 mg nightly  - On Deltasone tablet 40 mg for 5 days (3rd day)  - Will continue to observe    2. Essential HTN  - On Lisinopril 5mg     3. DM Type 2  - On Metformin 500 mg BD    4. Hyperlipidemia  - On Atorvastatin 20 mg    5. Parkinsonism  - On Requip 3 mg  - On Seroquel 100 mg  - On Prestiq 100 mg    6. DVT Prophylaxis  - On Lovenox 40 mg SQ    7. Diet  - General Diet      Disposition - Return to group home. Patient will be discharged today after Home O2 eval.      Laray Angerizwan Ishtiaq, MD  PGY-1, Department of Internal Medicine  [facility], [place], [place]  12/30/2016 10:28 AM    Attending Physician Statement    Active Hospital Problems    Diagnosis Date Noted   . Asthma exacerbation, mild [J45.901] 12/26/2016   . Type 2 diabetes mellitus, without long-term current use of insulin (HCC) [E11.9] 12/26/2016   . Gastroesophageal reflux disease without esophagitis [K21.9] 12/26/2016   . Parkinson's disease (HCC) [G20] 04/24/2016   . Essential hypertension [I10] 02/28/2016   . Dyslipidemia [E78.5] 02/28/2016       I have discussed the case, including pertinent history and exam findings with the resident and the team.  I have seen and examined the patient and the key elements of the encounter have been performed by me.  I agree with the assessment, plan and orders as documented by the resident.    Remains about the same. Some  improvement  Home o2 eval  D/c    Cindee Lame, MD, [facility] Physician - [facility] & [facility]  Associate Program Director Department of Internal Medicine  Internal Medicine  [facility] Site Director- [facility]  12/30/2016, 12:17 PM

## 2016-12-31 MED ORDER — MONTELUKAST SODIUM 10 MG PO TABS
10 MG | ORAL_TABLET | Freq: Every evening | ORAL | 3 refills | Status: DC
Start: 2016-12-31 — End: 2017-11-17

## 2016-12-31 MED ORDER — DESVENLAFAXINE SUCCINATE ER 100 MG PO TB24
100 MG | ORAL_TABLET | Freq: Every day | ORAL | 3 refills | Status: DC
Start: 2016-12-31 — End: 2017-11-17

## 2016-12-31 MED ORDER — METFORMIN HCL 500 MG PO TABS
500 MG | ORAL_TABLET | Freq: Two times a day (BID) | ORAL | 3 refills | Status: DC
Start: 2016-12-31 — End: 2017-11-17

## 2016-12-31 MED ORDER — ALBUTEROL SULFATE (2.5 MG/3ML) 0.083% IN NEBU
RESPIRATORY_TRACT | 3 refills | Status: DC | PRN
Start: 2016-12-31 — End: 2017-11-17

## 2016-12-31 MED ORDER — ROPINIROLE HCL 1 MG PO TABS
1 MG | ORAL_TABLET | Freq: Three times a day (TID) | ORAL | 5 refills | Status: DC
Start: 2016-12-31 — End: 2017-01-03

## 2016-12-31 MED ORDER — FOLIC ACID 1 MG PO TABS
1 MG | ORAL_TABLET | Freq: Every day | ORAL | 3 refills | Status: DC
Start: 2016-12-31 — End: 2017-11-17

## 2016-12-31 MED ORDER — ATORVASTATIN CALCIUM 20 MG PO TABS
20 MG | ORAL_TABLET | Freq: Every day | ORAL | 3 refills | Status: DC
Start: 2016-12-31 — End: 2017-11-17

## 2016-12-31 MED ORDER — ESOMEPRAZOLE MAGNESIUM 40 MG PO PACK
40 MG | PACK | Freq: Every day | ORAL | 3 refills | Status: DC
Start: 2016-12-31 — End: 2017-11-17

## 2016-12-31 MED ORDER — ROPINIROLE HCL 2 MG PO TABS
2 MG | ORAL_TABLET | Freq: Three times a day (TID) | ORAL | 2 refills | Status: DC
Start: 2016-12-31 — End: 2017-01-03

## 2016-12-31 MED ORDER — LISINOPRIL 5 MG PO TABS
5 MG | ORAL_TABLET | Freq: Every day | ORAL | 3 refills | Status: DC
Start: 2016-12-31 — End: 2017-11-17

## 2016-12-31 MED ORDER — LORAZEPAM 1 MG PO TABS
1 MG | ORAL_TABLET | Freq: Three times a day (TID) | ORAL | 0 refills | Status: AC | PRN
Start: 2016-12-31 — End: 2017-01-05

## 2016-12-31 MED ORDER — HYDROXYZINE PAMOATE 25 MG PO CAPS
25 MG | ORAL_CAPSULE | Freq: Three times a day (TID) | ORAL | 2 refills | Status: DC | PRN
Start: 2016-12-31 — End: 2017-11-17

## 2016-12-31 MED ORDER — MAGNESIUM HYDROXIDE 400 MG/5ML PO SUSP
400 MG/5ML | Freq: Every day | ORAL | Status: DC | PRN
Start: 2016-12-31 — End: 2017-11-17

## 2016-12-31 MED ORDER — TIOTROPIUM BROMIDE MONOHYDRATE 18 MCG IN CAPS
18 MCG | ORAL_CAPSULE | Freq: Every day | RESPIRATORY_TRACT | 3 refills | Status: DC
Start: 2016-12-31 — End: 2017-11-17

## 2016-12-31 MED ORDER — FLUTICASONE PROPIONATE 50 MCG/ACT NA SUSP
50 MCG/ACT | Freq: Every day | NASAL | 3 refills | Status: DC
Start: 2016-12-31 — End: 2017-11-17

## 2016-12-31 MED ORDER — ACETAMINOPHEN 325 MG PO TABS
325 MG | ORAL_TABLET | ORAL | 0 refills | Status: DC | PRN
Start: 2016-12-31 — End: 2017-11-17

## 2016-12-31 MED ORDER — SENNA-DOCUSATE SODIUM 8.6-50 MG PO TABS
ORAL_TABLET | Freq: Every day | ORAL | 2 refills | Status: DC
Start: 2016-12-31 — End: 2017-11-17

## 2016-12-31 MED ORDER — IPRATROPIUM-ALBUTEROL 0.5-2.5 (3) MG/3ML IN SOLN
RESPIRATORY_TRACT | 3 refills | Status: DC | PRN
Start: 2016-12-31 — End: 2017-11-17

## 2016-12-31 MED ORDER — NICOTINE 21 MG/24HR TD PT24
21 MG/24HR | MEDICATED_PATCH | Freq: Every day | TRANSDERMAL | 3 refills | Status: DC
Start: 2016-12-31 — End: 2017-06-21

## 2016-12-31 MED ORDER — QUETIAPINE FUMARATE 100 MG PO TABS
100 MG | ORAL_TABLET | Freq: Every evening | ORAL | 3 refills | Status: DC
Start: 2016-12-31 — End: 2017-11-17

## 2016-12-31 MED FILL — ENOXAPARIN SODIUM 40 MG/0.4ML SC SOLN: 40 MG/0.4ML | SUBCUTANEOUS | Qty: 0.4

## 2016-12-31 MED FILL — LISINOPRIL 10 MG PO TABS: 10 MG | ORAL | Qty: 1

## 2016-12-31 MED FILL — ALBUTEROL SULFATE (2.5 MG/3ML) 0.083% IN NEBU: RESPIRATORY_TRACT | Qty: 3

## 2016-12-31 MED FILL — PREDNISONE 20 MG PO TABS: 20 MG | ORAL | Qty: 2

## 2016-12-31 MED FILL — LORAZEPAM 0.5 MG PO TABS: 0.5 MG | ORAL | Qty: 2

## 2016-12-31 MED FILL — FOLIC ACID 1 MG PO TABS: 1 MG | ORAL | Qty: 1

## 2016-12-31 MED FILL — NICOTINE 21 MG/24HR TD PT24: 21 MG/24HR | TRANSDERMAL | Qty: 1

## 2016-12-31 MED FILL — SEROQUEL 100 MG PO TABS: 100 MG | ORAL | Qty: 1

## 2016-12-31 MED FILL — SENNOSIDES-DOCUSATE SODIUM 8.6-50 MG PO TABS: ORAL | Qty: 1

## 2016-12-31 MED FILL — ROPINIROLE HCL 1 MG PO TABS: 1 MG | ORAL | Qty: 3

## 2016-12-31 MED FILL — PRISTIQ 50 MG PO TB24: 50 MG | ORAL | Qty: 2

## 2016-12-31 MED FILL — LEVOFLOXACIN 500 MG PO TABS: 500 MG | ORAL | Qty: 1

## 2016-12-31 MED FILL — MAPAP 325 MG PO TABS: 325 MG | ORAL | Qty: 2

## 2016-12-31 MED FILL — HYDROXYZINE HCL 25 MG PO TABS: 25 MG | ORAL | Qty: 1

## 2016-12-31 MED FILL — METFORMIN HCL 500 MG PO TABS: 500 MG | ORAL | Qty: 1

## 2016-12-31 MED FILL — PANTOPRAZOLE SODIUM 40 MG PO TBEC: 40 MG | ORAL | Qty: 1

## 2016-12-31 MED FILL — LIPITOR 20 MG PO TABS: 20 MG | ORAL | Qty: 1

## 2016-12-31 MED FILL — MONTELUKAST SODIUM 10 MG PO TABS: 10 MG | ORAL | Qty: 1

## 2016-12-31 NOTE — Progress Notes (Signed)
Home Oxygen Evaluation    Home Oxygen Evaluation completed.    Patient is on room air.  Resting SpO2 = 94%  Resting SpO2 on room air = 94%    SpO2 on room air with exercise = 91%  SpO2 on oxygen as above with exercise = Not on O2%    Nocturnal Oximetry with patient on room air is recommended is SpO2 is between 89% and 95% (requires additional order).    Shannon Allison  10:25 AM

## 2016-12-31 NOTE — Discharge Summary (Signed)
[facility]     Department of Internal Medicine - Staff Internal Medicine Service    INPATIENT DISCHARGE SUMMARY        Patient Identification:  Shannon Allison is a 56 y.o. female.  DOB:  1960-09-27  MRN: [id number]     Acct: [id number]   Admit Date:  12/26/2016  Discharge date and time: 12/31/2016  4:16 PM   Attending Provider: No att. providers found                                     Admission Diagnoses:   Asthma exacerbation, mild [J45.901]    Discharge Diagnoses:   Principal Problem:    Asthma exacerbation, mild  Active Problems:    Essential hypertension    Dyslipidemia    Parkinson's disease (HCC)    Type 2 diabetes mellitus, without long-term current use of insulin (HCC)    Gastroesophageal reflux disease without esophagitis  Resolved Problems:    * No resolved hospital problems. *       Consults:   none    Brief Inpatient course:    This 56 year old female with a past medical history of asthma, hypertension, Parkinson disease presented to the hospital with shortness of breath for the last 2 days.  Because of COPD exacerbation, she was started on DuoNeb every 4 hours.  The patient received 125 mg of prednisone in the ER.  On the floor she was started on 40 mg of oral prednisone tablet.  She was given lisinopril for hypertension and metformin twice a day for diabetes.  Her montelukast was continued.  She was given Requip for Parkinson disease which is her home medication.  She was continued on Pristiq and vistaril as prescribed by [facility] for depression.  She was also started on Proventil 2.5 mg, albuterol sulfate inhaler and levofloxacin 500 mg.  With careful observation, the clinical condition of the patient continued to improve.  She was tolerating diet fine and ambulating.  After almost 5 days, the patient was discharged from the hospital in stable condition.    Procedures:      Any Hospital Acquired Infections: none    Discharge Functional Status:  stable    Disposition:  home    Patient Instructions:   Discharge Medication List as of 12/31/2016 12:34 PM      CONTINUE these medications which have CHANGED    Details   acetaminophen (TYLENOL) 325 MG tablet Take 2 tablets by mouth every 4 hours as needed for Pain or Fever, Disp-30 tablet, R-0Normal      hydrOXYzine (VISTARIL) 25 MG capsule Take 1 capsule by mouth 3 times daily as needed for Itching, Disp-90 capsule, R-2Normal      LORazepam (ATIVAN) 1 MG tablet Take 1 tablet by mouth every 8 hours as needed for Anxiety for up to 5 days.., Disp-15 tablet, R-0Print      albuterol (PROVENTIL) (2.5 MG/3ML) 0.083% nebulizer solution Take 3 mLs by nebulization every 4 hours as needed for Wheezing or Shortness of Breath, Disp-120 mL, R-3Normal      ipratropium-albuterol (DUONEB) 0.5-2.5 (3) MG/3ML SOLN nebulizer solution Inhale 3 mLs into the lungs every 4 hours as needed for Shortness of Breath, Disp-360 mL, R-3Normal      montelukast (SINGULAIR) 10 MG tablet Take 1 tablet by mouth nightly, Disp-30 tablet, R-3Normal      tiotropium (SPIRIVA HANDIHALER) 18 MCG inhalation capsule  Inhale 1 capsule into the lungs daily, Disp-30 capsule, R-3Normal      desvenlafaxine succinate (PRISTIQ) 100 MG TB24 extended release tablet Take 1 tablet by mouth daily, Disp-30 tablet, R-3Normal      metFORMIN (GLUCOPHAGE) 500 MG tablet Take 1 tablet by mouth 2 times daily (with meals), Disp-60 tablet, R-3Normal      atorvastatin (LIPITOR) 20 MG tablet Take 1 tablet by mouth daily, Disp-30 tablet, R-3Normal      lisinopril (PRINIVIL;ZESTRIL) 5 MG tablet Take 1 tablet by mouth daily, Disp-30 tablet, R-3Normal      !! rOPINIRole (REQUIP) 1 MG tablet Take 1 tablet by mouth 3 times daily In addition to a 2 mg tablet for a total of 3 mg TID, Disp-90 tablet, R-5Normal      !! rOPINIRole (REQUIP) 2 MG tablet Take 1 tablet by mouth 3 times daily, Disp-90 tablet, R-2Normal      QUEtiapine (SEROQUEL) 100 MG tablet Take 1 tablet by mouth nightly, Disp-60 tablet, R-3Normal       fluticasone (FLONASE) 50 MCG/ACT nasal spray 1 spray by Nasal route daily, Disp-1 Bottle, R-3Normal      folic acid (FOLVITE) 1 MG tablet Take 1 tablet by mouth daily, Disp-30 tablet, R-3Normal      magnesium hydroxide (MILK OF MAGNESIA) 400 MG/5ML suspension Take 30 mLs by mouth daily as needed for ConstipationOTC      sennosides-docusate sodium (SENOKOT-S) 8.6-50 MG tablet Take 1 tablet by mouth daily, Disp-30 tablet, R-2Normal      nicotine (NICODERM CQ) 21 MG/24HR Place 1 patch onto the skin daily, Disp-30 patch, R-3Normal      esomeprazole Magnesium (NEXIUM) 40 MG PACK Take 1 packet by mouth daily, Disp-30 packet, R-3Normal       !! - Potential duplicate medications found. Please discuss with provider.      CONTINUE these medications which have NOT CHANGED    Details   paliperidone palmitate (INVEGA SUSTENNA) 234 MG/1.5ML SUSP IM injection Inject 234 mg into the muscle every 30 daysHistorical Med         STOP taking these medications       predniSONE (DELTASONE) 20 MG tablet Comments:   Reason for Stopping:         levofloxacin (LEVAQUIN) 500 MG tablet Comments:   Reason for Stopping:         famotidine (PEPCID) 20 MG tablet Comments:   Reason for Stopping:         polyethylene glycol (GLYCOLAX) packet Comments:   Reason for Stopping:               Activity: activity as tolerated    Diet: diabetic diet    Follow-up:    Anise Salvo, APRN - CNP  [address]  [place] [address]  [phone]          [facility]  [address]  [place] [address]  [phone]  In 4 weeks  COPD    [facility]  [address]  [place] [address]  [phone]        Anise Salvo, APRN - CNP  [address]  [place] [address]  [phone]    In 1 week        Follow up labs:     Follow up imaging:     Note that over 30 minutes was spent in preparing discharge papers, discussing discharge with patient, medication review, etc.      Mazell Aylesworth  Tonita Phoenix, MD         Department of Internal Medicine  [facility], [place]         01/01/2017, 12:11 PM

## 2016-12-31 NOTE — Progress Notes (Signed)
CLINICAL PHARMACY NOTE: MEDS TO BEDS    [facility] Patient?: No  Total # of Prescriptions Filled: 11   The following medications were delivered to the patient:   Albuterol   Acetaminophen   Ropinirole   Quetiapine   duoneb   flonase    Folic acid   Atorvastatin   Hydroxyzine   Lisinopril   Nicotine patch  Total # of Interventions Completed: 0  Time Spent (min): 90    Additional Documentation:

## 2016-12-31 NOTE — Discharge Instructions (Signed)
Continuity of Care Form    Patient Name: Shannon Allison   DOB:  [birth year]  MRN:  [id number]    Admit date:  12/26/2016  Discharge date:  12/31/16    Code Status Order: Full Code   Advance Directives: No    Admitting Physician:  Mikey Kirschner, MD  PCP: Anise Salvo, APRN - CNP    Discharging Nurse: [facility] Unit/Room#: 0537/0537-01  Discharging Unit Phone Number: [phone]    Emergency Contact:        Past Surgical History:  Past Surgical History:   Procedure Laterality Date   . NASAL SEPTUM SURGERY Bilateral    . PR COLON CA SCRN NOT HI RSK IND  01/04/2016    COLONOSCOPY performed by Link Snuffer, MD at [facility]   . PR COLON CA SCRN NOT HI RSK IND N/A 02/10/2016    COLONOSCOPY; incomplete; aborted due to poor prep performed by Link Snuffer, MD at [facility]   . PR ESOPHAGOGASTRODUODENOSCOPY TRANSORAL DIAGNOSTIC N/A 01/04/2016    EGD ESOPHAGOGASTRODUODENOSCOPY performed by Link Snuffer, MD at [facility]       Immunization History:   Immunization History   Administered Date(s) Administered   . Influenza, Quadv, 6 mo and older, IM, PF (Flulaval, Fluarix) 12/28/2016       Active Problems:  Patient Active Problem List   Diagnosis Code   . Essential hypertension I10   . Dyslipidemia E78.5   . Schizophrenia (HCC) F20.9   . Tremor R25.1   . Parkinson's disease (HCC) G20   . Asthma exacerbation, mild J45.901   . Type 2 diabetes mellitus, without long-term current use of insulin (HCC) E11.9   . Gastroesophageal reflux disease without esophagitis K21.9       Isolation/Infection:   Isolation          Contact        Patient Infection Status     Infection Encounter Level? Added Added By Resolved Resolved By Review Date Onset Date    MDRO (multi-drug resistant organism) No 08/24/15 Noreene Filbert, RN        E. Coli - urine 08/2015            Nurse Assessment:  Last Vital Signs: BP 128/78   Pulse 79   Temp 96.7 F (35.9 C)   Resp 23   Ht  (1.753 m)   Wt 213 lb 13.5 oz  (97 kg)   SpO2 92%   BMI 31.58 kg/m     Last documented pain score (0-10 scale): Pain Level: 0  Last Weight:   Wt Readings from Last 1 Encounters:   12/27/16 213 lb 13.5 oz (97 kg)     Mental Status:  oriented, alert and coherent     IV Access:  - None    Nursing Mobility/ADLs:  Walking   Independent  Transfer  Independent  Bathing  Assisted  Dressing  Assisted  Toileting  Assisted  Feeding  Assisted  Med Admin  Assisted  Med Delivery   whole    Wound Care Documentation and Therapy:        Elimination:  Continence:    Bowel: Yes   Bladder: Yes  Urinary Catheter: None   Colostomy/Ileostomy/Ileal Conduit: No       Date of Last BM: 12/30/16  No intake or output data in the 24 hours ending 12/29/16 1138  No intake/output data recorded.    Safety Concerns:  At Risk for Falls    Impairments/Disabilities:      {MH COC Impairments/Disabilities:[id number]}    Nutrition Therapy:  Current Nutrition Therapy:   general    Routes of Feeding: Oral  Liquids: No Restrictions  Daily Fluid Restriction: no  Last Modified Barium Swallow with Video (Video Swallowing Test): not done    Treatments at the Time of Hospital Discharge:   Respiratory Treatments: Home O2 continously  Oxygen Therapy:  is not on home oxygen therapy.  Ventilator:    - No ventilator support    Rehab Therapies: Physical Therapy and Occupational Therapy  Weight Bearing Status/Restrictions: No weight bearing restirctions  Other Medical Equipment (for information only, NOT a DME order):  walker and neublizer  Other Treatments: ***    Patient's personal belongings (please select all that are sent with patient):  {CHP DME Belongings:[id number]}    RN SIGNATURE:  Electronically signed by Mikel Cella, RN on 12/31/16 at 10:27 AM    CASE MANAGEMENT/SOCIAL WORK SECTION    Inpatient Status Date: ***    Readmission Risk Assessment Score:  Readmission Risk              Risk of Unplanned Readmission:        16             Discharging to Facility/ Agency    Name:   [facility]   Address:   Phone: [phone]   Fax: [phone]    Dialysis Facility (if applicable)    Name:   Address:   Dialysis Schedule:   Phone:   Fax:    Case Manager/Social Worker signature: {Esignature:[id number]}    PHYSICIAN SECTION    Prognosis: Fair    Condition at Discharge: Stable    Rehab Potential (if transferring to Rehab): Fair    Recommended Labs or Other Treatments After Discharge:     Physician Certification: I certify the above information and transfer of Shannon Allison  is necessary for the continuing treatment of the diagnosis listed and that she requires Home Care for greater 30 days.     Update Admission H&P: The patient came in with COPD exacerbation due to smoking. He was managed by levofloxacin, deltasone and RT therapy. Patient has a history of depression, parkinsonism, DM Type 2 and Hypertension. Pt has been discharged with home oxygen and nebulizer    PHYSICIAN SIGNATURE:  Electronically signed by Mikey Kirschner, MD on 12/31/16 at 11:55 AM

## 2016-12-31 NOTE — Care Coordination-Inpatient (Signed)
Transtion Planning:     [facility] delivered Rolling walker and nebulizer to pt room, pt now does not meet criteria for home oxygen after AM testing. Portable tank not delivered.     Faxed updated  home oxygen eval to [facility] at [phone].

## 2016-12-31 NOTE — Progress Notes (Signed)
[facility]     Department of Internal Medicine - Staff Internal Medicine Service     DAILY PROGRESS NOTE       Patient:  Shannon Allison  Date of Birth: [birth year]  MRN: [id number]     Acct: [id number]     Admit date: 12/26/2016  Admitting Diagnosis: Asthma exacerbation, mild    Subjective:   Patient seen and examined at bedside.   No acute events overnight.  Alert and oriented. Stable vitally.  On O2 via nasal canula  Ambulated to the rest room herself  Tolerating food fine  Patient will be discharged home today     Review of Systems   Constitutional: Negative for chills, fever, malaise/fatigue and weight loss.   Respiratory: Positive for wheezing. Negative for cough, hemoptysis and sputum production.    Cardiovascular: Negative for chest pain, palpitations, orthopnea and claudication.   Gastrointestinal: Negative for abdominal pain, diarrhea, heartburn, nausea and vomiting.   Genitourinary: Negative for dysuria, frequency, hematuria and urgency.   Musculoskeletal: Negative for back pain, joint pain, myalgias and neck pain.   Skin: Negative for itching and rash.     Objective:   BP 136/63   Pulse 88   Temp 97.8 F (36.6 C) (Oral)   Resp 18   Ht  (1.753 m)   Wt 213 lb 13.5 oz (97 kg)   SpO2 99%   BMI 31.58 kg/m       General appearance - alert, well appearing, and in no distress  Mental status - alert, oriented to person, place, and time  Eyes - pupils equal and reactive, extraocular eye movements intact  Mouth - mucous membranes moist, pharynx normal without lesions  Chest - bilateral wheezes in the lungs  Heart - normal rate, regular rhythm, normal S1, S2, no murmurs, rubs, clicks or gallops  Abdomen - soft, nontender, nondistended, no masses or organomegaly  Neurological - alert, oriented, normal speech, no focal findings or movement disorder noted  Musculoskeletal - no joint tenderness, deformity or swelling  Extremities - peripheral pulses normal, no pedal edema, no  clubbing or cyanosis  Skin - normal coloration and turgor, no rashes, no suspicious skin lesions noted      Intake/Output Summary (Last 24 hours) at 12/31/16 1205  Last data filed at 12/30/16 1357   Gross per 24 hour   Intake              360 ml   Output                0 ml   Net              360 ml         Medications:     . QUEtiapine  100 mg Oral Nightly   . ipratropium-albuterol  1 ampule Inhalation 4x daily   . nicotine  1 patch Transdermal Daily   . desvenlafaxine succinate  100 mg Oral Daily   . atorvastatin  20 mg Oral Nightly   . fluticasone  1 spray Nasal Daily   . folic acid  1 mg Oral Daily   . lisinopril  5 mg Oral Daily   . metFORMIN  500 mg Oral BID WC   . montelukast  10 mg Oral Nightly   . rOPINIRole  3 mg Oral TID   . sodium chloride flush  10 mL Intravenous 2 times per day   . enoxaparin  40 mg Subcutaneous Daily   .  sennosides-docusate sodium  1 tablet Oral Daily   . pantoprazole  40 mg Oral QAM AC       Diagnostic Labs and Imaging    CBC: Recent Labs      12/30/16   0619   WBC  9.4   RBC  4.28   HGB  12.2   HCT  38.9   MCV  90.9   RDW  13.2   PLT  266     BMP: Recent Labs      12/30/16   0619   NA  139   K  4.1   CL  99   CO2  27   BUN  16   CREATININE  0.53     Assessment and Plan:   Principal Problem:    Asthma exacerbation, mild  Active Problems:    Essential hypertension    Dyslipidemia    Parkinson's disease (HCC)    Type 2 diabetes mellitus, without long-term current use of insulin (HCC)    Gastroesophageal reflux disease without esophagitis  Resolved Problems:    * No resolved hospital problems. *    1. Acute Exacerbation of COPD associated with asthma and cigarette smoking  - On O2 via nasal canula   - On Duoneb Nebulizer 1 ampule 4 times daily  - On Proventil 2.5 mg, albuterol sulphate inhaler  - On Levofloxacin 500 mg (Last day)  - On Montelukast 10 mg nightly  - On Deltasone tablet 40 mg for 5 days (Last  day)  - Will continue to observe    2. Essential HTN  - On Lisinopril 5mg     3. DM Type 2  - On Metformin 500 mg BD    4. Hyperlipidemia  - On Atorvastatin 20 mg    5. Parkinsonism  - On Requip 3 mg  - On Seroquel 100 mg  - On Prestiq 100 mg    6. DVT Prophylaxis  - On Lovenox 40 mg SQ    7. Diet  - General Diet      Disposition - Return to group home. Patient will be discharged today.      Laray Anger, MD  PGY-1, Department of Internal Medicine  [facility], [place], [place]  12/31/2016 12:05 PM    Attending Physician Statement    Active Hospital Problems    Diagnosis Date Noted   . Asthma exacerbation, mild [J45.901] 12/26/2016   . Type 2 diabetes mellitus, without long-term current use of insulin (HCC) [E11.9] 12/26/2016   . Gastroesophageal reflux disease without esophagitis [K21.9] 12/26/2016   . Parkinson's disease (HCC) [G20] 04/24/2016   . Essential hypertension [I10] 02/28/2016   . Dyslipidemia [E78.5] 02/28/2016       I have discussed the case, including pertinent history and exam findings with the resident and the team.  I have seen and examined the patient and the key elements of the encounter have been performed by me.  I agree with the assessment, plan and orders as documented by the resident.      Cindee Lame, MD, [facility] Health Physician - [facility] & [facility]  Associate Program Director Department of Internal Medicine  Internal Medicine Clerkship Site Director- [facility]  12/31/2016, 9:12 PM

## 2016-12-31 NOTE — Plan of Care (Signed)
Problem: RESPIRATORY  Intervention: Respiratory assessment  BRONCHOSPASM/BRONCHOCONSTRICTION     [x]         IMPROVE AERATION/BREATH SOUNDS  [x]   ADMINISTER BRONCHODILATOR THERAPY AS APPROPRIATE  [x]   ASSESS BREATH SOUNDS  [x]   IMPLEMENT AEROSOL/MDI PROTOCOL  [x]   PATIENT EDUCATION AS NEEDED

## 2016-12-31 NOTE — Progress Notes (Signed)
Home oxygen eval was repeated. Patient does not qualify for O2 at this time.

## 2017-01-03 ENCOUNTER — Ambulatory Visit: Admit: 2017-01-03 | Discharge: 2017-01-03 | Payer: MEDICAID | Attending: Adult Health | Primary: Nurse Practitioner

## 2017-01-03 DIAGNOSIS — G20 Parkinson's disease: Secondary | ICD-10-CM

## 2017-01-03 MED ORDER — ROPINIROLE HCL 3 MG PO TABS
3 | ORAL_TABLET | Freq: Three times a day (TID) | ORAL | 5 refills | Status: DC
Start: 2017-01-03 — End: 2017-06-27

## 2017-01-03 MED ORDER — ROPINIROLE HCL 1 MG PO TABS
1 | ORAL_TABLET | ORAL | 5 refills | Status: DC
Start: 2017-01-03 — End: 2017-06-27

## 2017-01-03 NOTE — Progress Notes (Signed)
[facility]            [address], [address]          [place], [place] [address]          Dept: [phone]          Dept Fax: [phone]        E. Roanna Epley, MD           Jacqlyn Krauss, MD         Ahmed B. Georgetta Haber, MD                   Magda Bernheim, MD              Magda Paganini, MD                Darcella Cheshire, CNP           01/03/2017    HPI:      Your patient, Shannon Allison returns for continuing neurologic care.  Patient is a 56 year old woman who was seen last on September 28, 2016 for evaluation of Parkinson's disease.  Patient was initially seen in November 2017 and has a history of diabetes mellitus type 2, hypertension, dyslipidemia, schizophrenia, and bipolar disorder.  Patient currently lives in a group home because of her schizophrenia.  Patient comes to her appointments accompanied by a case manager.  Patient has tremor mainly in her left hand more than her right which is at rest and during ambulation.  She has a shuffling gait, masklike face, cogwheel rigidity, and bradykinesia.  There is no postural instability.  Patient had a DAT scan which also had decreased uptake in her left putamen compared to the right.  She has been titrated on to Requip which is now at 3 mg 3 times daily.  At her last visit, the patient had had more energy, less tremor and was tolerating the medication without side effects. On today's visit,she is having increased tremor especially in her left hand as well as an impaired gait.  We discussed medication options and delaying the use of carbidopa levodopa because of her age. Patient is quite frustrated with her tremor.  She denies any side effects to the medication.  Patient was recently hospitalized with an exacerbation of asthma.  Patient denies headaches, blurred or double vision, weakness numbness or tingling in her extremities.                  Past Medical History:   Diagnosis Date   . Asthma    . Diabetes mellitus (HCC)     . Pneumonia 12/2016         Past Surgical History:   Procedure Laterality Date   . NASAL SEPTUM SURGERY Bilateral    . PR COLON CA SCRN NOT HI RSK IND  01/04/2016    COLONOSCOPY performed by Link Snuffer, MD at [facility]   . PR COLON CA SCRN NOT HI RSK IND N/A 02/10/2016    COLONOSCOPY; incomplete; aborted due to poor prep performed by Link Snuffer, MD at [facility]   . PR ESOPHAGOGASTRODUODENOSCOPY TRANSORAL DIAGNOSTIC N/A 01/04/2016    EGD ESOPHAGOGASTRODUODENOSCOPY performed by Link Snuffer, MD at [facility]       Family History   Problem Relation Age of Onset   . High Blood Pressure Mother    . High Blood Pressure Father        Social History  Substance Use Topics   . Smoking status: Current Every Day Smoker     Packs/day: 0.25   . Smokeless tobacco: Never Used   . Alcohol use No                               REVIEW OF SYSTEMS    CONSTITUTIONAL Weight: absent, Appetite: absent, Fatigue: absent      HEENT Ears: normal, Visual disturbance: absent   RESPIRATORY Shortness of breath: absent, Cough: absent   CARDIOVASCULAR Chest pain: absent, Leg swelling :absent      GI Constipation: absent, Diarrhea: absent, Swallowing change: absent      GU Urinary frequency: absent, Urinary urgency: absent, Urinary incontinence: absent   MUSCULOSKELETAL Neck pain: absent, Back pain: absent, Stiffness: absent, Muscle pain: absent, Joint pain: absent Restless legs: absent   DERMATOLOGIC Hair loss: absent, Skin changes: absent   NEUROLOGIC Memory loss: absent, Confusion: absent, Seizures: absent Trouble walking or imbalance: absent, Dizziness: absent, Weakness: absent, Numbness: absent Tremor: present, Spasm: absent, Speech difficulty: absent, Headache: absent, Light sensitivity: absent   PSYCHIATRIC Anxiety: absent, Hallucination: absent, Mood disorder: absent   HEMATOLOGIC Abnormal bleeding: absent, Anemia: absent, Clotting disorder: absent, Lymph gland changes: absent           No Known  Allergies        Current Outpatient Prescriptions   Medication Sig Dispense Refill   . rOPINIRole (REQUIP) 3 MG tablet Take 1 tablet by mouth 3 times daily 90 tablet 5   . rOPINIRole (REQUIP) 1 MG tablet Give 1 mg with morning dosage of 3 mg for 1 week, then morning and noon dose for one week then 1 mg TID in addition to the 3 mg tablet 90 tablet 5   . acetaminophen (TYLENOL) 325 MG tablet Take 2 tablets by mouth every 4 hours as needed for Pain or Fever 30 tablet 0   . hydrOXYzine (VISTARIL) 25 MG capsule Take 1 capsule by mouth 3 times daily as needed for Itching 90 capsule 2   . LORazepam (ATIVAN) 1 MG tablet Take 1 tablet by mouth every 8 hours as needed for Anxiety for up to 5 days.. 15 tablet 0   . albuterol (PROVENTIL) (2.5 MG/3ML) 0.083% nebulizer solution Take 3 mLs by nebulization every 4 hours as needed for Wheezing or Shortness of Breath 120 mL 3   . ipratropium-albuterol (DUONEB) 0.5-2.5 (3) MG/3ML SOLN nebulizer solution Inhale 3 mLs into the lungs every 4 hours as needed for Shortness of Breath 360 mL 3   . montelukast (SINGULAIR) 10 MG tablet Take 1 tablet by mouth nightly 30 tablet 3   . tiotropium (SPIRIVA HANDIHALER) 18 MCG inhalation capsule Inhale 1 capsule into the lungs daily 30 capsule 3   . desvenlafaxine succinate (PRISTIQ) 100 MG TB24 extended release tablet Take 1 tablet by mouth daily 30 tablet 3   . metFORMIN (GLUCOPHAGE) 500 MG tablet Take 1 tablet by mouth 2 times daily (with meals) 60 tablet 3   . atorvastatin (LIPITOR) 20 MG tablet Take 1 tablet by mouth daily 30 tablet 3   . lisinopril (PRINIVIL;ZESTRIL) 5 MG tablet Take 1 tablet by mouth daily 30 tablet 3   . QUEtiapine (SEROQUEL) 100 MG tablet Take 1 tablet by mouth nightly 60 tablet 3   . fluticasone (FLONASE) 50 MCG/ACT nasal spray 1 spray by Nasal route daily 1 Bottle 3   . folic acid (FOLVITE) 1 MG tablet  Take 1 tablet by mouth daily 30 tablet 3   . magnesium hydroxide (MILK OF MAGNESIA) 400 MG/5ML suspension Take 30 mLs by  mouth daily as needed for Constipation     . sennosides-docusate sodium (SENOKOT-S) 8.6-50 MG tablet Take 1 tablet by mouth daily 30 tablet 2   . nicotine (NICODERM CQ) 21 MG/24HR Place 1 patch onto the skin daily 30 patch 3   . esomeprazole Magnesium (NEXIUM) 40 MG PACK Take 1 packet by mouth daily 30 packet 3   . paliperidone palmitate (INVEGA SUSTENNA) 234 MG/1.5ML SUSP IM injection Inject 234 mg into the muscle every 30 days       No current facility-administered medications for this visit.                                         PHYSICAL EXAMINATION       Ht  (1.753 m)   Wt 211 lb 12.8 oz (96.1 kg)   BMI 31.28 kg/m                                             .                                                                                                      General Appearance:  Alert, cooperative, no signs of distress, appears stated age, mask-like facial appearance   Head:  Normocephalic, no signs of trauma   Eyes:  Conjunctiva/corneas clear;  eyelids intact   Ears:  Normal external ear and canals   Nose: Nares normal, mucosa normal, no drainage    Throat: Lips and tongue normal; teeth normal;  gums normal   Neck: Supple, intact flexion, extension and rotation;   trachea midline;  no adenopathy;   thyroid: not enlarged;   no carotid pulse abnormality   Back:   Symmetric, no curvature, ROM adequate   Lungs:   Respirations unlabored   Heart:  Regular rate and rhythm           Extremities: Extremities normal, no cyanosis, no edema   Pulses: Symmetric over head and neck   Skin: Skin color, texture normal, no rashes, no lesions                                             NEUROLOGIC EXAMINATION    Neurologic Exam     Mental Status   Oriented to person, place, and time.   Attention: normal.   Speech: speech is normal   Level of consciousness: alert  Normal comprehension.     Cranial Nerves     CN II   Visual fields full to confrontation.     CN III, IV, VI   Pupils  are equal, round, and reactive to  light.  Extraocular motions are normal.     CN V   Facial sensation intact.     CN VII   Facial expression full, symmetric.     CN VIII   CN VIII normal.     CN IX, X   CN IX normal.     CN XI   CN XI normal.     CN XII   CN XII normal.     Motor Exam   Muscle bulk: normal  Overall muscle tone: normal  Right arm tone: normal  Left arm tone: normal and cogwheel rigidity  Right arm pronator drift: absent  Left arm pronator drift: absent  Right leg tone: normal  Left leg tone: normal    Strength   Strength 5/5 throughout.     Sensory Exam   Light touch normal.   Vibration normal.   Pinprick normal.     Gait, Coordination, and Reflexes     Gait  Gait: shuffling    Coordination   Finger to nose coordination: normal    Tremor   Resting tremor: present (More so on the left than the right)    Reflexes   Right brachioradialis: 2+  Left brachioradialis: 2+  Right biceps: 2+  Left biceps: 2+  Right triceps: 2+  Left triceps: 2+  Right patellar: 2+  Left patellar: 2+  Right achilles: 2+  Left achilles: 2+  Right plantar: normal  Left plantar: normal  Right ankle clonus: absent  Left ankle clonus: absent            ASSESSMENT/PLAN:       In summary, your patient, Shannon Allison exhibits the following, with associated plan:    1. Parkinson's disease, with positive DAT scan.  Patient has rest tremor, masklike face, bradykinesia, and shuffling gait  1. Increase Requip by 1 mg each week for a total of 4 mg 3 times daily  2. [facility] to [facility]  3. Referral to Dr. Jay Schlichter who is a movement disorder specialist.  Patient is unable to travel outside of the area because of her current situation in a group home and having no family in the area.  She saw Dr. Jay Schlichter once many years ago for evaluation of her tremor.  4. Patient will be monitored closely.  We will have her return in 3 weeks when her dosage of Requip is at 4 mg 3 times daily.            Signed: Darcella Cheshire, CNP      *Please note that portions of this  note were completed with a voice recognition program.  Although every effort was made to insure the accuracy of this automated transcription, some errors in transcription may have occurred, occasionally words and are mis-transcribed

## 2017-02-14 ENCOUNTER — Encounter: Attending: Adult Health | Primary: Nurse Practitioner

## 2017-04-19 ENCOUNTER — Encounter: Attending: Adult Health | Primary: Nurse Practitioner

## 2017-05-10 ENCOUNTER — Encounter: Attending: Adult Health | Primary: Nurse Practitioner

## 2017-05-17 ENCOUNTER — Encounter: Attending: Adult Health | Primary: Nurse Practitioner

## 2017-05-22 ENCOUNTER — Encounter: Attending: Adult Health | Primary: Nurse Practitioner

## 2017-06-04 ENCOUNTER — Encounter: Attending: Adult Health | Primary: Nurse Practitioner

## 2017-06-21 ENCOUNTER — Ambulatory Visit: Admit: 2017-06-21 | Discharge: 2017-06-21 | Payer: MEDICAID | Attending: Adult Health | Primary: Nurse Practitioner

## 2017-06-21 DIAGNOSIS — G20 Parkinson's disease: Secondary | ICD-10-CM

## 2017-06-21 MED ORDER — CARBIDOPA-LEVODOPA 25-100 MG PO TABS
25-100 MG | ORAL_TABLET | Freq: Three times a day (TID) | ORAL | 5 refills | Status: DC
Start: 2017-06-21 — End: 2017-09-20

## 2017-06-21 NOTE — Progress Notes (Signed)
[facility]            [address]          [place], [place] [address]          Dept: [phone]          Dept Fax: [phone]        E. Roanna Epley, MD           Jacqlyn Krauss, MD         Ahmed B. Georgetta Haber, MD                   Magda Bernheim, MD              Magda Paganini, MD                Darcella Cheshire, CNP           06/21/2017      HISTORY OF PRESENT ILLNESS:       I had the pleasure of seeing DOMINISHA KAMPMAN, who returns for continuing neurologic care.  Patient is a 57 year old woman who was initially seen in November 2017 for evaluation of tremor.  The patient has a history of diabetes mellitus type 2, hypertension, dyslipidemia, schizophrenia, and bipolar disorder.  She currently lives in a group home because of her schizophrenia.  Her last visit was on January 03, 2017.  The patient has tremor in her bilateral upper extremities more so on the left than the right, which is at rest and during ambulation.  She has a shuffling gait, masklike face, cogwheel rigidity, and bradykinesia.  There is no postural instability.  The patient has slowly been titrated on Requip to her current dosage of 4 mg 3 times a day.  She does notice slightly less tremor, however it is quite severe.  Patient had been referred to Dr. Jay Schlichter at the [facility] however when she contacted his office there was some confusion and she never got an appointment.  She was also referred to the BIG program designed for Parkinson's disease patients.  Because of her young age, we have delayed the use of carbidopa levodopa, however the patient is quite frustrated with her tremor.  Patient has canceled several appointments because of a death of her mother, death of her father, and illness.    Prior testing reviewed:    -DAT scan 03/24/16 showing decreased up take in her left putamen compared to the right which is consistent with Parkinson's disease  -MRI brain with and  without contrast 03/24/16 normal          PAST MEDICAL HISTORY:         Diagnosis Date   . Asthma    . Diabetes mellitus (HCC)    . Pneumonia 12/2016        PAST SURGICAL HISTORY:         Procedure Laterality Date   . NASAL SEPTUM SURGERY Bilateral    . PR COLON CA SCRN NOT HI RSK IND  01/04/2016    COLONOSCOPY performed by Link Snuffer, MD at [facility]   . PR COLON CA SCRN NOT HI RSK IND N/A 02/10/2016    COLONOSCOPY; incomplete; aborted due to poor prep performed by Link Snuffer, MD at [facility]   . PR ESOPHAGOGASTRODUODENOSCOPY TRANSORAL DIAGNOSTIC N/A 01/04/2016    EGD ESOPHAGOGASTRODUODENOSCOPY performed by Link Snuffer, MD at [facility]  SOCIAL HISTORY:     Social History     Socioeconomic History   . Marital status: Single     Spouse name: Not on file   . Number of children: Not on file   . Years of education: Not on file   . Highest education level: Not on file   Occupational History   . Not on file   Social Needs   . Financial resource strain: Not on file   . Food insecurity:     Worry: Not on file     Inability: Not on file   . Transportation needs:     Medical: Not on file     Non-medical: Not on file   Tobacco Use   . Smoking status: Current Every Day Smoker     Packs/day: 0.25   . Smokeless tobacco: Never Used   Substance and Sexual Activity   . Alcohol use: No   . Drug use: No   . Sexual activity: Not on file   Lifestyle   . Physical activity:     Days per week: Not on file     Minutes per session: Not on file   . Stress: Not on file   Relationships   . Social connections:     Talks on phone: Not on file     Gets together: Not on file     Attends religious service: Not on file     Active member of club or organization: Not on file     Attends meetings of clubs or organizations: Not on file     Relationship status: Not on file   . Intimate partner violence:     Fear of current or ex partner: Not on file     Emotionally abused: Not on file     Physically abused:  Not on file     Forced sexual activity: Not on file   Other Topics Concern   . Not on file   Social History Narrative   . Not on file       CURRENT MEDICATIONS:     Current Outpatient Medications   Medication Sig Dispense Refill   . carbidopa-levodopa (SINEMET) 25-100 MG per tablet Take 1 tablet by mouth 3 times daily 90 tablet 5   . rOPINIRole (REQUIP) 3 MG tablet Take 1 tablet by mouth 3 times daily 90 tablet 5   . rOPINIRole (REQUIP) 1 MG tablet Give 1 mg with morning dosage of 3 mg for 1 week, then morning and noon dose for one week then 1 mg TID in addition to the 3 mg tablet 90 tablet 5   . acetaminophen (TYLENOL) 325 MG tablet Take 2 tablets by mouth every 4 hours as needed for Pain or Fever 30 tablet 0   . hydrOXYzine (VISTARIL) 25 MG capsule Take 1 capsule by mouth 3 times daily as needed for Itching 90 capsule 2   . albuterol (PROVENTIL) (2.5 MG/3ML) 0.083% nebulizer solution Take 3 mLs by nebulization every 4 hours as needed for Wheezing or Shortness of Breath 120 mL 3   . ipratropium-albuterol (DUONEB) 0.5-2.5 (3) MG/3ML SOLN nebulizer solution Inhale 3 mLs into the lungs every 4 hours as needed for Shortness of Breath 360 mL 3   . montelukast (SINGULAIR) 10 MG tablet Take 1 tablet by mouth nightly 30 tablet 3   . tiotropium (SPIRIVA HANDIHALER) 18 MCG inhalation capsule Inhale 1 capsule into the lungs daily 30 capsule 3   . desvenlafaxine succinate (  PRISTIQ) 100 MG TB24 extended release tablet Take 1 tablet by mouth daily 30 tablet 3   . metFORMIN (GLUCOPHAGE) 500 MG tablet Take 1 tablet by mouth 2 times daily (with meals) 60 tablet 3   . atorvastatin (LIPITOR) 20 MG tablet Take 1 tablet by mouth daily 30 tablet 3   . lisinopril (PRINIVIL;ZESTRIL) 5 MG tablet Take 1 tablet by mouth daily 30 tablet 3   . QUEtiapine (SEROQUEL) 100 MG tablet Take 1 tablet by mouth nightly 60 tablet 3   . fluticasone (FLONASE) 50 MCG/ACT nasal spray 1 spray by Nasal route daily 1 Bottle 3   . folic acid (FOLVITE) 1 MG  tablet Take 1 tablet by mouth daily 30 tablet 3   . magnesium hydroxide (MILK OF MAGNESIA) 400 MG/5ML suspension Take 30 mLs by mouth daily as needed for Constipation     . sennosides-docusate sodium (SENOKOT-S) 8.6-50 MG tablet Take 1 tablet by mouth daily 30 tablet 2   . esomeprazole Magnesium (NEXIUM) 40 MG PACK Take 1 packet by mouth daily 30 packet 3   . paliperidone palmitate (INVEGA SUSTENNA) 234 MG/1.5ML SUSP IM injection Inject 234 mg into the muscle every 30 days       No current facility-administered medications for this visit.         ALLERGIES:   No Known Allergies                                                REVIEW OF SYSTEMS    CONSTITUTIONAL Weight: present, Appetite: absent, Fatigue: absent      HEENT Ears: normal, Visual disturbance: absent   RESPIRATORY Shortness of breath: absent, Cough: absent   CARDIOVASCULAR Chest pain: absent, Leg swelling :absent      GI Constipation: present, Diarrhea: absent, Swallowing change: absent      GU Urinary frequency: present, Urinary urgency: present, Urinary incontinence: absent   MUSCULOSKELETAL Neck pain: absent, Back pain: absent, Stiffness: absent, Muscle pain: absent, Joint pain: absent Restless legs: absent   DERMATOLOGIC Hair loss: absent, Skin changes: absent   NEUROLOGIC Memory loss: absent, Confusion: absent, Seizures: absent Trouble walking or imbalance: absent, Dizziness: absent, Weakness: absent, Numbness: absent Tremor: present, Spasm: absent, Speech difficulty: absent, Headache: present, Light sensitivity: absent   PSYCHIATRIC Anxiety: absent, Hallucination: absent, Mood disorder: absent   HEMATOLOGIC Abnormal bleeding: absent, Anemia: absent, Clotting disorder: absent, Lymph gland changes: absent           PHYSICAL EXAMINATION:       Vitals:                                              .  General Appearance:  Alert, cooperative, no signs of  distress, appears stated age   Head:  Normocephalic, no signs of trauma   Eyes:  Conjunctiva/corneas clear;  eyelids intact   Ears:  Normal external ear and canals   Nose: Nares normal, mucosa normal, no drainage    Throat: Lips and tongue normal; teeth normal;  gums normal   Neck: Supple, intact flexion, extension and rotation;   trachea midline;  no adenopathy;   thyroid: not enlarged;   no carotid pulse abnormality   Back:   Symmetric, no curvature, ROM adequate   Lungs:   Respirations unlabored   Heart:  Regular rate and rhythm           Extremities: Extremities normal, no cyanosis, no edema   Pulses: Symmetric over head and neck   Skin: Skin color, texture normal, no rashes, no lesions                                     NEUROLOGIC EXAMINATION    Neurologic Exam  Mental status    Alert and oriented x 3; intact memory with no confusion, speech or language problems; no hallucinations or delusions  Fund of information appropriate for level of education    Cranial nerves    II - visual fields intact to confrontation  III, IV, VI - extra-ocular muscles full: no pupillary defect; no INO, no nystagmus, no ptosis   V - normal facial sensation                                                               VII - normal facial symmetry                                                             VIII - intact hearing                                                                             IX, X - symmetrical palate                                                                  XI - symmetrical shoulder shrug  XII - tongue midline without atrophy or fasciculation      Motor function  Normal muscle bulk and tone; strength 5/5 on all 4 extremities, no pronator drift      Sensory function Intact to light touch, pinprick, vibration, proprioception on all 4 extremities      Cerebellar Intact fine motor movement.patient has bilateral tremor at rest and during ambulation.   There is cogwheel rigidity bilaterally, bradykinesia, and shuffling gait    Reflex function DTR 2+ on bilateral UE and LE, symmetric. Negative Babinski      Gait                   shuffling and impulsive gait                  ASSESSMENT AND PLAN:           In summary, your patient, MEAGON DEGOLIER exhibits the following, with associated plan:    1. Parkinson's disease, with positive DAT scan.  1. Continue Requip 4 mg 3 times daily  2. Add Sinemet 25/100 mg 3 times daily.  Patient was advised to take 1 tablet daily for 1 week then increase to 1 tablet twice daily for 1 week, then increase to 1 tablet 3 times daily.  3. Patient will have another referral to Dr. Jay Schlichter  4. Patient will also be referred to the BIG program for physical therapy  5. She will return in 2 months for reevaluation after adding Sinemet            Signed: Darcella Cheshire, CNP      *Please note that portions of this note were completed with a voice recognition program.  Although every effort was made to insure the accuracy of this automated transcription, some errors in transcription may have occurred, occasionally words and are mis-transcribed

## 2017-06-27 MED ORDER — ROPINIROLE HCL 3 MG PO TABS
3 MG | ORAL_TABLET | Freq: Three times a day (TID) | ORAL | 5 refills | Status: DC
Start: 2017-06-27 — End: 2017-09-20

## 2017-06-27 MED ORDER — ROPINIROLE HCL 1 MG PO TABS
1 MG | ORAL_TABLET | ORAL | 5 refills | Status: DC
Start: 2017-06-27 — End: 2017-09-20

## 2017-06-27 NOTE — Telephone Encounter (Signed)
Anjela's next appointment is 08/30/2017.

## 2017-08-30 ENCOUNTER — Encounter: Attending: Adult Health | Primary: Nurse Practitioner

## 2017-08-31 LAB — HEMOGLOBIN A1C
Estimated Avg Glucose: 120 mg/dL
Hemoglobin A1C: 5.8 % (ref 4.0–6.0)

## 2017-08-31 LAB — CBC WITH AUTO DIFFERENTIAL
Basophils %: 0 % (ref 0–2)
Basophils Absolute: <0.03 10*3/uL (ref 0.00–0.20)
Eosinophils %: 1 % (ref 1–4)
Eosinophils Absolute: 0.05 10*3/uL (ref 0.00–0.44)
Hematocrit: 43.6 % (ref 36.3–47.1)
Hemoglobin: 13.2 g/dL (ref 11.9–15.1)
Immature Granulocytes %: 0 %
Immature Granulocytes Absolute: 0.03 10*3/uL (ref 0.00–0.30)
Lymphocytes Absolute: 1.37 10*3/uL (ref 1.10–3.70)
Lymphocytes: 20 % — ABNORMAL LOW (ref 24–43)
MCH: 28.1 pg (ref 25.2–33.5)
MCHC: 30.3 g/dL (ref 28.4–34.8)
MCV: 93 fL (ref 82.6–102.9)
MPV: 11.6 fL (ref 8.1–13.5)
Monocytes %: 8 % (ref 3–12)
Monocytes Absolute: 0.53 10*3/uL (ref 0.10–1.20)
NRBC Automated: 0 per 100 WBC
Neutrophils Absolute: 4.75 10*3/uL (ref 1.50–8.10)
Platelets: 247 10*3/uL (ref 138–453)
RBC: 4.69 m/uL (ref 3.95–5.11)
RDW: 13.2 % (ref 11.8–14.4)
Seg Neutrophils: 71 % — ABNORMAL HIGH (ref 36–65)
WBC: 6.8 10*3/uL (ref 3.5–11.3)

## 2017-08-31 LAB — COMPREHENSIVE METABOLIC PANEL, FASTING
ALT: <5 U/L — ABNORMAL LOW (ref 5–33)
AST: 11 U/L (ref <32)
Albumin/Globulin Ratio: 1.4 (ref 1.0–2.5)
Albumin: 4.2 g/dL (ref 3.5–5.2)
Alkaline Phosphatase: 80 U/L (ref 35–104)
Anion Gap: 12 mmol/L (ref 9–17)
BUN: 11 mg/dL (ref 6–20)
CO2: 30 mmol/L (ref 20–31)
Calcium: 9.5 mg/dL (ref 8.6–10.4)
Chloride: 99 mmol/L (ref 98–107)
Creatinine: 0.49 mg/dL — ABNORMAL LOW (ref 0.50–0.90)
GFR African American: >60 mL/min (ref >60)
GFR Non-African American: >60 mL/min (ref >60)
Glucose, Fasting: 108 mg/dL — ABNORMAL HIGH (ref 70–99)
Potassium: 4.2 mmol/L (ref 3.7–5.3)
Sodium: 141 mmol/L (ref 135–144)
Total Bilirubin: 0.33 mg/dL (ref 0.3–1.2)
Total Protein: 7.3 g/dL (ref 6.4–8.3)

## 2017-08-31 LAB — LIPID, FASTING
Chol/HDL Ratio: 3.2 (ref <5)
Cholesterol, Fasting: 146 mg/dL (ref <200)
HDL: 46 mg/dL (ref >40)
LDL Cholesterol: 70 mg/dL (ref 0–130)
Triglyceride, Fasting: 150 mg/dL — ABNORMAL HIGH (ref <150)

## 2017-09-20 MED ORDER — ROPINIROLE HCL 4 MG PO TABS
4 MG | ORAL_TABLET | Freq: Three times a day (TID) | ORAL | 5 refills | Status: DC
Start: 2017-09-20 — End: 2017-11-17

## 2017-09-20 MED ORDER — CARBIDOPA-LEVODOPA 25-100 MG PO TABS
25-100 MG | ORAL_TABLET | Freq: Three times a day (TID) | ORAL | 5 refills | Status: DC
Start: 2017-09-20 — End: 2017-11-17

## 2017-09-20 NOTE — Progress Notes (Signed)
[facility]            [address]          [place], [place] [address]          Dept: [phone]          Dept Fax: [phone]        E. Roanna Epley, MD           Jacqlyn Krauss, MD         Ahmed B. Georgetta Haber, MD                   Magda Bernheim, MD              Magda Paganini, MD                Darcella Cheshire, CNP            09/20/2017      HISTORY OF PRESENT ILLNESS:       I had the pleasure of seeing Shannon Allison, who returns for continuing neurologic care.  Patient is a 57 year old woman who was seen last on June 21, 2017 for evaluation of Parkinson's disease.  The patient was initially seen in November 2017 for evaluation of tremor.  She has a history of diabetes mellitus type 2, hypertension, dyslipidemia, schizophrenia, and bipolar disorder.  She currently lives in a group home because of her schizophrenia.  In addition to her tremor which is primarily on the left at rest she has a shuffling gait, mask-like facies, cogwheel rigidity, and bradykinesia.  She has no postural instability.  She was titrated on Requip to 4 mg 3 times daily and because of her frustrations, she was placed on carbidopa levodopa 25/100 mg 3 times daily at her last visit despite her age of 7.  She was referred to Dr. Jay Schlichter at the [facility], however she has not yet gotten an appointment.  She also was referred to the BIG program designed for Parkinson's disease patients.  Patient is here today for reevaluation and has noticed an improvement in her symptoms since the addition of carbidopa levodopa as well as physical therapy.  She is walking better and has less tremor bilaterally.  She has no ill effects from the medication.  She has an appointment with Dr. Jay Schlichter in August 2019      Prior testing reviewed:    -DAT scan 03/24/16 showing decreased up take in her left putamen compared to the right which is consistent with Parkinson's disease  -MRI brain with  and without contrast 03/24/16 normal          PAST MEDICAL HISTORY:         Diagnosis Date   ??? Asthma    ??? Diabetes mellitus (HCC)    ??? Pneumonia 12/2016        PAST SURGICAL HISTORY:         Procedure Laterality Date   ??? NASAL SEPTUM SURGERY Bilateral    ??? PR COLON CA SCRN NOT HI RSK IND  01/04/2016    COLONOSCOPY performed by Link Snuffer, MD at [facility]   ??? PR COLON CA SCRN NOT HI RSK IND N/A 02/10/2016    COLONOSCOPY; incomplete; aborted due to poor prep performed by Link Snuffer, MD at [facility]   ??? PR ESOPHAGOGASTRODUODENOSCOPY TRANSORAL DIAGNOSTIC N/A 01/04/2016    EGD ESOPHAGOGASTRODUODENOSCOPY performed by Link Snuffer, MD at  [facility]        SOCIAL HISTORY:     Social History     Socioeconomic History   ??? Marital status: Single     Spouse name: Not on file   ??? Number of children: Not on file   ??? Years of education: Not on file   ??? Highest education level: Not on file   Occupational History   ??? Not on file   Social Needs   ??? Financial resource strain: Not on file   ??? Food insecurity:     Worry: Not on file     Inability: Not on file   ??? Transportation needs:     Medical: Not on file     Non-medical: Not on file   Tobacco Use   ??? Smoking status: Current Every Day Smoker     Packs/day: 0.25   ??? Smokeless tobacco: Never Used   Substance and Sexual Activity   ??? Alcohol use: No   ??? Drug use: No   ??? Sexual activity: Not on file   Lifestyle   ??? Physical activity:     Days per week: Not on file     Minutes per session: Not on file   ??? Stress: Not on file   Relationships   ??? Social connections:     Talks on phone: Not on file     Gets together: Not on file     Attends religious service: Not on file     Active member of club or organization: Not on file     Attends meetings of clubs or organizations: Not on file     Relationship status: Not on file   ??? Intimate partner violence:     Fear of current or ex partner: Not on file     Emotionally abused: Not on file     Physically  abused: Not on file     Forced sexual activity: Not on file   Other Topics Concern   ??? Not on file   Social History Narrative   ??? Not on file       CURRENT MEDICATIONS:     Current Outpatient Medications   Medication Sig Dispense Refill   ??? rOPINIRole (REQUIP) 3 MG tablet TAKE 1 TABLET BY MOUTH 3 TIMES DAILY 90 tablet 5   ??? rOPINIRole (REQUIP) 1 MG tablet GIVE 1 MG WITH MORNING DOSAGE OF 3 MG FOR 1 WEEK, THEN MORNING AND NOON DOSE FOR ONE WEEK THEN 1 MG TID IN ADDITION TO THE 3 MG TABLET 90 tablet 5   ??? carbidopa-levodopa (SINEMET) 25-100 MG per tablet Take 1 tablet by mouth 3 times daily 90 tablet 5   ??? acetaminophen (TYLENOL) 325 MG tablet Take 2 tablets by mouth every 4 hours as needed for Pain or Fever 30 tablet 0   ??? hydrOXYzine (VISTARIL) 25 MG capsule Take 1 capsule by mouth 3 times daily as needed for Itching 90 capsule 2   ??? albuterol (PROVENTIL) (2.5 MG/3ML) 0.083% nebulizer solution Take 3 mLs by nebulization every 4 hours as needed for Wheezing or Shortness of Breath 120 mL 3   ??? ipratropium-albuterol (DUONEB) 0.5-2.5 (3) MG/3ML SOLN nebulizer solution Inhale 3 mLs into the lungs every 4 hours as needed for Shortness of Breath 360 mL 3   ??? montelukast (SINGULAIR) 10 MG tablet Take 1 tablet by mouth nightly 30 tablet 3   ??? tiotropium (SPIRIVA HANDIHALER) 18 MCG inhalation capsule Inhale 1 capsule into the lungs  daily 30 capsule 3   ??? desvenlafaxine succinate (PRISTIQ) 100 MG TB24 extended release tablet Take 1 tablet by mouth daily 30 tablet 3   ??? metFORMIN (GLUCOPHAGE) 500 MG tablet Take 1 tablet by mouth 2 times daily (with meals) 60 tablet 3   ??? atorvastatin (LIPITOR) 20 MG tablet Take 1 tablet by mouth daily 30 tablet 3   ??? lisinopril (PRINIVIL;ZESTRIL) 5 MG tablet Take 1 tablet by mouth daily 30 tablet 3   ??? QUEtiapine (SEROQUEL) 100 MG tablet Take 1 tablet by mouth nightly 60 tablet 3   ??? fluticasone (FLONASE) 50 MCG/ACT nasal spray 1 spray by Nasal route daily 1 Bottle 3   ??? folic acid (FOLVITE) 1  MG tablet Take 1 tablet by mouth daily 30 tablet 3   ??? magnesium hydroxide (MILK OF MAGNESIA) 400 MG/5ML suspension Take 30 mLs by mouth daily as needed for Constipation     ??? sennosides-docusate sodium (SENOKOT-S) 8.6-50 MG tablet Take 1 tablet by mouth daily 30 tablet 2   ??? esomeprazole Magnesium (NEXIUM) 40 MG PACK Take 1 packet by mouth daily 30 packet 3   ??? paliperidone palmitate (INVEGA SUSTENNA) 234 MG/1.5ML SUSP IM injection Inject 234 mg into the muscle every 30 days       No current facility-administered medications for this visit.         ALLERGIES:   No Known Allergies                              REVIEW OF SYSTEMS    CONSTITUTIONAL Weight: present, Appetite: absent, Fatigue: absent      HEENT Ears: normal, Visual disturbance: absent   RESPIRATORY Shortness of breath: absent, Cough: absent   CARDIOVASCULAR Chest pain: present, Leg swelling :absent      GI Constipation: absent, Diarrhea: absent, Swallowing change: absent      GU Urinary frequency: absent, Urinary urgency: absent,   MUSCULOSKELETAL Neck pain: absent, Back pain: absent, Stiffness: absent, Muscle pain: absent, Joint pain: absent Restless legs: absent   DERMATOLOGIC Hair loss: absent, Skin changes: absent   NEUROLOGIC Memory loss: absent, Confusion: absent, Seizures: absent Trouble walking or imbalance: absent, Dizziness: absent, Weakness: absent, Numbness: absent Tremor: absent, Spasm: absent, Speech difficulty: absent, Headache: absent, Light sensitivity: absent   PSYCHIATRIC Anxiety: absent, Hallucination: absent, Mood disorder: absent  Depression: present   HEMATOLOGIC Abnormal bleeding: absent, Anemia: absent,            PHYSICAL EXAMINATION:       Vitals:    09/20/17 1425   BP: 110/73   Pulse: 80                                              .                                                                                                    General Appearance:  Alert, cooperative, no signs of distress, appears stated age   Head:   Normocephalic, no signs of trauma   Eyes:  Conjunctiva/corneas clear;  eyelids intact   Ears:  Normal external ear and canals   Nose: Nares normal, mucosa normal, no drainage    Throat: Lips and tongue normal; teeth normal;  gums normal   Neck: Supple, intact flexion, extension and rotation;   trachea midline;  no adenopathy;   thyroid: not enlarged;   no carotid pulse abnormality   Back:   Symmetric, no curvature, ROM adequate   Lungs:   Respirations unlabored   Heart:  Regular rate and rhythm           Extremities: Extremities normal, no cyanosis, no edema   Pulses: Symmetric over head and neck   Skin: Skin color, texture normal, no rashes, no lesions                                     NEUROLOGIC EXAMINATION    Neurologic Exam  Mental status    Alert and oriented x 3; intact memory with no confusion, speech or language problems; no hallucinations or delusions  Fund of information appropriate for level of education Mask-like face   Cranial nerves    II - visual fields intact to confrontation bilaterally  III, IV, VI - extra-ocular muscles full: no pupillary defect; no INO, no nystagmus, no ptosis   V - normal facial sensation                                                               VII - normal facial symmetry                                                             VIII - intact hearing                                                                             IX, X - symmetrical palate                                                                  XI - symmetrical shoulder shrug  XII - tongue midline without atrophy or fasciculation      Motor function  Normal muscle bulk and tone; strength 5/5 on all 4 extremities, no pronator drift   + bradykinesia   Sensory function Intact to light touch, pinprick, vibration, proprioception on all 4 extremities      Cerebellar Intact fine motor movement. No involuntary movements or tremors. No ataxia or dysmetria  on finger to nose or heel to shin testing   +cogwheel rigidity bilaterally   Reflex function DTR 2+ on bilateral UE and LE, symmetric. Negative Babinski      Gait                   No shuffling, + tremor with ambulation                  ASSESSMENT AND PLAN:           In summary, your patient, SHANTESE RAVEN exhibits the following, with associated plan:    1. Parkinson's disease with positive DaTscan  1. Continue Requip 4 mg 3 times daily  2. Continue carbidopa levodopa 25/100 mg 3 times daily  3. Appointment with Dr. Jay Schlichter in August  4. Continue physical therapy  5. Speech therapy has been prescribed for her dysarthria, however she has not been able to attend as of yet  6. Patient will return in 3 months, unless she decides to become a patient of Dr. Jay Schlichter            Signed: Darcella Cheshire, CNP      *Please note that portions of this note were completed with a voice recognition program.  Although every effort was made to insure the accuracy of this automated transcription, some errors in transcription may have occurred, occasionally words and are mis-transcribed

## 2017-09-28 ENCOUNTER — Encounter

## 2017-09-28 ENCOUNTER — Inpatient Hospital Stay: Payer: MEDICAID | Primary: Nurse Practitioner

## 2017-09-28 ENCOUNTER — Inpatient Hospital Stay: Admit: 2017-09-28 | Payer: MEDICAID | Primary: Nurse Practitioner

## 2017-09-28 DIAGNOSIS — R0789 Other chest pain: Secondary | ICD-10-CM

## 2017-09-28 LAB — EKG 12-LEAD
Atrial Rate: 86 {beats}/min
P Axis: 73 degrees
P-R Interval: 144 ms
Q-T Interval: 396 ms
QRS Duration: 90 ms
QTc Calculation (Bazett): 473 ms
R Axis: 20 degrees
T Axis: 48 degrees
Ventricular Rate: 86 {beats}/min

## 2017-11-14 ENCOUNTER — Emergency Department: Admit: 2017-11-14 | Payer: MEDICAID | Primary: Nurse Practitioner

## 2017-11-14 ENCOUNTER — Inpatient Hospital Stay
Admission: EM | Admit: 2017-11-14 | Discharge: 2017-11-17 | Disposition: A | Discharge status: Home / Self Care | Payer: MEDICAID | Source: Other Acute Inpatient Hospital | Admitting: Internal Medicine

## 2017-11-14 DIAGNOSIS — R0789 Other chest pain: Secondary | ICD-10-CM

## 2017-11-14 LAB — CBC WITH AUTO DIFFERENTIAL
Basophils %: 0 % (ref 0–2)
Basophils Absolute: 0.03 10*3/uL (ref 0.00–0.20)
Eosinophils %: 1 % (ref 1–4)
Eosinophils Absolute: 0.11 10*3/uL (ref 0.00–0.44)
Hematocrit: 42.7 % (ref 36.3–47.1)
Hemoglobin: 13 g/dL (ref 11.9–15.1)
Immature Granulocytes %: 0 %
Immature Granulocytes Absolute: 0.03 10*3/uL (ref 0.00–0.30)
Lymphocytes Absolute: 1.62 10*3/uL (ref 1.10–3.70)
Lymphocytes: 19 % — ABNORMAL LOW (ref 24–43)
MCH: 27.8 pg (ref 25.2–33.5)
MCHC: 30.4 g/dL (ref 28.4–34.8)
MCV: 91.4 fL (ref 82.6–102.9)
MPV: 10 fL (ref 8.1–13.5)
Monocytes %: 12 % (ref 3–12)
Monocytes Absolute: 0.99 10*3/uL (ref 0.10–1.20)
NRBC Automated: 0 per 100 WBC
Neutrophils Absolute: 5.77 10*3/uL (ref 1.50–8.10)
Platelets: 313 10*3/uL (ref 138–453)
RBC: 4.67 m/uL (ref 3.95–5.11)
RDW: 13 % (ref 11.8–14.4)
Seg Neutrophils: 68 % — ABNORMAL HIGH (ref 36–65)
WBC: 8.6 10*3/uL (ref 3.5–11.3)

## 2017-11-14 LAB — TROPONIN
Troponin, High Sensitivity: <6 ng/L (ref 0–14)
Troponin, High Sensitivity: <6 ng/L (ref 0–14)
Troponin, High Sensitivity: <6 ng/L (ref 0–14)

## 2017-11-14 LAB — BASIC METABOLIC PANEL
Anion Gap: 12 mmol/L (ref 9–17)
BUN: 11 mg/dL (ref 6–20)
CO2: 27 mmol/L (ref 20–31)
Calcium: 9.3 mg/dL (ref 8.6–10.4)
Chloride: 103 mmol/L (ref 98–107)
Creatinine: 0.48 mg/dL — ABNORMAL LOW (ref 0.50–0.90)
GFR African American: >60 mL/min (ref >60)
GFR Non-African American: >60 mL/min (ref >60)
Glucose: 86 mg/dL (ref 70–99)
Potassium: 5.3 mmol/L (ref 3.7–5.3)
Sodium: 142 mmol/L (ref 135–144)

## 2017-11-14 MED ORDER — ASPIRIN 81 MG PO CHEW
81 MG | Freq: Once | ORAL | Status: DC
Start: 2017-11-14 — End: 2017-11-14

## 2017-11-14 MED ORDER — ASPIRIN 81 MG PO CHEW
81 MG | Freq: Once | ORAL | Status: AC
Start: 2017-11-14 — End: 2017-11-14
  Administered 2017-11-14: 324 mg via ORAL

## 2017-11-14 MED ORDER — MORPHINE SULFATE 4 MG/ML IJ SOLN
4 MG/ML | Freq: Once | INTRAMUSCULAR | Status: AC
Start: 2017-11-14 — End: 2017-11-14
  Administered 2017-11-14: 19:00:00 4 mg via INTRAVENOUS

## 2017-11-14 MED ORDER — MAGNESIUM SULFATE IN D5W 1-5 GM/100ML-% IV SOLN
1-5 GM/100ML-% | INTRAVENOUS | Status: AC
Start: 2017-11-14 — End: 2017-11-14
  Administered 2017-11-14: 23:00:00 1 g via INTRAVENOUS

## 2017-11-14 MED FILL — MORPHINE SULFATE 4 MG/ML IJ SOLN: 4 mg/mL | INTRAMUSCULAR | Qty: 1

## 2017-11-14 MED FILL — MAGNESIUM SULFATE IN D5W 1-5 GM/100ML-% IV SOLN: 1-5 GM/100ML-% | INTRAVENOUS | Qty: 100

## 2017-11-14 MED FILL — ASPIRIN 81 MG PO CHEW: 81 mg | ORAL | Qty: 4

## 2017-11-14 NOTE — ED Notes (Signed)
Pt. Ambulatory to restroom with steady gait     Audley HoseKyle Ezrie Bunyan, RN  11/14/17 1928

## 2017-11-14 NOTE — ED Provider Notes (Signed)
[facility]  Emergency Department  Emergency Medicine Resident Sign-out     Care of Renae Glosslizabeth J Fager was assumed from Dr. Santiago GladBurden and is being seen for Gastroesophageal Reflux and Chest Pain (started around 9am )  .  The patient's initial evaluation and plan have been discussed with the prior provider who initially evaluated the patient.     EMERGENCY DEPARTMENT COURSE / MEDICAL DECISION MAKING:       MEDICATIONS GIVEN:  Orders Placed This Encounter   Medications   ??? DISCONTD: aspirin chewable tablet 324 mg   ??? morphine injection 4 mg       LABS / RADIOLOGY:     Labs Reviewed   CBC WITH AUTO DIFFERENTIAL - Abnormal; Notable for the following components:       Result Value    Seg Neutrophils 68 (*)     Lymphocytes 19 (*)     All other components within normal limits   BASIC METABOLIC PANEL - Abnormal; Notable for the following components:    CREATININE 0.48 (*)     All other components within normal limits   TROPONIN   TROPONIN       Xr Chest Standard (2 Vw)    Result Date: 11/14/2017  EXAMINATION: TWO XRAY VIEWS OF THE CHEST 11/14/2017 2:44 pm COMPARISON: 09/28/2017 HISTORY: ORDERING SYSTEM PROVIDED HISTORY: chest pain TECHNOLOGIST PROVIDED HISTORY: chest pain FINDINGS: Frontal and lateral views of the chest are submitted for review.  The cardiac silhouette is normal in size.  Lung parenchyma is clear without focal airspace consolidation, sizeable pleural effusion, or pneumothorax.  Trachea is midline.  Osseous structures and soft tissues are grossly intact.     No acute cardiopulmonary pathology.       RECENT VITALS:     Temp: 98.3 ??F (36.8 ??C),  Pulse: 83, Resp: 19, BP: (!) 154/80, SpO2: 98 %    This patient is a 57 y.o. Female with CP, cardiac labs & imaging neg. Admitted to ETU.     Patient reevaluated after having a nonsustained run of V. tach on monitor.  Patient continuing to have chest pain at rest assistant with unstable angina.  Consulted cardiology and recommending IV heparin if  troponin elevates or chest pain persists.  Patient admitted to [facility] for further monitoring.  Canceled admission to observation unit.  Vital signs remain stable.    OUTSTANDING TASKS / RECOMMENDATIONS:    1. Awaiting bed     FINAL IMPRESSION:     1. Chest pain, unspecified type        DISPOSITION:         DISPOSITION:  []   Discharge   []   Transfer -    [x]   Admission -     []   Against Medical Advice   []   Eloped   FOLLOW-UP: Anise Salvoeborah S Tyler, APRN - CNP  [address]  [place] [id number]  [phone]           DISCHARGE MEDICATIONS: New Prescriptions    No medications on file          Horace PorteousAnthony D'Errico, MD  Emergency Medicine Resident  [facility]       Horace PorteousAnthony D'Errico, MD  11/16/17 [phone]

## 2017-11-14 NOTE — ED Notes (Signed)
Bed: 27  Expected date:   Expected time:   Means of arrival:   Comments:  LS 1     Vassie LollJoseph W Carlen Fils, RN  11/14/17 [phone]

## 2017-11-14 NOTE — ED Notes (Signed)
Pt. Report received from Serenaom, RN     Audley HoseKyle Annistyn Depass, RN  11/14/17 [phone]

## 2017-11-14 NOTE — ED Notes (Signed)
Pt presented to ed via life squad c/o midsternal cp and some GERD. Pt is A&Ox4 with even non labored breaths. Pt ambulated to bed from hallway cot with steady gait. Pt states the CP started around 9am. Pt denies sob. Pt reports hx of GERD and thinks the symptoms are similar. Pt placed on full monitor, ekg done, and iv established.      Ihor Austinhomas J Talen Poser, RN  11/14/17 [phone]

## 2017-11-14 NOTE — ED Provider Notes (Signed)
[facility]  Emergency Department Encounter  Emergency Medicine Resident     Pt Name: Shannon Allison  MRN: [id number]  Birthdate [birth year]  Date of evaluation: 11/14/17  PCP:  Anise Salvoeborah S Tyler, APRN - CNP    CHIEF COMPLAINT       Chief Complaint   Patient presents with   ??? Gastroesophageal Reflux   ??? Chest Pain     started around 9am        HISTORY OFPRESENT ILLNESS  (Location/Symptom, Timing/Onset, Context/Setting, Quality, Duration, Modifying Factors,Severity.)      Shannon Glosslizabeth J Latendresse is a 57 year old female, PMH asthma, COPD, hypertension, diabetes, Parkinson's disease, who presents with midsternal chest pain.  The patient reports that the chest pain started at 9 AM this morning after eating breakfast.  She describes the pain as burning in nature and constant.  Patient had a appointment with her family medicine physician today and she was instructed to report to the ED.  The atient has had similar chest pain in the past and she is been taking medication for GERD. Denies any nausea, vomiting or diaphoresis.  Patient denies any recent surgeries or hospitalizations.  The patient did [place] to [place] and back last week.  She denies any history of DVT/PE.  EMS gave the patient ASA prior to arrival.     PAST MEDICAL / SURGICAL / SOCIAL / FAMILY HISTORY      has a past medical history of Asthma, COPD (chronic obstructive pulmonary disease) (HCC), Diabetes mellitus (HCC), Hyperlipidemia, Hypertension, Parkinson disease (HCC), and Pneumonia.     has a past surgical history that includes Nasal septum surgery (Bilateral); pr esophagogastroduodenoscopy transoral diagnostic (N/A, 01/04/2016); pr colon ca scrn not hi rsk ind (01/04/2016); and pr colon ca scrn not hi rsk ind (N/A, 02/10/2016).     Social History     Socioeconomic History   ??? Marital status: Single     Spouse name: Not on file   ??? Number of children: Not on file   ??? Years of education: Not on file   ??? Highest education level: Not on file   Occupational  History   ??? Not on file   Social Needs   ??? Financial resource strain: Not on file   ??? Food insecurity:     Worry: Not on file     Inability: Not on file   ??? Transportation needs:     Medical: Not on file     Non-medical: Not on file   Tobacco Use   ??? Smoking status: Current Every Day Smoker     Packs/day: 0.25   ??? Smokeless tobacco: Never Used   Substance and Sexual Activity   ??? Alcohol use: No   ??? Drug use: No   ??? Sexual activity: Not on file   Lifestyle   ??? Physical activity:     Days per week: Not on file     Minutes per session: Not on file   ??? Stress: Not on file   Relationships   ??? Social connections:     Talks on phone: Not on file     Gets together: Not on file     Attends religious service: Not on file     Active member of club or organization: Not on file     Attends meetings of clubs or organizations: Not on file     Relationship status: Not on file   ??? Intimate partner violence:  Fear of current or ex partner: Not on file     Emotionally abused: Not on file     Physically abused: Not on file     Forced sexual activity: Not on file   Other Topics Concern   ??? Not on file   Social History Narrative   ??? Not on file       Family History   Problem Relation Age of Onset   ??? High Blood Pressure Mother    ??? High Blood Pressure Father         Allergies:  Patient has no known allergies.    Home Medications:  Prior to Admission medications    Medication Sig Start Date End Date Taking? Authorizing Provider   rOPINIRole (REQUIP) 4 MG tablet Take 1 tablet by mouth 3 times daily 09/20/17   Foy Guadalajara, APRN - CNP   carbidopa-levodopa (SINEMET) 25-100 MG per tablet Take 1 tablet by mouth 3 times daily 09/20/17   Foy Guadalajara, APRN - CNP   acetaminophen (TYLENOL) 325 MG tablet Take 2 tablets by mouth every 4 hours as needed for Pain or Fever 12/31/16   Laray Anger, MD   hydrOXYzine (VISTARIL) 25 MG capsule Take 1 capsule by mouth 3 times daily as needed for Itching 12/31/16   Laray Anger, MD   albuterol  (PROVENTIL) (2.5 MG/3ML) 0.083% nebulizer solution Take 3 mLs by nebulization every 4 hours as needed for Wheezing or Shortness of Breath 12/31/16   Laray Anger, MD   ipratropium-albuterol (DUONEB) 0.5-2.5 (3) MG/3ML SOLN nebulizer solution Inhale 3 mLs into the lungs every 4 hours as needed for Shortness of Breath 12/31/16   Laray Anger, MD   montelukast (SINGULAIR) 10 MG tablet Take 1 tablet by mouth nightly 12/31/16   Laray Anger, MD   tiotropium (SPIRIVA HANDIHALER) 18 MCG inhalation capsule Inhale 1 capsule into the lungs daily 12/31/16   Laray Anger, MD   desvenlafaxine succinate (PRISTIQ) 100 MG TB24 extended release tablet Take 1 tablet by mouth daily 12/31/16   Laray Anger, MD   metFORMIN (GLUCOPHAGE) 500 MG tablet Take 1 tablet by mouth 2 times daily (with meals) 12/31/16   Laray Anger, MD   atorvastatin (LIPITOR) 20 MG tablet Take 1 tablet by mouth daily 12/31/16   Laray Anger, MD   lisinopril (PRINIVIL;ZESTRIL) 5 MG tablet Take 1 tablet by mouth daily 12/31/16   Laray Anger, MD   QUEtiapine (SEROQUEL) 100 MG tablet Take 1 tablet by mouth nightly 12/31/16   Laray Anger, MD   fluticasone (FLONASE) 50 MCG/ACT nasal spray 1 spray by Nasal route daily 12/31/16   Laray Anger, MD   folic acid (FOLVITE) 1 MG tablet Take 1 tablet by mouth daily 12/31/16   Laray Anger, MD   magnesium hydroxide (MILK OF MAGNESIA) 400 MG/5ML suspension Take 30 mLs by mouth daily as needed for Constipation 12/31/16   Laray Anger, MD   sennosides-docusate sodium (SENOKOT-S) 8.6-50 MG tablet Take 1 tablet by mouth daily 12/31/16   Laray Anger, MD   esomeprazole Magnesium (NEXIUM) 40 MG PACK Take 1 packet by mouth daily 12/31/16   Laray Anger, MD   paliperidone palmitate (INVEGA SUSTENNA) 234 MG/1.5ML SUSP IM injection Inject 234 mg into the muscle every 30 days    Historical Provider, MD       REVIEW OFSYSTEMS    (2-9 systems for level 4, 10 or more for level 5)      Review of Systems   Constitutional:  Negative for chills and fever.   Eyes: Negative for visual disturbance.   Respiratory: Positive for shortness of breath.    Cardiovascular: Positive for chest pain.   Gastrointestinal: Negative for abdominal pain, nausea and vomiting.   Genitourinary: Negative for difficulty urinating, dyspareunia and dysuria.   Musculoskeletal: Negative for back pain and neck pain.   Skin: Negative for rash and wound.   Neurological: Negative for light-headedness, numbness and headaches.   Hematological: Negative for adenopathy. Does not bruise/bleed easily.       PHYSICAL EXAM   (up to 7 for level 4, 8 or more forlevel 5)      INITIAL VITALS:   ED Triage Vitals [11/14/17 1418]   BP Temp Temp Source Pulse Resp SpO2 Height Weight   (!) 154/80 98.3 ??F (36.8 ??C) Oral 83 19 -- -- --       Physical Exam   Constitutional: She is oriented to person, place, and time. No distress.   Left hand tremor   HENT:   Head: Normocephalic and atraumatic.   Mouth/Throat: Oropharynx is clear and moist.   Eyes: Pupils are equal, round, and reactive to light. Conjunctivae and EOM are normal.   Neck: Neck supple.   Cardiovascular: Normal rate and regular rhythm. Exam reveals no gallop and no friction rub.   No murmur heard.  Tenderness palpation anterior chest wall   Pulmonary/Chest: Effort normal and breath sounds normal. No stridor. No respiratory distress. She has no wheezes.   Abdominal: Soft. She exhibits no distension. There is no tenderness. There is no guarding.   Musculoskeletal:   No lower extremity edema, +2TP and +2 radial pulses bilaterally   Neurological: She is alert and oriented to person, place, and time.   Skin: Skin is warm. She is not diaphoretic.       DIFFERENTIAL  DIAGNOSIS     PLAN (LABS / IMAGING / EKG):  Orders Placed This Encounter   Procedures   ??? XR CHEST STANDARD (2 VW)   ??? CBC WITH AUTO DIFFERENTIAL   ??? Basic Metabolic Panel   ??? Troponin   ??? EKG 12 Lead       MEDICATIONS ORDERED:  Orders Placed This Encounter   Medications    ??? aspirin chewable tablet 324 mg   ??? morphine injection 4 mg       DDX: Emergent: ACS/NSTEMI/STEMI/angina, arrhythmia, trauma, aortic dissection,  PE, PNA, pneumothroax, esophageal rupture, tamponade, Cocaine use  Nonemergent: pneumonia, pericarditis, GERD, MSK, Endocarditis, anxiety     Evaluate for: diaphoresis, present chest pain, tachypnea, BP both arms, heart sounds, JVD, tender chest wall, wheezing      Initial MDM/Plan: 57 y.o. female who presents with midsternal chest pain since 9 am this morning. The patient had stable vital signs on arrival.  On physical exam, her lungs were clear to auscultation bilaterally.  She had no murmurs, rubs or gallops.  No lower extremity edema.  Her abdomen soft and nontender.  Will get serial troponins, EKG, CBC and BMP.  The patient has risk factors for ACS and has never been evaluated by cardiology.      DIAGNOSTIC RESULTS / EMERGENCYDEPARTMENT COURSE / MDM     LABS:  Labs Reviewed   CBC WITH AUTO DIFFERENTIAL - Abnormal; Notable for the following components:       Result Value    Seg Neutrophils 68 (*)     Lymphocytes 19 (*)     All other components within normal limits   BASIC  METABOLIC PANEL - Abnormal; Notable for the following components:    CREATININE 0.48 (*)     All other components within normal limits   TROPONIN   TROPONIN         RADIOLOGY:  Xr Chest Standard (2 Vw)    Result Date: 11/14/2017  EXAMINATION: TWO XRAY VIEWS OF THE CHEST 11/14/2017 2:44 pm COMPARISON: 09/28/2017 HISTORY: ORDERING SYSTEM PROVIDED HISTORY: chest pain TECHNOLOGIST PROVIDED HISTORY: chest pain FINDINGS: Frontal and lateral views of the chest are submitted for review.  The cardiac silhouette is normal in size.  Lung parenchyma is clear without focal airspace consolidation, sizeable pleural effusion, or pneumothorax.  Trachea is midline.  Osseous structures and soft tissues are grossly intact.     No acute cardiopulmonary pathology.         EKG  EKG Interpretation    Interpreted by emergency  department physician    Rhythm: normal sinus   Rate: normal  Axis: left  Ectopy: none  Conduction: normal and left anterior fasciclar block  ST Segments: normal  T Waves: flattening in  v2 and v3  Q Waves: III    Clinical Impression: new Q waves in lead III compared to EKG 09/28/17    [place] Jenavie Stanczak      All EKG's are interpreted by the Emergency Department Physicianwho either signs or Co-signs this chart in the absence of a cardiologist.        Heart Score  History   Highly suspicious???????????????????????????2            Moderately suspicious??????????????????...1     = 0    Slightly or non suspicious???????????????.0      ECG   Significant STD???????????????????????????....2        Nonspecific repolarization??????..?????????1      = 1    Normal (no change from previous).???..0      Age   >65????????????????????????????????????????????????2      >45 - <64???????????????????????????????????????.1      = 1   <45?????????????????????????????????????????????...0      Risk Factors  >2 risk factors?????????????????????????????????..2     1 - 2 risk factors??????????????????????????????..1      = 2  No risk factors???????????????????????????.???....0     Troponin   >3times normal limit????????????????????????...2      >1 time - <3 times normal limit????????????.1    = 0    Normal trop????????????????????????????????????..0     -----------------------------------------------------------------------------------------  TOTAL RISK SCORE      =  4      Risk Factors: DM, smoker (current or recent), HTN, HLP, FHx CAD, obesity,   dsv add-ons SLE, CKDz, HIV, cocaine abuse    Score 0-3: 2.5% MACE over next 6 weeks = Discharge Home  Score 4-6: 20.3% MACE over next 6 weeks = Admit for Clinical Observation  Score 7-10: 72.7% MACE over next 6 weeks = Early Invasive Strategies   ???Chest Pain in the Emergency Room: A Multicenter Validation of the HEART Score???. [employer], Six AJ, Kelder JC, Mast TP, [employer], Mast EG, Monnink SH, [employer] RM, Valentine [place]. Crit Pathw Cardiol. 2010 Sep; 9(3): 161-096.  "A prospective validation of the HEART score for chest pain patients at the emergency department." Int J Cardiol. 2013 Oct 3;168(3):2153-8. doi: 10.1016/j.ijcard.2013.01.255. Epub 2013 Mar  7.      EMERGENCY DEPARTMENT COURSE:      Patient's EKG showed new Q waves in lead III and T wave flattening in V2 V3.  Her troponins were within normal limits.  Her chest  x-ray showed no acute pathology.  Patient's heart score is 4.  Will admit to the observation unit for cardiology evaluation.      PROCEDURES:  None    CONSULTS:  None    CRITICAL CARE:  Please see attending note    FINAL IMPRESSION      1. Chest pain, unspecified type          DISPOSITION / PLAN     DISPOSITION        PATIENT REFERRED TO:  No follow-up provider specified.    DISCHARGE MEDICATIONS:  New Prescriptions    No medications on file       Arneta Cliche, MD  Emergency Medicine Resident    (Please note that portions of this note were completed with a voice recognition program.Efforts were made to edit the dictations but occasionally words are mis-transcribed.)       Arneta Cliche, MD  Resident  11/14/17 1648       Arneta Cliche, MD  Resident  11/14/17 [phone]

## 2017-11-14 NOTE — Progress Notes (Signed)
Pt is still having some chest discomfort, in the mid sternal region like it was earlier. Pt said the morphine helped in the ER. Last dose of 4 mg was given at 1510. Paged NP Carlena HurlShirley Waterhouse to see if pt could get a PRN pain medication ordered. Waiting for reply, will continue to monitor.

## 2017-11-14 NOTE — ED Provider Notes (Signed)
[facility]     Emergency Department     Faculty Attestation    I performed a history and physical examination of the patient and discussed management with the resident. I have reviewed and agree with the resident???s findings including all diagnostic interpretations, and treatment plans as written at the time of my review. Any areas of disagreement are noted on the chart. I was personally present for the key portions of any procedures. I have documented in the chart those procedures where I was not present during the key portions. For Physician Assistant/ Nurse Practitioner cases/documentation I have personally evaluated this patient and have completed at least one if not all key elements of the E/M (history, physical exam, and MDM). Additional findings are as noted.    Primary Care Physician: Anise Salvoeborah S Tyler, APRN - CNP    History: This is a 57 y.o. female who presents to the Emergency Department with complaint of chest pain.  The patient presents emerged her complaining of chest pain that has been ongoing since earlier today.  Describes as a sharp burning pain.  She has no associated shortness of breath or nausea vomiting.  Nothing exacerbates her symptoms and nothing improves her symptoms.    Physical:   oral temperature is 98.3 ??F (36.8 ??C). Her blood pressure is 154/80 (abnormal) and her pulse is 83. Her respiration is 19.  Lungs are clear to auscultation bilaterally, heart regular rate and rhythm, no evidence soft nontender    Impression: Chest pain    Plan: Chest x-ray, EKG, CBC, BMP, troponin    This patient was turned over to Dr. Laural BenesJohnson at shift change.      EKG Interpretation    Interpreted by me  Normal sinus rhythm with a ventricular rate of 79, normal PR interval, left anterior fascicular block, inferior infarct, age undetermined, prolonged QT corrected  Compared EKG of April 30, 2017      (Please note that portions of this note were completed  with a voice recognition program.  Efforts were made to edit the dictations but occasionally words are mis-transcribed.)    Earl LitesGregory P. Delorise ShinerHymel, MD, [facility]  Attending Emergency Medicine Physician        Lauro FranklinGregory P Jasper Hanf, MD  11/14/17 1452

## 2017-11-14 NOTE — ED Notes (Signed)
Report given to CAR 2     Audley HoseKyle Bradly Sangiovanni, RN  11/14/17 2003

## 2017-11-15 ENCOUNTER — Inpatient Hospital Stay: Admit: 2017-11-15 | Payer: MEDICAID | Primary: Nurse Practitioner

## 2017-11-15 ENCOUNTER — Encounter: Primary: Nurse Practitioner

## 2017-11-15 LAB — CBC
Hematocrit: 41.5 % (ref 36.3–47.1)
Hematocrit: 38.4 % (ref 36.3–47.1)
Hemoglobin: 13 g/dL (ref 11.9–15.1)
Hemoglobin: 12 g/dL (ref 11.9–15.1)
MCH: 28.5 pg (ref 25.2–33.5)
MCH: 28.6 pg (ref 25.2–33.5)
MCHC: 31.3 g/dL (ref 28.4–34.8)
MCHC: 31.3 g/dL (ref 28.4–34.8)
MCV: 91 fL (ref 82.6–102.9)
MCV: 91.4 fL (ref 82.6–102.9)
MPV: 10.5 fL (ref 8.1–13.5)
MPV: 10.1 fL (ref 8.1–13.5)
NRBC Automated: 0 per 100 WBC
NRBC Automated: 0 per 100 WBC
Platelets: 319 10*3/uL (ref 138–453)
Platelets: 300 10*3/uL (ref 138–453)
RBC: 4.56 m/uL (ref 3.95–5.11)
RBC: 4.2 m/uL (ref 3.95–5.11)
RDW: 12.9 % (ref 11.8–14.4)
RDW: 13.1 % (ref 11.8–14.4)
WBC: 9.2 10*3/uL (ref 3.5–11.3)
WBC: 7.1 10*3/uL (ref 3.5–11.3)

## 2017-11-15 LAB — TROPONIN
Troponin, High Sensitivity: <6 ng/L (ref 0–14)
Troponin, High Sensitivity: <6 ng/L (ref 0–14)

## 2017-11-15 LAB — EKG 12-LEAD
Atrial Rate: 79 {beats}/min
P Axis: 46 degrees
P-R Interval: 156 ms
Q-T Interval: 428 ms
QRS Duration: 94 ms
QTc Calculation (Bazett): 490 ms
R Axis: -66 degrees
T Axis: 26 degrees
Ventricular Rate: 79 {beats}/min

## 2017-11-15 LAB — COMPREHENSIVE METABOLIC PANEL W/ REFLEX TO MG FOR LOW K
ALT: 18 U/L (ref 5–33)
AST: 13 U/L (ref <32)
Albumin/Globulin Ratio: 1.2 (ref 1.0–2.5)
Albumin: 3.6 g/dL (ref 3.5–5.2)
Alkaline Phosphatase: 82 U/L (ref 35–104)
Anion Gap: 10 mmol/L (ref 9–17)
BUN: 10 mg/dL (ref 6–20)
CO2: 27 mmol/L (ref 20–31)
Calcium: 8.7 mg/dL (ref 8.6–10.4)
Chloride: 99 mmol/L (ref 98–107)
Creatinine: 0.41 mg/dL — ABNORMAL LOW (ref 0.50–0.90)
GFR African American: >60 mL/min (ref >60)
GFR Non-African American: >60 mL/min (ref >60)
Glucose: 146 mg/dL — ABNORMAL HIGH (ref 70–99)
Potassium: 3.8 mmol/L (ref 3.7–5.3)
Sodium: 136 mmol/L (ref 135–144)
Total Bilirubin: 0.21 mg/dL — ABNORMAL LOW (ref 0.3–1.2)
Total Protein: 6.6 g/dL (ref 6.4–8.3)

## 2017-11-15 LAB — HEMOGLOBIN A1C
Estimated Avg Glucose: 134 mg/dL
Hemoglobin A1C: 6.3 % — ABNORMAL HIGH (ref 4.0–6.0)

## 2017-11-15 LAB — ECHOCARDIOGRAM COMPLETE 2D W DOPPLER W COLOR: Left Ventricular Ejection Fraction: 55

## 2017-11-15 LAB — LIPID PANEL
Chol/HDL Ratio: 2.5 (ref <5)
Cholesterol: 124 mg/dL (ref <200)
HDL: 50 mg/dL (ref >40)
LDL Cholesterol: 61 mg/dL (ref 0–130)
Triglycerides: 65 mg/dL (ref <150)

## 2017-11-15 LAB — POC GLUCOSE FINGERSTICK
POC Glucose: 127 mg/dL — ABNORMAL HIGH (ref 65–105)
POC Glucose: 163 mg/dL — ABNORMAL HIGH (ref 65–105)
POC Glucose: 117 mg/dL — ABNORMAL HIGH (ref 65–105)
POC Glucose: 112 mg/dL — ABNORMAL HIGH (ref 65–105)
POC Glucose: 120 mg/dL — ABNORMAL HIGH (ref 65–105)
POC Glucose: 160 mg/dL — ABNORMAL HIGH (ref 65–105)

## 2017-11-15 LAB — APTT
APTT: 23.9 s (ref 20.5–30.5)
APTT: 33.3 s — ABNORMAL HIGH (ref 20.5–30.5)
APTT: 33.6 s — ABNORMAL HIGH (ref 20.5–30.5)

## 2017-11-15 LAB — NM MYOCARDIAL SPECT REST EXERCISE OR RX: Left Ventricular Ejection Fraction: 65

## 2017-11-15 LAB — MAGNESIUM: Magnesium: 2 mg/dL (ref 1.6–2.6)

## 2017-11-15 LAB — BRAIN NATRIURETIC PEPTIDE: Pro-BNP: <20 pg/mL (ref <300)

## 2017-11-15 MED ORDER — REGADENOSON 0.4 MG/5ML IV SOLN
0.4 MG/5ML | Freq: Once | INTRAVENOUS | Status: AC | PRN
Start: 2017-11-15 — End: 2017-11-15
  Administered 2017-11-15: 15:00:00 0.4 mg via INTRAVENOUS

## 2017-11-15 MED ORDER — TIOTROPIUM BROMIDE MONOHYDRATE 18 MCG IN CAPS
18 MCG | Freq: Every day | RESPIRATORY_TRACT | Status: DC
Start: 2017-11-15 — End: 2017-11-17
  Administered 2017-11-15 – 2017-11-17 (×4): 18 ug via RESPIRATORY_TRACT

## 2017-11-15 MED ORDER — FOLIC ACID 1 MG PO TABS
1 MG | Freq: Every day | ORAL | Status: DC
Start: 2017-11-15 — End: 2017-11-17
  Administered 2017-11-15 – 2017-11-17 (×4): 1 mg via ORAL

## 2017-11-15 MED ORDER — NORMAL SALINE FLUSH 0.9 % IV SOLN
0.9 % | INTRAVENOUS | Status: DC | PRN
Start: 2017-11-15 — End: 2017-11-17

## 2017-11-15 MED ORDER — ALBUTEROL SULFATE (2.5 MG/3ML) 0.083% IN NEBU
RESPIRATORY_TRACT | Status: DC | PRN
Start: 2017-11-15 — End: 2017-11-17

## 2017-11-15 MED ORDER — NORMAL SALINE FLUSH 0.9 % IV SOLN
0.9 % | INTRAVENOUS | Status: DC | PRN
Start: 2017-11-15 — End: 2017-11-17
  Administered 2017-11-15: 15:00:00 10 mL via INTRAVENOUS

## 2017-11-15 MED ORDER — ESOMEPRAZOLE MAGNESIUM 40 MG PO PACK
40 MG | Freq: Every day | ORAL | Status: DC
Start: 2017-11-15 — End: 2017-11-14

## 2017-11-15 MED ORDER — DEXTROSE 5 % IV SOLN
5 % | INTRAVENOUS | Status: DC | PRN
Start: 2017-11-15 — End: 2017-11-17

## 2017-11-15 MED ORDER — IPRATROPIUM-ALBUTEROL 0.5-2.5 (3) MG/3ML IN SOLN
RESPIRATORY_TRACT | Status: DC | PRN
Start: 2017-11-15 — End: 2017-11-17

## 2017-11-15 MED ORDER — ATORVASTATIN CALCIUM 80 MG PO TABS
80 MG | Freq: Every evening | ORAL | Status: DC
Start: 2017-11-15 — End: 2017-11-15
  Administered 2017-11-15: 02:00:00 80 mg via ORAL

## 2017-11-15 MED ORDER — GLUCAGON HCL RDNA (DIAGNOSTIC) 1 MG IJ SOLR
1 MG | INTRAMUSCULAR | Status: DC | PRN
Start: 2017-11-15 — End: 2017-11-17

## 2017-11-15 MED ORDER — HEPARIN SODIUM (PORCINE) 1000 UNIT/ML IJ SOLN
1000 UNIT/ML | INTRAMUSCULAR | Status: DC | PRN
Start: 2017-11-15 — End: 2017-11-16
  Administered 2017-11-15 – 2017-11-16 (×4): 2000 [IU] via INTRAVENOUS

## 2017-11-15 MED ORDER — HYDROXYZINE HCL 25 MG PO TABS
25 MG | Freq: Three times a day (TID) | ORAL | Status: DC | PRN
Start: 2017-11-15 — End: 2017-11-17
  Administered 2017-11-15: 12:00:00 25 mg via ORAL

## 2017-11-15 MED ORDER — METOPROLOL TARTRATE 5 MG/5ML IV SOLN
5 MG/ML | INTRAVENOUS | Status: DC | PRN
Start: 2017-11-15 — End: 2017-11-15

## 2017-11-15 MED ORDER — MAGNESIUM HYDROXIDE 400 MG/5ML PO SUSP
400 MG/5ML | Freq: Every day | ORAL | Status: DC | PRN
Start: 2017-11-15 — End: 2017-11-14

## 2017-11-15 MED ORDER — GLUCOSE 40 % PO GEL
40 % | ORAL | Status: DC | PRN
Start: 2017-11-15 — End: 2017-11-17

## 2017-11-15 MED ORDER — POTASSIUM CHLORIDE CRYS ER 20 MEQ PO TBCR
20 MEQ | ORAL | Status: DC | PRN
Start: 2017-11-15 — End: 2017-11-17

## 2017-11-15 MED ORDER — TECHNETIUM TC 99M SESTAMIBI - CARDIOLITE
Freq: Once | Status: AC | PRN
Start: 2017-11-15 — End: 2017-11-15
  Administered 2017-11-15: 13:00:00 13.9 via INTRAVENOUS

## 2017-11-15 MED ORDER — MAGNESIUM SULFATE IN D5W 1-5 GM/100ML-% IV SOLN
1-5 GM/100ML-% | INTRAVENOUS | Status: DC | PRN
Start: 2017-11-15 — End: 2017-11-17

## 2017-11-15 MED ORDER — ATROPINE SULFATE 1 MG/10ML IJ SOSY
1 MG/0ML | INTRAMUSCULAR | Status: DC | PRN
Start: 2017-11-15 — End: 2017-11-15

## 2017-11-15 MED ORDER — NORMAL SALINE FLUSH 0.9 % IV SOLN
0.9 % | Freq: Two times a day (BID) | INTRAVENOUS | Status: DC
Start: 2017-11-15 — End: 2017-11-17
  Administered 2017-11-17 (×2): 10 mL via INTRAVENOUS

## 2017-11-15 MED ORDER — ATORVASTATIN CALCIUM 20 MG PO TABS
20 MG | Freq: Every day | ORAL | Status: DC
Start: 2017-11-15 — End: 2017-11-14

## 2017-11-15 MED ORDER — SODIUM CHLORIDE 0.9 % IV SOLN
0.9 % | INTRAVENOUS | Status: DC | PRN
Start: 2017-11-15 — End: 2017-11-15

## 2017-11-15 MED ORDER — NORMAL SALINE FLUSH 0.9 % IV SOLN
0.9 % | INTRAVENOUS | Status: DC | PRN
Start: 2017-11-15 — End: 2017-11-14

## 2017-11-15 MED ORDER — HEPARIN SODIUM (PORCINE) 1000 UNIT/ML IJ SOLN
1000 UNIT/ML | Freq: Once | INTRAMUSCULAR | Status: AC
Start: 2017-11-15 — End: 2017-11-14
  Administered 2017-11-15: 02:00:00 4000 [IU] via INTRAVENOUS

## 2017-11-15 MED ORDER — AMINOPHYLLINE 25 MG/ML IV SOLN
25 MG/ML | INTRAVENOUS | Status: DC | PRN
Start: 2017-11-15 — End: 2017-11-15

## 2017-11-15 MED ORDER — ALBUTEROL SULFATE HFA 108 (90 BASE) MCG/ACT IN AERS
108 (90 Base) MCG/ACT | RESPIRATORY_TRACT | Status: DC | PRN
Start: 2017-11-15 — End: 2017-11-15

## 2017-11-15 MED ORDER — CARBIDOPA-LEVODOPA 25-100 MG PO TABS
25-100 MG | Freq: Three times a day (TID) | ORAL | Status: DC
Start: 2017-11-15 — End: 2017-11-17
  Administered 2017-11-15 – 2017-11-17 (×9): 1 via ORAL

## 2017-11-15 MED ORDER — SODIUM CHLORIDE 0.9 % IV SOLN
0.9 % | INTRAVENOUS | Status: DC | PRN
Start: 2017-11-15 — End: 2017-11-15
  Administered 2017-11-15: 15:00:00 500 mL via INTRAVENOUS

## 2017-11-15 MED ORDER — NORMAL SALINE FLUSH 0.9 % IV SOLN
0.9 % | Freq: Two times a day (BID) | INTRAVENOUS | Status: DC
Start: 2017-11-15 — End: 2017-11-17
  Administered 2017-11-16 – 2017-11-17 (×3): 10 mL via INTRAVENOUS

## 2017-11-15 MED ORDER — POTASSIUM BICARB-CITRIC ACID 20 MEQ PO TBEF
20 MEQ | ORAL | Status: DC | PRN
Start: 2017-11-15 — End: 2017-11-17

## 2017-11-15 MED ORDER — MONTELUKAST SODIUM 10 MG PO TABS
10 MG | Freq: Every evening | ORAL | Status: DC
Start: 2017-11-15 — End: 2017-11-17
  Administered 2017-11-15 – 2017-11-17 (×3): 10 mg via ORAL

## 2017-11-15 MED ORDER — HEPARIN SODIUM (PORCINE) 1000 UNIT/ML IJ SOLN
1000 UNIT/ML | INTRAMUSCULAR | Status: DC | PRN
Start: 2017-11-15 — End: 2017-11-16

## 2017-11-15 MED ORDER — MORPHINE SULFATE 2 MG/ML IJ SOLN
2 MG/ML | INTRAMUSCULAR | Status: DC | PRN
Start: 2017-11-15 — End: 2017-11-17
  Administered 2017-11-15 – 2017-11-16 (×2): 2 mg via INTRAVENOUS

## 2017-11-15 MED ORDER — INSULIN LISPRO 100 UNIT/ML SC SOLN
100 UNIT/ML | SUBCUTANEOUS | Status: DC
Start: 2017-11-15 — End: 2017-11-17
  Administered 2017-11-15 – 2017-11-16 (×2): 2 [IU] via SUBCUTANEOUS
  Administered 2017-11-17: 13:00:00 4 [IU] via SUBCUTANEOUS

## 2017-11-15 MED ORDER — SODIUM CHLORIDE 0.9 % IV SOLN
0.9 % | INTRAVENOUS | Status: DC
Start: 2017-11-15 — End: 2017-11-16
  Administered 2017-11-15 – 2017-11-16 (×2): via INTRAVENOUS

## 2017-11-15 MED ORDER — ROPINIROLE HCL 1 MG PO TABS
1 MG | Freq: Three times a day (TID) | ORAL | Status: DC
Start: 2017-11-15 — End: 2017-11-17
  Administered 2017-11-15 – 2017-11-17 (×9): 4 mg via ORAL

## 2017-11-15 MED ORDER — FLUTICASONE PROPIONATE 50 MCG/ACT NA SUSP
50 MCG/ACT | Freq: Every day | NASAL | Status: DC
Start: 2017-11-15 — End: 2017-11-17

## 2017-11-15 MED ORDER — QUETIAPINE FUMARATE 100 MG PO TABS
100 MG | Freq: Every evening | ORAL | Status: DC
Start: 2017-11-15 — End: 2017-11-17
  Administered 2017-11-15 – 2017-11-17 (×3): 100 mg via ORAL

## 2017-11-15 MED ORDER — SENNA-DOCUSATE SODIUM 8.6-50 MG PO TABS
Freq: Every day | ORAL | Status: DC
Start: 2017-11-15 — End: 2017-11-17
  Administered 2017-11-15 – 2017-11-17 (×2): 1 via ORAL

## 2017-11-15 MED ORDER — SODIUM CHLORIDE 0.9 % IV BOLUS
0.9 % | Freq: Once | INTRAVENOUS | Status: AC
Start: 2017-11-15 — End: 2017-11-15
  Administered 2017-11-15: 10:00:00 1000 mL via INTRAVENOUS

## 2017-11-15 MED ORDER — KCL IN DEXTROSE-NACL 20-5-0.45 MEQ/L-%-% IV SOLN
INTRAVENOUS | Status: DC
Start: 2017-11-15 — End: 2017-11-15

## 2017-11-15 MED ORDER — DEXTROSE 50 % IV SOLN
50 % | INTRAVENOUS | Status: DC | PRN
Start: 2017-11-15 — End: 2017-11-17

## 2017-11-15 MED ORDER — ATORVASTATIN CALCIUM 40 MG PO TABS
40 MG | Freq: Every evening | ORAL | Status: DC
Start: 2017-11-15 — End: 2017-11-17
  Administered 2017-11-16 – 2017-11-17 (×2): 40 mg via ORAL

## 2017-11-15 MED ORDER — NORMAL SALINE FLUSH 0.9 % IV SOLN
0.9 % | INTRAVENOUS | Status: DC | PRN
Start: 2017-11-15 — End: 2017-11-15

## 2017-11-15 MED ORDER — ACETAMINOPHEN 325 MG PO TABS
325 MG | ORAL | Status: DC | PRN
Start: 2017-11-15 — End: 2017-11-17

## 2017-11-15 MED ORDER — MORPHINE SULFATE (PF) 4 MG/ML IV SOLN
4 MG/ML | INTRAVENOUS | Status: DC | PRN
Start: 2017-11-15 — End: 2017-11-17
  Administered 2017-11-15: 02:00:00 4 mg via INTRAVENOUS

## 2017-11-15 MED ORDER — TECHNETIUM TC 99M SESTAMIBI - CARDIOLITE
Freq: Once | Status: AC | PRN
Start: 2017-11-15 — End: 2017-11-15
  Administered 2017-11-15: 15:00:00 37.8 via INTRAVENOUS

## 2017-11-15 MED ORDER — ASPIRIN 81 MG PO CHEW
81 MG | Freq: Once | ORAL | Status: DC
Start: 2017-11-15 — End: 2017-11-17

## 2017-11-15 MED ORDER — ASPIRIN 325 MG PO TBEC
325 MG | Freq: Every day | ORAL | Status: DC
Start: 2017-11-15 — End: 2017-11-17
  Administered 2017-11-15 – 2017-11-17 (×3): 325 mg via ORAL

## 2017-11-15 MED ORDER — DEXTROSE-NACL 5-0.45 % IV SOLN
INTRAVENOUS | Status: DC
Start: 2017-11-15 — End: 2017-11-15

## 2017-11-15 MED ORDER — LISINOPRIL 5 MG PO TABS
5 MG | Freq: Every day | ORAL | Status: DC
Start: 2017-11-15 — End: 2017-11-17
  Administered 2017-11-15 – 2017-11-17 (×3): 5 mg via ORAL

## 2017-11-15 MED ORDER — POTASSIUM CHLORIDE 10 MEQ/100ML IV SOLN
10 MEQ/0ML | INTRAVENOUS | Status: DC | PRN
Start: 2017-11-15 — End: 2017-11-17

## 2017-11-15 MED ORDER — METFORMIN HCL 500 MG PO TABS
500 MG | Freq: Two times a day (BID) | ORAL | Status: DC
Start: 2017-11-15 — End: 2017-11-15
  Administered 2017-11-15 (×2): 500 mg via ORAL

## 2017-11-15 MED ORDER — PANTOPRAZOLE SODIUM 40 MG PO TBEC
40 MG | Freq: Every day | ORAL | Status: DC
Start: 2017-11-15 — End: 2017-11-15

## 2017-11-15 MED ORDER — DESVENLAFAXINE SUCCINATE ER 50 MG PO TB24
50 MG | Freq: Every day | ORAL | Status: DC
Start: 2017-11-15 — End: 2017-11-17
  Administered 2017-11-15 – 2017-11-17 (×4): 100 mg via ORAL

## 2017-11-15 MED ORDER — ACETAMINOPHEN 325 MG PO TABS
325 MG | ORAL | Status: DC | PRN
Start: 2017-11-15 — End: 2017-11-14

## 2017-11-15 MED ORDER — HEPARIN SOD (PORCINE) IN D5W 100 UNIT/ML IV SOLN
100 UNIT/ML | INTRAVENOUS | Status: DC
Start: 2017-11-15 — End: 2017-11-16
  Administered 2017-11-15: 21:00:00 1360 [IU]/h via INTRAVENOUS
  Administered 2017-11-15: 02:00:00 1000 [IU]/h via INTRAVENOUS
  Administered 2017-11-16: 03:00:00 1360 [IU]/h via INTRAVENOUS
  Administered 2017-11-16: 21:00:00 1730 [IU]/h via INTRAVENOUS

## 2017-11-15 MED ORDER — FAMOTIDINE 20 MG PO TABS
20 MG | Freq: Two times a day (BID) | ORAL | Status: DC
Start: 2017-11-15 — End: 2017-11-17
  Administered 2017-11-15 – 2017-11-17 (×6): 20 mg via ORAL

## 2017-11-15 MED ORDER — NICOTINE 14 MG/24HR TD PT24
14 MG/24HR | Freq: Every day | TRANSDERMAL | Status: DC
Start: 2017-11-15 — End: 2017-11-17
  Administered 2017-11-15 – 2017-11-17 (×3): 1 via TRANSDERMAL

## 2017-11-15 MED ORDER — LORAZEPAM 1 MG PO TABS
1 MG | Freq: Three times a day (TID) | ORAL | Status: DC | PRN
Start: 2017-11-15 — End: 2017-11-17
  Administered 2017-11-15 – 2017-11-16 (×2): 1 mg via ORAL

## 2017-11-15 MED ORDER — MAGNESIUM HYDROXIDE 400 MG/5ML PO SUSP
400 MG/5ML | Freq: Every day | ORAL | Status: DC | PRN
Start: 2017-11-15 — End: 2017-11-17

## 2017-11-15 MED ORDER — ONDANSETRON HCL 4 MG/2ML IJ SOLN
4 MG/2ML | Freq: Four times a day (QID) | INTRAMUSCULAR | Status: DC | PRN
Start: 2017-11-15 — End: 2017-11-17

## 2017-11-15 MED ORDER — METOPROLOL TARTRATE 25 MG PO TABS
25 MG | Freq: Two times a day (BID) | ORAL | Status: DC
Start: 2017-11-15 — End: 2017-11-17
  Administered 2017-11-15 – 2017-11-16 (×3): 25 mg via ORAL

## 2017-11-15 MED ORDER — PALIPERIDONE PALMITATE 234 MG/1.5ML IM SUSP
234 MG/1.5ML | INTRAMUSCULAR | Status: DC
Start: 2017-11-15 — End: 2017-11-14

## 2017-11-15 MED ORDER — TECHNETIUM TC 99M SESTAMIBI - CARDIOLITE
Freq: Once | Status: DC | PRN
Start: 2017-11-15 — End: 2017-11-15

## 2017-11-15 MED ORDER — NORMAL SALINE FLUSH 0.9 % IV SOLN
0.9 % | INTRAVENOUS | Status: DC | PRN
Start: 2017-11-15 — End: 2017-11-17
  Administered 2017-11-15 – 2017-11-17 (×2): 10 mL via INTRAVENOUS

## 2017-11-15 MED ORDER — ENOXAPARIN SODIUM 40 MG/0.4ML SC SOLN
40 MG/0.4ML | Freq: Every day | SUBCUTANEOUS | Status: DC
Start: 2017-11-15 — End: 2017-11-14

## 2017-11-15 MED ORDER — NITROGLYCERIN 0.4 MG SL SUBL
0.4 MG | SUBLINGUAL | Status: DC | PRN
Start: 2017-11-15 — End: 2017-11-14

## 2017-11-15 MED ORDER — NITROGLYCERIN 0.4 MG SL SUBL
0.4 MG | SUBLINGUAL | Status: DC | PRN
Start: 2017-11-15 — End: 2017-11-17

## 2017-11-15 MED FILL — SPIRIVA HANDIHALER 18 MCG IN CAPS: 18 ug | RESPIRATORY_TRACT | Qty: 5

## 2017-11-15 MED FILL — METOPROLOL TARTRATE 25 MG PO TABS: 25 mg | ORAL | Qty: 1

## 2017-11-15 MED FILL — HYDROXYZINE HCL 25 MG PO TABS: 25 mg | ORAL | Qty: 1

## 2017-11-15 MED FILL — LIPITOR 80 MG PO TABS: 80 mg | ORAL | Qty: 1

## 2017-11-15 MED FILL — MONTELUKAST SODIUM 10 MG PO TABS: 10 mg | ORAL | Qty: 1

## 2017-11-15 MED FILL — METFORMIN HCL 500 MG PO TABS: 500 mg | ORAL | Qty: 1

## 2017-11-15 MED FILL — LORAZEPAM 1 MG PO TABS: 1 mg | ORAL | Qty: 1

## 2017-11-15 MED FILL — NICOTINE STEP 2 14 MG/24HR TD PT24: 14 mg/(24.h) | TRANSDERMAL | Qty: 1

## 2017-11-15 MED FILL — ROPINIROLE HCL 1 MG PO TABS: 1 mg | ORAL | Qty: 4

## 2017-11-15 MED FILL — KCL IN DEXTROSE-NACL 20-5-0.45 MEQ/L-%-% IV SOLN: 20-5-0.45 MEQ/L-%-% | INTRAVENOUS | Qty: 1000

## 2017-11-15 MED FILL — CARBIDOPA-LEVODOPA 25-100 MG PO TABS: 25-100 mg | ORAL | Qty: 1

## 2017-11-15 MED FILL — LISINOPRIL 5 MG PO TABS: 5 mg | ORAL | Qty: 1

## 2017-11-15 MED FILL — MORPHINE SULFATE 4 MG/ML IJ SOLN: 4 mg/mL | INTRAMUSCULAR | Qty: 1

## 2017-11-15 MED FILL — SEROQUEL 100 MG PO TABS: 100 mg | ORAL | Qty: 1

## 2017-11-15 MED FILL — DESVENLAFAXINE SUCCINATE ER 50 MG PO TB24: 50 mg | ORAL | Qty: 2

## 2017-11-15 MED FILL — FLUTICASONE PROPIONATE 50 MCG/ACT NA SUSP: 50 MCG/ACT | NASAL | Qty: 16

## 2017-11-15 MED FILL — MORPHINE SULFATE 2 MG/ML IJ SOLN: 2 mg/mL | INTRAMUSCULAR | Qty: 1

## 2017-11-15 MED FILL — FAMOTIDINE 20 MG PO TABS: 20 mg | ORAL | Qty: 1

## 2017-11-15 MED FILL — ASPIRIN EC 325 MG PO TBEC: 325 mg | ORAL | Qty: 1

## 2017-11-15 MED FILL — PANTOPRAZOLE SODIUM 40 MG PO TBEC: 40 mg | ORAL | Qty: 1

## 2017-11-15 MED FILL — FOLIC ACID 1 MG PO TABS: 1 mg | ORAL | Qty: 1

## 2017-11-15 MED FILL — HUMALOG 100 UNIT/ML SC SOLN: 100 [IU]/mL | SUBCUTANEOUS | Qty: 12

## 2017-11-15 MED FILL — SENNA PLUS 8.6-50 MG PO TABS: 8.6-50 mg | ORAL | Qty: 1

## 2017-11-15 MED FILL — LEXISCAN 0.4 MG/5ML IV SOLN: 0.4 MG/5ML | INTRAVENOUS | Qty: 5

## 2017-11-15 MED FILL — ATORVASTATIN CALCIUM 40 MG PO TABS: 40 mg | ORAL | Qty: 1

## 2017-11-15 MED FILL — PRISTIQ 50 MG PO TB24: 50 mg | ORAL | Qty: 2

## 2017-11-15 MED FILL — LIPITOR 20 MG PO TABS: 20 mg | ORAL | Qty: 1

## 2017-11-15 MED FILL — HEPARIN SOD (PORCINE) IN D5W 100 UNIT/ML IV SOLN: 100 [IU]/mL | INTRAVENOUS | Qty: 250

## 2017-11-15 MED FILL — HEPARIN SODIUM (PORCINE) 1000 UNIT/ML IJ SOLN: 1000 [IU]/mL | INTRAMUSCULAR | Qty: 10

## 2017-11-15 NOTE — Progress Notes (Signed)
Smoking Cessation - topics covered   []   Health Risks  []   Benefits of Quitting   []   Smoking Cessation  []   Patient has no history of tobacco use  []   Patient is former smoker.     []   No need for tobacco cessation education.   []   Booklet given  []   Patient verbalizes understanding.  []   Patient denies need for tobacco cessation education.  [x]   Unable to meet with patient today despite several attempts.  Will follow up as able.  Kiyo Heal  2:42 PM

## 2017-11-15 NOTE — Progress Notes (Signed)
BRONCHOSPASM/BRONCHOCONSTRICTION     [x]         IMPROVE AERATION/BREATH SOUNDS  [x]   ADMINISTER BRONCHODILATOR THERAPY AS APPROPRIATE  [x]   ASSESS BREATH SOUNDS  [x]   PATIENT EDUCATION AS NEEDED

## 2017-11-15 NOTE — Progress Notes (Signed)
Pt had a K of 5.3 while in ED, fluids ordered were Dextrose 5% with 0.45% NS and 20 mEq of KCL, paged NP Malissa Delgrosso to see if they wanted these fluids continued or to stop and run something else. Pt has a recheck BMP with morning labs. Waiting for reply, will continue to monitor.

## 2017-11-15 NOTE — Procedures (Signed)
[facility]                  [address] [place], [place] [id number]                              CARDIAC STRESS TEST    PATIENT NAME: Shannon Allison, Shannon J                   DOB:        [birth year]  MED REC NO:   [id number]                             ROOM:       2004  ACCOUNT NO:   [id number]                           ADMIT DATE: 11/14/2017  PROVIDER:     Lamount Crankerarif Toshua Honsinger      LEXISCAN STRESS STUDY    DATE OF STUDY:  11/15/2017  ORDERING PROVIDER:  S. Ramiro HarvestWaterhouse, APRN-CNP  PRIMARY CARE PROVIDER:  D. Joselyn Glassmanyler, APRN-CNP  INDICATION: Chest pain  CONSENT:  The test was explained and consent was signed.    PROTOCOL: Lexiscan, 0.4 mg infused.  PREINFUSION EKG: Abnormal-normal sinus rhythm, old MI-inferior Q waves  noted.  PREINFUSION HR: 69 bpm, infusion HR, 97 bpm.  HR response to Lexiscan  was Normal.  PREINFUSION BP: 101/65 mmHg, infusion BP, 101/65 mmHg.  BP response to  Lexiscan was appropriate.  CHEST PAIN:  No chest discomfort with Lexiscan nor in recovery.  LEXISCAN EKG:  No changes were noted.  ISCHEMIC EKG CHANGES: None.    IMPRESSION:  Electrocardiographically negative Lexiscan stress study.  **Cardiolite report issued from the department of Nuclear Medicine**    Shannon Allison [facility]    D: 11/15/2017 13:08:20       T: 11/15/2017 13:09:42     TK/[id number]  Job#: [id number]     Doc#: Unknown

## 2017-11-15 NOTE — Plan of Care (Signed)
Pt continues to have chest pain.  Had stress test and Echo today.  Remains on Heparin drip.    Will continue to monitor.

## 2017-11-15 NOTE — Care Coordination-Inpatient (Addendum)
Case Management Initial Discharge Plan  Shannon Allison,             Met with:patient to discuss discharge plans.   Information verified: address, contacts, phone number, DOB, insurance Yes  PCP: Ayesha Mohair, APRN - CNP  Date of last visit: yesterday    Insurance Provider: Asher Muir    Discharge Planning    Living Arrangements:  (roomate in group home)   Support Systems:  Family Members, Friends/Neighbors, Parent    Home has 1 story  Patient able to perform ADL's:Independent    Current Services (outpatient & in home) none  DME equipment: glucometer  DME provider:     Pharmacy: [facility] Medications:  No  Does patient want to participate in local refill/ meds to beds program?  Yes    Potential Assistance Needed:  N/A    Patient agreeable to home care: No  Freedom of choice provided:  no    Prior SNF/Rehab Placement and Facility: none  Agreeable to SNF/Rehab: No  Freedom of choice provided: n/a  Social Services Evaluation: n/a    Expected Discharge date:  11/16/17  Patient expects to be discharged to: Returning to [place]  Follow Up Appointment: Best Day/ Time:      Transportation provider: cab  Transportation arrangements needed for discharge: Yes    Readmission Risk              Risk of Unplanned Readmission:        18             Does patient have a readmission risk score greater than 14?: Yes  If yes, follow-up appointment must be made within 7 days of discharge.     Discharge Plan: back to group home-[facility]          Electronically signed by Allena Earing, RN on 11/15/17 at 2:21 PM

## 2017-11-15 NOTE — H&P (Signed)
[facility]   IN-PATIENT SERVICE   [facility]    HISTORY AND PHYSICAL EXAMINATION            Date:   11/15/2017  Patient name:  Shannon Allison  Date of admission:  11/14/2017  2:11 PM  MRN:   [id number]  Account:  [id number]  Date of Birth:  1960-10-01  PCP:    Anise Salvo, APRN - CNP  Room:   2004/2004-01  Code Status:    Full Code    Chief Complaint:     Chief Complaint   Patient presents with   ??? Gastroesophageal Reflux   ??? Chest Pain     started around 9am      History Obtained From:     patient, electronic medical record    History of Present Illness:     The patient is a 57 y.o. female with a past medical history of Parkinson's disease, depression, insomnia, asthma, type 2 diabetes mellitus, and hypertension who presents to [facility] yesterday evening for chest pain.  Patient notes that her chest pain is been long-standing and ongoing.  However, yesterday her chest pain acutely became worse.  It is both right and left-sided chest pain.  It is reproducible to palpation.  She denies any nausea, vomiting, lightheadedness, cough, shortness of breath, diaphoresis, or myalgias associated with this chest pain.  She reports that she had an episode yesterday evening of worsening of her chest pain with subsequent presentation to the emergency department.  Cardiac work-up has been initiated and is been largely unremarkable.  EKG has been reviewed and is without any acute ischemic changes. Patient went for dobutamine stress test this morning with notable hypotension during the procedure.  She was subsequently returned to her room after IV fluid rehydration was initiated. It is of note that the patient received metoprolol and lisinopril late yesterday evening, along with morphine.  Patient to undergo nuclear part of scan later this evening.  The patient is a current everyday smoker, and denies any previous history of cardiac issues.    Past Medical History:     Past Medical History:   Diagnosis  Date   ??? Asthma    ??? COPD (chronic obstructive pulmonary disease) (HCC)    ??? Diabetes mellitus (HCC)    ??? Hyperlipidemia    ??? Hypertension    ??? Parkinson disease (HCC)    ??? Pneumonia 12/2016        Past Surgical History:     Past Surgical History:   Procedure Laterality Date   ??? NASAL SEPTUM SURGERY Bilateral    ??? PR COLON CA SCRN NOT HI [facility] IND  01/04/2016    COLONOSCOPY performed by Link Snuffer, MD at [facility]   ??? PR COLON CA SCRN NOT HI [facility] IND N/A 02/10/2016    COLONOSCOPY; incomplete; aborted due to poor prep performed by Link Snuffer, MD at [facility]   ??? PR ESOPHAGOGASTRODUODENOSCOPY TRANSORAL DIAGNOSTIC N/A 01/04/2016    EGD ESOPHAGOGASTRODUODENOSCOPY performed by Link Snuffer, MD at [facility]        Medications Prior to Admission:     Prior to Admission medications    Medication Sig Start Date End Date Taking? Authorizing Provider   LORazepam (ATIVAN) 1 MG tablet Take 1 mg by mouth every 8 hours as needed for Anxiety.   Yes Historical Provider, MD   rOPINIRole (REQUIP) 4 MG tablet Take 1 tablet  by mouth 3 times daily 09/20/17  Yes Foy Guadalajara, APRN - CNP   carbidopa-levodopa (SINEMET) 25-100 MG per tablet Take 1 tablet by mouth 3 times daily 09/20/17  Yes Foy Guadalajara, APRN - CNP   acetaminophen (TYLENOL) 325 MG tablet Take 2 tablets by mouth every 4 hours as needed for Pain or Fever 12/31/16  Yes Laray Anger, MD   hydrOXYzine (VISTARIL) 25 MG capsule Take 1 capsule by mouth 3 times daily as needed for Itching 12/31/16  Yes Laray Anger, MD   albuterol (PROVENTIL) (2.5 MG/3ML) 0.083% nebulizer solution Take 3 mLs by nebulization every 4 hours as needed for Wheezing or Shortness of Breath 12/31/16  Yes Laray Anger, MD   ipratropium-albuterol (DUONEB) 0.5-2.5 (3) MG/3ML SOLN nebulizer solution Inhale 3 mLs into the lungs every 4 hours as needed for Shortness of Breath 12/31/16  Yes Laray Anger, MD   montelukast (SINGULAIR) 10 MG tablet Take 1 tablet by mouth  nightly 12/31/16  Yes Laray Anger, MD   tiotropium (SPIRIVA HANDIHALER) 18 MCG inhalation capsule Inhale 1 capsule into the lungs daily 12/31/16  Yes Laray Anger, MD   desvenlafaxine succinate (PRISTIQ) 100 MG TB24 extended release tablet Take 1 tablet by mouth daily 12/31/16  Yes Laray Anger, MD   metFORMIN (GLUCOPHAGE) 500 MG tablet Take 1 tablet by mouth 2 times daily (with meals) 12/31/16  Yes Laray Anger, MD   atorvastatin (LIPITOR) 20 MG tablet Take 1 tablet by mouth daily 12/31/16  Yes Laray Anger, MD   lisinopril (PRINIVIL;ZESTRIL) 5 MG tablet Take 1 tablet by mouth daily 12/31/16  Yes Laray Anger, MD   QUEtiapine (SEROQUEL) 100 MG tablet Take 1 tablet by mouth nightly 12/31/16  Yes Laray Anger, MD   fluticasone (FLONASE) 50 MCG/ACT nasal spray 1 spray by Nasal route daily 12/31/16  Yes Laray Anger, MD   folic acid (FOLVITE) 1 MG tablet Take 1 tablet by mouth daily 12/31/16  Yes Laray Anger, MD   magnesium hydroxide (MILK OF MAGNESIA) 400 MG/5ML suspension Take 30 mLs by mouth daily as needed for Constipation 12/31/16  Yes Laray Anger, MD   sennosides-docusate sodium (SENOKOT-S) 8.6-50 MG tablet Take 1 tablet by mouth daily 12/31/16  Yes Laray Anger, MD   esomeprazole Magnesium (NEXIUM) 40 MG PACK Take 1 packet by mouth daily 12/31/16  Yes Laray Anger, MD   paliperidone palmitate (INVEGA SUSTENNA) 234 MG/1.5ML SUSP IM injection Inject 234 mg into the muscle every 30 days   Yes Historical Provider, MD        Allergies:     Patient has no known allergies.    Social History:     Tobacco:    reports that she has been smoking. She has been smoking about 0.25 packs per day. She has never used smokeless tobacco.  Alcohol:      reports that she does not drink alcohol.  Drug Use:  reports that she does not use drugs.    Family History:     Family History   Problem Relation Age of Onset   ??? High Blood Pressure Mother    ??? High Blood Pressure Father        Review of Systems:     Positive and  Negative as described in HPI.    CONSTITUTIONAL:  negative for fevers, chills, sweats, fatigue, weight loss  HEENT:  negative for vision, hearing changes, runny nose, throat pain  RESPIRATORY:  negative for shortness of breath, cough, congestion, wheezing.  CARDIOVASCULAR: Positive  for chest pain negative for palpitations  GASTROINTESTINAL:  negative for nausea, vomiting, diarrhea, constipation, change in bowel habits, abdominal pain   GENITOURINARY:  negative for difficulty of urination, burning with urination, frequency   INTEGUMENT:  negative for rash, skin lesions, easy bruising   HEMATOLOGIC/LYMPHATIC:  negative for swelling/edema   ALLERGIC/IMMUNOLOGIC:  negative for urticaria , itching  ENDOCRINE:  negative increase in drinking, increase in urination, hot or cold intolerance  MUSCULOSKELETAL:  negative joint pains, muscle aches, swelling of joints  NEUROLOGICAL: Positive for lightheadedness during hypotensive episode earlier today; negative for headaches, dizziness,  numbness, pain, tingling extremities   BEHAVIOR/PSYCH:  negative for depression, anxiety    Physical Exam:   BP (!) 82/50    Pulse 63    Temp 97.5 ??F (36.4 ??C) (Oral)    Resp 11    Ht 5\' 9"  (1.753 m)    Wt 200 lb 12.8 oz (91.1 kg)    SpO2 93%    BMI 29.65 kg/m??   Temp (24hrs), Avg:97.9 ??F (36.6 ??C), Min:97.5 ??F (36.4 ??C), Max:98.4 ??F (36.9 ??C)    Recent Labs     11/14/17  2149 11/15/17  0453 11/15/17  0659 11/15/17  1138   POCGLU 163* 117* 112* 120*       Intake/Output Summary (Last 24 hours) at 11/15/2017 1415  Last data filed at 11/15/2017 0657  Gross per 24 hour   Intake 1558.69 ml   Output --   Net 1558.69 ml     General Appearance:  alert, well appearing, and in no acute distress  Mental status: Awake and alert, with normal affect  Head:  normocephalic, atraumatic.  Eye: no icterus, redness, pupils equal and reactive, extraocular eye movements intact, conjunctiva clear  Nose:  no drainage noted  Mouth: mucous membranes moist  Neck: supple, no  carotid bruits, thyroid not palpable  Lungs: Bilateral equal air entry, clear to ausculation, no wheezing, rales or rhonchi, normal effort  Cardiovascular: normal rate, regular rhythm, no murmur, gallop, rub.  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, she has a low amplitude tremor of her bilateral upper extremities while she talks to me disappear when she stops talking to me  Skin: No gross lesions, rashes, bruising or bleeding on exposed skin area  Extremities:  peripheral pulses palpable, no pedal edema or calf pain with palpation  Psych: normal affect    Investigations:      Laboratory Testing:  Recent Results (from the past 24 hour(s))   EKG 12 Lead    Collection Time: 11/14/17  2:19 PM   Result Value Ref Range    Ventricular Rate 79 BPM    Atrial Rate 79 BPM    P-R Interval 156 ms    QRS Duration 94 ms    Q-T Interval 428 ms    QTc Calculation (Bazett) 490 ms    P Axis 46 degrees    R Axis -66 degrees    T Axis 26 degrees   CBC WITH AUTO DIFFERENTIAL    Collection Time: 11/14/17  2:24 PM   Result Value Ref Range    WBC 8.6 3.5 - 11.3 k/uL    RBC  4.67 3.95 - 5.11 m/uL    Hemoglobin 13.0 11.9 - 15.1 g/dL    Hematocrit 16.1 09.6 - 47.1 %    MCV 91.4 82.6 - 102.9 fL    MCH 27.8 25.2 - 33.5 pg    MCHC 30.4 28.4 - 34.8 g/dL    RDW 04.5 40.9 - 81.1 %    Platelets 313 138 - 453 k/uL    MPV 10.0 8.1 - 13.5 fL    NRBC Automated 0.0 0.0 per 100 WBC    Differential Type NOT REPORTED     Seg Neutrophils 68 (H) 36 - 65 %    Lymphocytes 19 (L) 24 - 43 %    Monocytes 12 3 - 12 %    Eosinophils % 1 1 - 4 %    Basophils 0 0 - 2 %     Immature Granulocytes 0 0 %    Segs Absolute 5.77 1.50 - 8.10 k/uL    Absolute Lymph # 1.62 1.10 - 3.70 k/uL    Absolute Mono # 0.99 0.10 - 1.20 k/uL    Absolute Eos # 0.11 0.00 - 0.44 k/uL    Basophils # 0.03 0.00 - 0.20 k/uL    Absolute Immature Granulocyte 0.03 0.00 - 0.30 k/uL    WBC Morphology NOT REPORTED     RBC Morphology NOT REPORTED     Platelet Estimate NOT REPORTED    Basic Metabolic Panel    Collection Time: 11/14/17  2:24 PM   Result Value Ref Range    Glucose 86 70 - 99 mg/dL    BUN 11 6 - 20 mg/dL    CREATININE 9.14 (L) 0.50 - 0.90 mg/dL    Bun/Cre Ratio NOT REPORTED 9 - 20    Calcium 9.3 8.6 - 10.4 mg/dL    Sodium 782 956 - 213 mmol/L    Potassium 5.3 3.7 - 5.3 mmol/L    Chloride 103 98 - 107 mmol/L    CO2 27 20 - 31 mmol/L    Anion Gap 12 9 - 17 mmol/L    GFR Non-African American >60 >60 mL/min    GFR African American >60 >60 mL/min    GFR Comment          GFR Staging NOT REPORTED    Troponin    Collection Time: 11/14/17  2:24 PM   Result Value Ref Range    Troponin, High Sensitivity <6 0 - 14 ng/L    Troponin T NOT REPORTED <0.03 ng/mL    Troponin Interp NOT REPORTED    Troponin    Collection Time: 11/14/17  4:22 PM   Result Value Ref Range    Troponin, High Sensitivity <6 0 - 14 ng/L    Troponin T NOT REPORTED <0.03 ng/mL    Troponin Interp NOT REPORTED    EKG 12 Lead    Collection Time: 11/14/17  5:55 PM   Result Value Ref Range    Ventricular Rate 68 BPM    Atrial Rate 68 BPM    P-R Interval 142 ms    QRS Duration 96 ms    Q-T Interval 460 ms    QTc Calculation (Bazett) 489 ms    P Axis 55 degrees    R Axis -12 degrees    T Axis 32 degrees   Troponin    Collection Time: 11/14/17  6:31 PM   Result Value Ref Range    Troponin, High Sensitivity <6 0 - 14 ng/L    Troponin  T NOT REPORTED <0.03 ng/mL    Troponin Interp NOT REPORTED    Troponin    Collection Time: 11/14/17  8:48 PM   Result Value Ref Range    Troponin, High Sensitivity <6 0 - 14 ng/L    Troponin T NOT REPORTED <0.03 ng/mL    Troponin  Interp NOT REPORTED    Hemoglobin A1c    Collection Time: 11/14/17  8:48 PM   Result Value Ref Range    Hemoglobin A1C 6.3 (H) 4.0 - 6.0 %    Estimated Avg Glucose 134 mg/dL   CBC    Collection Time: 11/14/17  8:48 PM   Result Value Ref Range    WBC 9.2 3.5 - 11.3 k/uL    RBC 4.56 3.95 - 5.11 m/uL    Hemoglobin 13.0 11.9 - 15.1 g/dL    Hematocrit 30.841.5 65.736.3 - 47.1 %    MCV 91.0 82.6 - 102.9 fL    MCH 28.5 25.2 - 33.5 pg    MCHC 31.3 28.4 - 34.8 g/dL    RDW 84.612.9 96.211.8 - 95.214.4 %    Platelets 319 138 - 453 k/uL    MPV 10.5 8.1 - 13.5 fL    NRBC Automated 0.0 0.0 per 100 WBC   APTT    Collection Time: 11/14/17  8:48 PM   Result Value Ref Range    PTT 23.9 20.5 - 30.5 sec   POC Glucose Fingerstick    Collection Time: 11/14/17  9:10 PM   Result Value Ref Range    POC Glucose 127 (H) 65 - 105 mg/dL   POC Glucose Fingerstick    Collection Time: 11/14/17  9:49 PM   Result Value Ref Range    POC Glucose 163 (H) 65 - 105 mg/dL   Comprehensive Metabolic Panel w/ Reflex to MG    Collection Time: 11/15/17  2:40 AM   Result Value Ref Range    Glucose 146 (H) 70 - 99 mg/dL    BUN 10 6 - 20 mg/dL    CREATININE 8.410.41 (L) 0.50 - 0.90 mg/dL    Bun/Cre Ratio NOT REPORTED 9 - 20    Calcium 8.7 8.6 - 10.4 mg/dL    Sodium 324136 401135 - 027144 mmol/L    Potassium 3.8 3.7 - 5.3 mmol/L    Chloride 99 98 - 107 mmol/L    CO2 27 20 - 31 mmol/L    Anion Gap 10 9 - 17 mmol/L    Alkaline Phosphatase 82 35 - 104 U/L    ALT 18 5 - 33 U/L    AST 13 <32 U/L    Total Bilirubin 0.21 (L) 0.3 - 1.2 mg/dL    Total Protein 6.6 6.4 - 8.3 g/dL    Alb 3.6 3.5 - 5.2 g/dL    Albumin/Globulin Ratio 1.2 1.0 - 2.5    GFR Non-African American >60 >60 mL/min    GFR African American >60 >60 mL/min    GFR Comment          GFR Staging NOT REPORTED    Magnesium    Collection Time: 11/15/17  2:40 AM   Result Value Ref Range    Magnesium 2.0 1.6 - 2.6 mg/dL   Brain natriuretic peptide    Collection Time: 11/15/17  2:40 AM   Result Value Ref Range    Pro-BNP <20 <300 pg/mL    BNP  Interpretation Pro-BNP Reference Range:    Lipid panel - fasting    Collection Time:  11/15/17  2:40 AM   Result Value Ref Range    Cholesterol 124 <200 mg/dL    HDL 50 >16 mg/dL    LDL Cholesterol 61 0 - 130 mg/dL    Chol/HDL Ratio 2.5 <5    Triglycerides 65 <150 mg/dL    VLDL NOT REPORTED 1 - 30 mg/dL   CBC    Collection Time: 11/15/17  2:40 AM   Result Value Ref Range    WBC 7.1 3.5 - 11.3 k/uL    RBC 4.20 3.95 - 5.11 m/uL    Hemoglobin 12.0 11.9 - 15.1 g/dL    Hematocrit 10.9 60.4 - 47.1 %    MCV 91.4 82.6 - 102.9 fL    MCH 28.6 25.2 - 33.5 pg    MCHC 31.3 28.4 - 34.8 g/dL    RDW 54.0 98.1 - 19.1 %    Platelets 300 138 - 453 k/uL    MPV 10.1 8.1 - 13.5 fL    NRBC Automated 0.0 0.0 per 100 WBC   APTT    Collection Time: 11/15/17  2:40 AM   Result Value Ref Range    PTT 33.3 (H) 20.5 - 30.5 sec   Troponin    Collection Time: 11/15/17  2:40 AM   Result Value Ref Range    Troponin, High Sensitivity <6 0 - 14 ng/L    Troponin T NOT REPORTED <0.03 ng/mL    Troponin Interp NOT REPORTED    POC Glucose Fingerstick    Collection Time: 11/15/17  4:53 AM   Result Value Ref Range    POC Glucose 117 (H) 65 - 105 mg/dL   POC Glucose Fingerstick    Collection Time: 11/15/17  6:59 AM   Result Value Ref Range    POC Glucose 112 (H) 65 - 105 mg/dL   POC Glucose Fingerstick    Collection Time: 11/15/17 11:38 AM   Result Value Ref Range    POC Glucose 120 (H) 65 - 105 mg/dL       Imaging/Diagnostics:  Xr Chest Standard (2 Vw)    Result Date: 11/14/2017  No acute cardiopulmonary pathology.       Assessment :      Hospital Problems           Last Modified POA    * (Principal) Atypical chest pain 11/15/2017 Yes    Essential hypertension 11/15/2017 Yes    Hypotension due to drugs 11/15/2017 No    Type 2 diabetes mellitus, without long-term current use of insulin (HCC) 11/15/2017 Yes    Gastroesophageal reflux disease without esophagitis 11/15/2017 Yes    Uncomplicated asthma 11/15/2017 Yes    Parkinson's disease (HCC) 11/15/2017 Yes        Plan:      Patient status Admit as inpatient in the  Progressive Unit/Step down    1. Appreciate cardiology input  2. Continue heparin infusion at present, discontinue if stress test/nuclear scan is negative  3. Continue home medications as written for  4. Change atorvastatin to from 80 mg nightly to 40 mg nightly as patient has well-controlled cholesterol levels  5. Continue aspirin  6. Discontinue Protonix, continue Pepcid BID  7. Discontinue metformin while in hospital, continue insulin sliding scale for hyperglycemia correction  8. Discontinue D5 half-normal saline, replace with normal saline as patient is hypotensive  9. Hypotensive episode likely related to med effect -we will continue to monitor her vital signs for the next 24 hours. If she remains normotensive and if  stress tests are negative, patient may be safely discharged home  10. Hold parameters placed on antihypertensives  11. Nicotine patch supplied  12. Bowel regimen  13. Toradol for pain control  14. Activity as tolerated    Consultations:   IP CONSULT TO CARDIOLOGY  IP CONSULT TO HOSPITALIST  IP CONSULT TO CARDIOLOGY  IP CONSULT TO CARDIOLOGY    Patient is admitted as inpatient status because of co-morbidities listed above, severity of signs and symptoms as outlined, requirement for current medical therapies and most importantly because of direct risk to patient if care not provided in a hospital setting.    Janathan Bribiesca, DO  11/15/2017  2:15 PM    Copy sent to Dr. Anise Salvo, APRN - CNP

## 2017-11-15 NOTE — Progress Notes (Signed)
Patient completed lexiscan stress test. BP after 2 min decreased to 79/60. Patient states she feel light headed. Skin warm and dry. HR 89 NSR.    Marland Kitchen.9NS started at 500 ml/hr    1045 BP= 79/60 .9NS iv started at 500ml.  1100 BP 98/60  1110 BP 82/50. Denies chest pain. Complains of a headache.    Phoned floor nurse Mindi JunkerMarsha and updated. Patient returned to room per stretch without incident. Nuc Med will call later to finish remainder of stress test.

## 2017-11-15 NOTE — Progress Notes (Signed)
Pt had a 14-beat run of what looks like a bundle change on EKG.  MD notified.  Orders received.

## 2017-11-16 LAB — POC GLUCOSE FINGERSTICK
POC Glucose: 99 mg/dL (ref 65–105)
POC Glucose: 104 mg/dL (ref 65–105)
POC Glucose: 104 mg/dL (ref 65–105)
POC Glucose: 103 mg/dL (ref 65–105)
POC Glucose: 102 mg/dL (ref 65–105)
POC Glucose: 188 mg/dL — ABNORMAL HIGH (ref 65–105)

## 2017-11-16 LAB — TROPONIN: Troponin, High Sensitivity: <6 ng/L (ref 0–14)

## 2017-11-16 LAB — EKG 12-LEAD
Atrial Rate: 80 {beats}/min
P Axis: 54 degrees
P-R Interval: 140 ms
Q-T Interval: 420 ms
QRS Duration: 100 ms
QTc Calculation (Bazett): 484 ms
R Axis: 19 degrees
T Axis: 40 degrees
Ventricular Rate: 80 {beats}/min

## 2017-11-16 LAB — POTASSIUM: Potassium: 4.4 mmol/L (ref 3.7–5.3)

## 2017-11-16 LAB — APTT
APTT: 36.3 s — ABNORMAL HIGH (ref 20.5–30.5)
APTT: 47 s — ABNORMAL HIGH (ref 20.5–30.5)
APTT: 27 s (ref 20.5–30.5)

## 2017-11-16 LAB — MAGNESIUM: Magnesium: 2.1 mg/dL (ref 1.6–2.6)

## 2017-11-16 MED ORDER — POLYETHYLENE GLYCOL 3350 17 G PO PACK
17 g | Freq: Two times a day (BID) | ORAL | Status: DC
Start: 2017-11-16 — End: 2017-11-17
  Administered 2017-11-17 (×2): 17 g via ORAL

## 2017-11-16 MED ORDER — KETOROLAC TROMETHAMINE 15 MG/ML IJ SOLN
15 MG/ML | Freq: Once | INTRAMUSCULAR | Status: AC
Start: 2017-11-16 — End: 2017-11-16
  Administered 2017-11-16: 18:00:00 15 mg via INTRAVENOUS

## 2017-11-16 MED ORDER — KETOROLAC TROMETHAMINE 15 MG/ML IJ SOLN
15 MG/ML | Freq: Four times a day (QID) | INTRAMUSCULAR | Status: DC | PRN
Start: 2017-11-16 — End: 2017-11-17
  Administered 2017-11-17 (×3): 15 mg via INTRAVENOUS

## 2017-11-16 MED FILL — ROPINIROLE HCL 1 MG PO TABS: 1 mg | ORAL | Qty: 4

## 2017-11-16 MED FILL — HYDROXYZINE HCL 25 MG PO TABS: 25 mg | ORAL | Qty: 1

## 2017-11-16 MED FILL — HEPARIN SOD (PORCINE) IN D5W 100 UNIT/ML IV SOLN: 100 [IU]/mL | INTRAVENOUS | Qty: 250

## 2017-11-16 MED FILL — LISINOPRIL 5 MG PO TABS: 5 mg | ORAL | Qty: 1

## 2017-11-16 MED FILL — ATORVASTATIN CALCIUM 40 MG PO TABS: 40 mg | ORAL | Qty: 1

## 2017-11-16 MED FILL — KETOROLAC TROMETHAMINE 15 MG/ML IJ SOLN: 15 mg/mL | INTRAMUSCULAR | Qty: 1

## 2017-11-16 MED FILL — METOPROLOL TARTRATE 25 MG PO TABS: 25 mg | ORAL | Qty: 1

## 2017-11-16 MED FILL — PRISTIQ 50 MG PO TB24: 50 mg | ORAL | Qty: 2

## 2017-11-16 MED FILL — MORPHINE SULFATE 2 MG/ML IJ SOLN: 2 mg/mL | INTRAMUSCULAR | Qty: 1

## 2017-11-16 MED FILL — CARBIDOPA-LEVODOPA 25-100 MG PO TABS: 25-100 mg | ORAL | Qty: 1

## 2017-11-16 MED FILL — ASPIRIN EC 325 MG PO TBEC: 325 mg | ORAL | Qty: 1

## 2017-11-16 MED FILL — FAMOTIDINE 20 MG PO TABS: 20 mg | ORAL | Qty: 1

## 2017-11-16 MED FILL — MONTELUKAST SODIUM 10 MG PO TABS: 10 mg | ORAL | Qty: 1

## 2017-11-16 MED FILL — FOLIC ACID 1 MG PO TABS: 1 mg | ORAL | Qty: 1

## 2017-11-16 MED FILL — HEALTHYLAX 17 G PO PACK: 17 g | ORAL | Qty: 1

## 2017-11-16 MED FILL — SENNA PLUS 8.6-50 MG PO TABS: 8.6-50 mg | ORAL | Qty: 1

## 2017-11-16 MED FILL — SEROQUEL 100 MG PO TABS: 100 mg | ORAL | Qty: 1

## 2017-11-16 MED FILL — LORAZEPAM 1 MG PO TABS: 1 mg | ORAL | Qty: 1

## 2017-11-16 MED FILL — NICOTINE STEP 2 14 MG/24HR TD PT24: 14 mg/(24.h) | TRANSDERMAL | Qty: 1

## 2017-11-16 MED FILL — SPIRIVA HANDIHALER 18 MCG IN CAPS: 18 ug | RESPIRATORY_TRACT | Qty: 5

## 2017-11-16 NOTE — Progress Notes (Signed)
Smoking Cessation - topics covered   [x]   Health Risks  [x]   Benefits of Quitting   [x]   Smoking Cessation  []   Patient has no history of tobacco use  []   Patient is former smoker.      []   No need for tobacco cessation education.   [x]   Booklet given (declined)  [x]   Patient verbalizes understanding.  []   Patient denies need for tobacco cessation education.  []   Unable to meet with patient today.  Will follow up as able.  Shannon Allison Rishita Petron  3:19 PM

## 2017-11-16 NOTE — Plan of Care (Signed)
BRONCHOSPASM/BRONCHOCONSTRICTION     [x]         IMPROVE AERATION/BREATH SOUNDS  [x]   ADMINISTER BRONCHODILATOR THERAPY AS APPROPRIATE  [x]   ASSESS BREATH SOUNDS  []   IMPLEMENT AEROSOL/MDI PROTOCOL  [x]   PATIENT EDUCATION AS NEEDED

## 2017-11-16 NOTE — Progress Notes (Signed)
[facility]   IN-PATIENT SERVICE   [facility]    Progress Note    11/16/2017    10:03 AM    Name:   Renae Glosslizabeth J Biegel  MRN:     [id number]     Acct:      [id number]   Room:   00110011002004/2004-01  IP Day:  2  Admit Date:  11/14/2017  2:11 PM    PCP:   Anise Salvoeborah S Tyler, APRN - CNP  Code Status:  Full Code    Subjective:     C/C:   Chief Complaint   Patient presents with   ??? Gastroesophageal Reflux   ??? Chest Pain     started around 9am      Interval History Status: not changed.     Pt seen and examined this morning on rounds. She is awake and alert, lying in bed. No acute events overnight. She continues to complain of 5/10 chest pain that is persistent in nature. It is reproducible to palpation. She also complains of constipation, but did have a bowel movement last night. Denies nausea, vomiting, fevers, chills, shortness of breath.    Brief History:     ??  The patient is a 57 y.o. female with a PMH notable for recently diagnosed Parkinson's disease, depression, insomnia, asthma, type 2 diabetes mellitus, and hypertension who presented to [facility] ED on 8/7 for chest pain.  Patient notes that her chest pain is been long-standing and ongoing.  However, yesterday her chest pain acutely became worse.  It is both right and left-sided chest pain.  It is reproducible to palpation.  She denies any nausea, vomiting, lightheadedness, cough, shortness of breath, diaphoresis, or myalgias associated with this chest pain.  She reported that she had an episode worsening of her chest pain with subsequent presentation to the ED for workup.  Cardiac work-up has been initiated and reveals abnormalities on her nuclear imaging.  EKG has been reviewed and is without any acute ischemic changes. Pt with nonstustained V-tach as well overnight on 8/9. Cardiology to determine intervention vs discharge.    Review of Systems:     Constitutional:  negative for chills, fevers, sweats  Respiratory:  negative for cough, dyspnea on  exertion, hemoptysis, shortness of breath, wheezing  Cardiovascular:  Positive for chest pain; denies lower extremity edema, palpitations  Gastrointestinal: positive for constipation; negative for abdominal pain, diarrhea, nausea, vomiting  Neurological:  negative for dizziness, headache    Medications:     Allergies:  No Known Allergies    Current Meds:   Scheduled Meds:   ??? nicotine  1 patch Transdermal Daily   ??? atorvastatin  40 mg Oral Nightly   ??? carbidopa-levodopa  1 tablet Oral TID   ??? desvenlafaxine succinate  100 mg Oral Daily   ??? fluticasone  1 spray Nasal Daily   ??? folic acid  1 mg Oral Daily   ??? lisinopril  5 mg Oral Daily   ??? montelukast  10 mg Oral Nightly   ??? QUEtiapine  100 mg Oral Nightly   ??? rOPINIRole  4 mg Oral TID   ??? sennosides-docusate sodium  1 tablet Oral Daily   ??? tiotropium  18 mcg Inhalation Daily   ??? sodium chloride flush  10 mL Intravenous 2 times per day   ??? sodium chloride flush  10 mL Intravenous 2 times per day   ??? famotidine  20 mg Oral BID   ??? aspirin  324 mg Oral Once   ??? aspirin  325 mg Oral Daily   ??? metoprolol tartrate  25 mg Oral BID   ??? insulin lispro  0-12 Units Subcutaneous Q4H     Continuous Infusions:   ??? sodium chloride 100 mL/hr at 11/16/17 0651   ??? dextrose     ??? heparin (porcine) 1,550 Units/hr (11/16/17 0005)     PRN Meds: sodium chloride flush, sodium chloride flush, albuterol, hydrOXYzine, ipratropium-albuterol, sodium chloride flush, potassium chloride **OR** potassium alternative oral replacement **OR** potassium chloride, magnesium sulfate, magnesium hydroxide, ondansetron, glucose, dextrose, glucagon (rDNA), dextrose, albuterol, nitroGLYCERIN, acetaminophen, heparin (porcine), heparin (porcine), morphine **OR** morphine, LORazepam    Data:     Past Medical History:   has a past medical history of Asthma, COPD (chronic obstructive pulmonary disease) (HCC), Diabetes mellitus (HCC), Hyperlipidemia, Hypertension, Parkinson disease (HCC), and Pneumonia.    Social  History:   reports that she has been smoking. She has been smoking about 0.25 packs per day. She has never used smokeless tobacco. She reports that she does not drink alcohol or use drugs.     Family History:   Family History   Problem Relation Age of Onset   ??? High Blood Pressure Mother    ??? High Blood Pressure Father        Vitals:  BP (!) 158/98    Pulse 64    Temp 97.7 ??F (36.5 ??C) (Oral)    Resp 13    Ht 5\' 9"  (1.753 m)    Wt 200 lb 12.8 oz (91.1 kg)    SpO2 95%    BMI 29.65 kg/m??   Temp (24hrs), Avg:98 ??F (36.7 ??C), Min:97.4 ??F (36.3 ??C), Max:98.7 ??F (37.1 ??C)    Recent Labs     11/15/17  2121 11/16/17  0230 11/16/17  0521 11/16/17  0654   POCGLU 99 104 104 103       I/O (24Hr):    Intake/Output Summary (Last 24 hours) at 11/16/2017 1003  Last data filed at 11/16/2017 1610  Gross per 24 hour   Intake 3204.67 ml   Output --   Net 3204.67 ml       Labs:  Hematology:  Recent Labs     11/14/17  1424 11/14/17  2048 11/15/17  0240   WBC 8.6 9.2 7.1   RBC 4.67 4.56 4.20   HGB 13.0 13.0 12.0   HCT 42.7 41.5 38.4   MCV 91.4 91.0 91.4   MCH 27.8 28.5 28.6   MCHC 30.4 31.3 31.3   RDW 13.0 12.9 13.1   PLT 313 319 300   MPV 10.0 10.5 10.1     Chemistry:  Recent Labs     11/14/17  1424  11/14/17  2048 11/15/17  0240 11/15/17  2307   NA 142  --   --  136  --    K 5.3  --   --  3.8 4.4   CL 103  --   --  99  --    CO2 27  --   --  27  --    GLUCOSE 86  --   --  146*  --    BUN 11  --   --  10  --    CREATININE 0.48*  --   --  0.41*  --    MG  --   --   --  2.0 2.1   ANIONGAP 12  --   --  10  --    LABGLOM >60  --   --  >60  --    GFRAA >60  --   --  >60  --    CALCIUM 9.3  --   --  8.7  --    PROBNP  --   --   --  <20  --    TROPHS <6   < > <6 <6 <6    < > = values in this interval not displayed.     Recent Labs     11/14/17  2048  11/15/17  0240  11/15/17  1138 11/15/17  1639 11/15/17  2121 11/16/17  0230 11/16/17  0521 11/16/17  0654   PROT  --   --  6.6  --   --   --   --   --   --   --    LABALBU  --   --  3.6  --   --   --    --   --   --   --    LABA1C 6.3*  --   --   --   --   --   --   --   --   --    AST  --   --  13  --   --   --   --   --   --   --    ALT  --   --  18  --   --   --   --   --   --   --    ALKPHOS  --   --  82  --   --   --   --   --   --   --    BILITOT  --   --  0.21*  --   --   --   --   --   --   --    CHOL  --   --  124  --   --   --   --   --   --   --    HDL  --   --  50  --   --   --   --   --   --   --    LDLCHOLESTEROL  --   --  61  --   --   --   --   --   --   --    CHOLHDLRATIO  --   --  2.5  --   --   --   --   --   --   --    TRIG  --   --  65  --   --   --   --   --   --   --    VLDL  --   --  NOT REPORTED  --   --   --   --   --   --   --    POCGLU  --    < >  --    < > 120* 160* 99 104 104 103    < > = values in this interval not displayed.     ABG:  Lab Results   Component Value Date    FIO2 NOT REPORTED 12/26/2016     Lab Results   Component Value Date/Time    SPECIAL NOT REPORTED 09/20/2016 05:57  PM     Lab Results   Component Value Date/Time    CULTURE NO SIGNIFICANT GROWTH 09/20/2016 05:57 PM    CULTURE  09/20/2016 05:57 PM     [facility] [address], [place] [address] [phone]       Radiology:  Xr Chest Standard (2 Vw)    Result Date: 11/14/2017  No acute cardiopulmonary pathology.     Nm Cardiac Stress Test Nuclear Imaging    Result Date: 11/15/2017  Normal study. Risk stratification: Low       Physical Examination:        General appearance:  alert, cooperative and no distress  Mental Status:  A&Ox3 and normal affect  Lungs:  clear to auscultation bilaterally, normal effort  Chest Wall: palpation of anterior chest wall reproduces the patient's chest pain  Heart:  regular rate and rhythm, no murmurs, rubs, or gallops  Abdomen:  soft, nontender, protuberant abdomen, nondistended, normal bowel sounds, no masses, hepatomegaly, splenomegaly  Extremities:  no edema, redness, tenderness in the calves  Skin:  no gross lesions, rashes, induration    Assessment:        Hospital Problems            Last Modified POA    * (Principal) Atypical chest pain 11/15/2017 Yes    Essential hypertension 11/15/2017 Yes    Hypotension due to drugs 11/15/2017 No    Prolonged QT interval 11/16/2017 Yes    Nonsustained ventricular tachycardia (HCC) 11/16/2017 No    Type 2 diabetes mellitus, without long-term current use of insulin (HCC) 11/15/2017 Yes    Gastroesophageal reflux disease without esophagitis 11/15/2017 Yes    Uncomplicated asthma 11/15/2017 Yes    Parkinson's disease (HCC) 11/15/2017 Yes          Plan:        1. Appreciate cardiology's input - cath vs home  2. Monitor telemetry - patient with nonstustained VT overnight with known prolonged QTc (484)  3. One time dose of toradol to see if this alleviates patient's pain  4. If toradol ineffective, will try one time of muscle relaxant  5. Continue heparin infusion at present, d/c when able  6. Close attention to BP - hypotensive episode yesterday morning likely 2/2 med effect  7. Possible discharge this afternoon, pending cardiology recommendations    Luda Charbonneau, DO  11/16/2017  10:03 AM

## 2017-11-16 NOTE — Other (Signed)
Visited as part of routine rounding.    Patient stated she received good news regarding tests she had done.  Patient was smiling and very encouraged by the results.    Chaplain provided a presence and active, empathetic and compassionate listening.     Patient engaged chaplain in conversation regarding her health challenges, and plans for the future.  Patient expressed gratitude to chaplain for visit.    Chaplains remain available for spiritual and emotional support as needed.     11/16/17 0849   Encounter Summary   Services provided to: Patient   Referral/Consult From: Rounding   Continue Visiting   (11/16/17)   Complexity of Encounter Low   Length of Encounter 15 minutes   Spiritual Assessment Completed Yes   Routine   Type Initial   Assessment Calm;Approachable   Intervention Active listening;Prayer   Outcome Expressed gratitude

## 2017-11-16 NOTE — Plan of Care (Signed)
Problem: Falls - Risk of:  Goal: Will remain free from falls  Description  Will remain free from falls  11/16/2017 0547 by Lizbeth BarkMorgan L Marshun Duva, RN  Outcome: Ongoing  Note:   High Fall Protocols in place  Bed alarm on  Bed in lowest position  Pt free from falls throughout shift  Will continue to monitor  11/15/2017 1921 by Nicola PoliceMarcia Voigt, RN  Outcome: Ongoing

## 2017-11-16 NOTE — Progress Notes (Addendum)
All labs back, K and Mag WNL, Trop <6, PTT 36.3, so heparin protocol was followed, 12 lead was completed earlier, which showed NSR but a slight prolonged QT at 420 ms. Paged NP Marylin CrosbyJanice Calfee with all results. Pt is resting comfortably and is having no CP currently. Will continue to monitor pt.

## 2017-11-16 NOTE — Plan of Care (Signed)
Pt continues to have chest pain.  Toradol given with moderate relief.  MD aware.  Pt has remained free of falls or injury since admission.  Heparin drip continues.    Will continue to monitor.

## 2017-11-16 NOTE — Consults (Addendum)
[facility] Cardiology    Inpatient Consultation Note               Today's Date: 11/16/2017  Patient Name: Shannon Allison  Date of admission: 11/14/2017  2:11 PM  Patient's age: 57 y.o., 10-06-1960  Admission Dx: Chest pain [R07.9]  Chest pain [R07.9]    Reason for  Consult:  Non sustained V-tach    Requesting Physician: Stephanie Coup Hauger, DO    CHIEF COMPLAINT:     Chief Complaint   Patient presents with   ??? Gastroesophageal Reflux   ??? Chest Pain     started around 9am        History Obtained From:  patient, electronic medical record    HISTORY OF PRESENT ILLNESS:      The patient is a 57 y.o. Caucasian female who is admitted to the hospital for chest pain. Primary team ordered stress test and it was negative. During admission 7 runs of V-tach on telemetry so cardiology team got consulted.     She does complain of palpitations but no syncope.    EKG was done and it showed NSR with prolonged QT. Electrolytes showed K 3.7 and MAG 2.1    She has history of depression and parkinson. She is on seroquel and ropinrole.    Currently he is on metoprolol 25 mg BID and lisinopril 5 mg.         Past Medical History:   has a past medical history of Asthma, COPD (chronic obstructive pulmonary disease) (HCC), Diabetes mellitus (HCC), Hyperlipidemia, Hypertension, Parkinson disease (HCC), and Pneumonia.    Past Surgical History:   has a past surgical history that includes Nasal septum surgery (Bilateral); pr esophagogastroduodenoscopy transoral diagnostic (N/A, 01/04/2016); pr colon ca scrn not hi rsk ind (01/04/2016); and pr colon ca scrn not hi rsk ind (N/A, 02/10/2016).     Home Medications:    Prior to Admission medications    Medication Sig Start Date End Date Taking? Authorizing Provider   LORazepam (ATIVAN) 1 MG tablet Take 1 mg by mouth every 8 hours as needed for Anxiety.   Yes Historical Provider, MD   rOPINIRole (REQUIP) 4 MG tablet Take 1 tablet by mouth 3 times daily 09/20/17  Yes Foy Guadalajara, APRN - CNP    carbidopa-levodopa (SINEMET) 25-100 MG per tablet Take 1 tablet by mouth 3 times daily 09/20/17  Yes Foy Gu[place]ara, APRN - CNP   acetaminophen (TYLENOL) 325 MG tablet Take 2 tablets by mouth every 4 hours as needed for Pain or Fever 12/31/16  Yes Laray Anger, MD   hydrOXYzine (VISTARIL) 25 MG capsule Take 1 capsule by mouth 3 times daily as needed for Itching 12/31/16  Yes Laray Anger, MD   albuterol (PROVENTIL) (2.5 MG/3ML) 0.083% nebulizer solution Take 3 mLs by nebulization every 4 hours as needed for Wheezing or Shortness of Breath 12/31/16  Yes Laray Anger, MD   ipratropium-albuterol (DUONEB) 0.5-2.5 (3) MG/3ML SOLN nebulizer solution Inhale 3 mLs into the lungs every 4 hours as needed for Shortness of Breath 12/31/16  Yes Laray Anger, MD   montelukast (SINGULAIR) 10 MG tablet Take 1 tablet by mouth nightly 12/31/16  Yes Laray Anger, MD   tiotropium (SPIRIVA HANDIHALER) 18 MCG inhalation capsule Inhale 1 capsule into the lungs daily 12/31/16  Yes Laray Anger, MD   desvenlafaxine succinate (PRISTIQ) 100 MG TB24 extended release tablet Take 1 tablet by mouth daily 12/31/16  Yes Laray Anger, MD   metFORMIN (  GLUCOPHAGE) 500 MG tablet Take 1 tablet by mouth 2 times daily (with meals) 12/31/16  Yes Laray Anger, MD   atorvastatin (LIPITOR) 20 MG tablet Take 1 tablet by mouth daily 12/31/16  Yes Laray Anger, MD   lisinopril (PRINIVIL;ZESTRIL) 5 MG tablet Take 1 tablet by mouth daily 12/31/16  Yes Laray Anger, MD   QUEtiapine (SEROQUEL) 100 MG tablet Take 1 tablet by mouth nightly 12/31/16  Yes Laray Anger, MD   fluticasone (FLONASE) 50 MCG/ACT nasal spray 1 spray by Nasal route daily 12/31/16  Yes Laray Anger, MD   folic acid (FOLVITE) 1 MG tablet Take 1 tablet by mouth daily 12/31/16  Yes Laray Anger, MD   magnesium hydroxide (MILK OF MAGNESIA) 400 MG/5ML suspension Take 30 mLs by mouth daily as needed for Constipation 12/31/16  Yes Laray Anger, MD   sennosides-docusate sodium  (SENOKOT-S) 8.6-50 MG tablet Take 1 tablet by mouth daily 12/31/16  Yes Laray Anger, MD   esomeprazole Magnesium (NEXIUM) 40 MG PACK Take 1 packet by mouth daily 12/31/16  Yes Laray Anger, MD   paliperidone palmitate (INVEGA SUSTENNA) 234 MG/1.5ML SUSP IM injection Inject 234 mg into the muscle every 30 days   Yes Historical Provider, MD        Current Facility-Administered Medications: sodium chloride flush 0.9 % injection 10 mL, 10 mL, Intravenous, PRN  sodium chloride flush 0.9 % injection 10 mL, 10 mL, Intravenous, PRN  nicotine (NICODERM CQ) 14 MG/24HR 1 patch, 1 patch, Transdermal, Daily  atorvastatin (LIPITOR) tablet 40 mg, 40 mg, Oral, Nightly  0.9 % sodium chloride infusion, , Intravenous, Continuous  albuterol (PROVENTIL) nebulizer solution 2.5 mg, 2.5 mg, Nebulization, Q4H PRN  carbidopa-levodopa (SINEMET) 25-100 MG per tablet 1 tablet, 1 tablet, Oral, TID  desvenlafaxine succinate (PRISTIQ) extended release tablet 100 mg, 100 mg, Oral, Daily  fluticasone (FLONASE) 50 MCG/ACT nasal spray 1 spray, 1 spray, Nasal, Daily  folic acid (FOLVITE) tablet 1 mg, 1 mg, Oral, Daily  hydrOXYzine (ATARAX) tablet 25 mg, 25 mg, Oral, TID PRN  ipratropium-albuterol (DUONEB) nebulizer solution 3 mL, 3 mL, Inhalation, Q4H PRN  lisinopril (PRINIVIL;ZESTRIL) tablet 5 mg, 5 mg, Oral, Daily  montelukast (SINGULAIR) tablet 10 mg, 10 mg, Oral, Nightly  QUEtiapine (SEROQUEL) tablet 100 mg, 100 mg, Oral, Nightly  rOPINIRole (REQUIP) tablet 4 mg, 4 mg, Oral, TID  sennosides-docusate sodium (SENOKOT-S) 8.6-50 MG tablet 1 tablet, 1 tablet, Oral, Daily  tiotropium (SPIRIVA) inhalation capsule 18 mcg, 18 mcg, Inhalation, Daily  sodium chloride flush 0.9 % injection 10 mL, 10 mL, Intravenous, 2 times per day  sodium chloride flush 0.9 % injection 10 mL, 10 mL, Intravenous, 2 times per day  sodium chloride flush 0.9 % injection 10 mL, 10 mL, Intravenous, PRN  potassium chloride (KLOR-CON M) extended release tablet 40 mEq, 40 mEq,  Oral, PRN **OR** potassium bicarb-citric acid (EFFER-K) effervescent tablet 40 mEq, 40 mEq, Oral, PRN **OR** potassium chloride 10 mEq/100 mL IVPB (Peripheral Line), 10 mEq, Intravenous, PRN  magnesium sulfate 1 g in dextrose 5% 100 mL IVPB, 1 g, Intravenous, PRN  magnesium hydroxide (MILK OF MAGNESIA) 400 MG/5ML suspension 30 mL, 30 mL, Oral, Daily PRN  ondansetron (ZOFRAN) injection 4 mg, 4 mg, Intravenous, Q6H PRN  famotidine (PEPCID) tablet 20 mg, 20 mg, Oral, BID  glucose (GLUTOSE) 40 % oral gel 15 g, 15 g, Oral, PRN  dextrose 50 % IV solution, 12.5 g, Intravenous, PRN  glucagon (rDNA) injection 1 mg, 1 mg, Intramuscular, PRN  dextrose 5 %  solution, 100 mL/hr, Intravenous, PRN  albuterol (PROVENTIL) nebulizer solution 2.5 mg, 2.5 mg, Nebulization, As Directed RT PRN  aspirin chewable tablet 324 mg, 324 mg, Oral, Once  aspirin EC tablet 325 mg, 325 mg, Oral, Daily  metoprolol tartrate (LOPRESSOR) tablet 25 mg, 25 mg, Oral, BID  nitroGLYCERIN (NITROSTAT) SL tablet 0.4 mg, 0.4 mg, Sublingual, Q5 Min PRN  acetaminophen (TYLENOL) tablet 650 mg, 650 mg, Oral, Q4H PRN  insulin lispro (HUMALOG) injection vial 0-12 Units, 0-12 Units, Subcutaneous, Q4H  heparin (porcine) injection 4,000 Units, 4,000 Units, Intravenous, PRN  heparin (porcine) injection 2,000 Units, 2,000 Units, Intravenous, PRN  heparin 25,000 units in dextrose 5% 250 mL infusion, 1,000 Units/hr, Intravenous, Continuous  morphine injection 2 mg, 2 mg, Intravenous, Q2H PRN **OR** morphine (PF) injection 4 mg, 4 mg, Intravenous, Q2H PRN  LORazepam (ATIVAN) tablet 1 mg, 1 mg, Oral, Q8H PRN    Allergies:  Patient has no known allergies.    Social History:   reports that she has been smoking. She has been smoking about 0.25 packs per day. She has never used smokeless tobacco. She reports that she does not drink alcohol or use drugs.     Family History: family history includes High Blood Pressure in her father and mother.   REVIEW OF SYSTEMS:       ?? Constitutional: there has been no unanticipated weight loss. There's been No change in energy level, No change in activity level.     ?? Eyes: No visual changes or diplopia. No scleral icterus.  ?? ENT: No Headaches  ?? Cardiovascular:  Remaining as above  ?? Respiratory: No previous pulmonary problems, No cough  ?? Gastrointestinal: No abdominal pain.  No change in bowel or bladder habits.  ?? Genitourinary: No dysuria, trouble voiding, or hematuria.  ?? Musculoskeletal: No weakness or joint complaints.  ?? Integumentary: No rash or pruritis.  ?? Neurological: No headache, diplopia, change in muscle strength, numbness or tingling.   ?? Hematologic/Lymphatic: No abnormal bruising or bleeding      PHYSICAL EXAM:      BP (!) 158/98    Pulse 64    Temp 97.7 ??F (36.5 ??C) (Oral)    Resp 13    Ht 5\' 9"  (1.753 m)    Wt 200 lb 12.8 oz (91.1 kg)    SpO2 95%    BMI 29.65 kg/m??      Intake/Output Summary (Last 24 hours) at 11/16/2017 0948  Last data filed at 11/16/2017 [id number]  Gross per 24 hour   Intake 3204.67 ml   Output --   Net 3204.67 ml         Constitutional and General Appearance: alert, cooperative, no distress and appears stated age  HEENT: PERRL, no cervical lymphadenopathy. No masses palpable. Normal oral mucosa  Respiratory:  ?? Normal excursion and expansion without use of accessory muscles  ?? Resp Auscultation: Good respiratory effort. No for increased work of breathing. On auscultation: clear to auscultation bilaterally  Cardiovascular:  ?? The apical impulse is not displaced  ?? Heart tones are crisp and normal. regular S1 and S2.  ?? Jugular venous pulsation Normal  ?? The carotid upstroke is normal in amplitude and contour without delay or bruit  ?? Peripheral pulses are symmetrical and full   ?? Reproducible chest pain  Abdomen:   ?? No masses or tenderness  ?? Bowel sounds present  Extremities:  ??  No Cyanosis or Clubbing  ??  Lower extremity edema: No  ??  Skin: Warm and dry  Neurological:  ?? Alert and oriented.  ?? Moves all  extremities well    DATA:    Diagnostics:    EKG: Normal Sinus rhythm. Prolonged QT   Stress Test: reviewed. Negative       Labs:     CBC:   Recent Labs     11/14/17  2048 11/15/17  0240   WBC 9.2 7.1   HGB 13.0 12.0   HCT 41.5 38.4   PLT 319 300     BMP:   Recent Labs     11/14/17  1424 11/15/17  0240 11/15/17  2307   NA 142 136  --    K 5.3 3.8 4.4   CO2 27 27  --    BUN 11 10  --    CREATININE 0.48* 0.41*  --    LABGLOM >60 >60  --    GLUCOSE 86 146*  --      Pro-BNP:    Recent Labs     11/15/17  0240   PROBNP <20     BNP: No results for input(s): BNP in the last 72 hours.  PT/INR: No results for input(s): PROTIME, INR in the last 72 hours.  APTT:  Recent Labs     11/15/17  2307 11/16/17  0550   APTT 36.3* 47.0*     CARDIAC ENZYMES:No results for input(s): CKTOTAL, CKMB, CKMBINDEX, TROPONINI in the last 72 hours.    Invalid input(s):  TROPONINT  Recent Labs     11/14/17  2048 11/15/17  0240 11/15/17  2307   TROPONINT NOT REPORTED NOT REPORTED NOT REPORTED       FASTING LIPID PANEL:  Lab Results   Component Value Date    HDL 50 11/15/2017    TRIG 65 11/15/2017     LIVER PROFILE:  Recent Labs     11/15/17  0240   AST 13   ALT 18   LABALBU 3.6         Patient's Active Problem List  Principal Problem:    Atypical chest pain  Active Problems:    Hypotension due to drugs    Uncomplicated asthma    Essential hypertension    Parkinson's disease (HCC)    Type 2 diabetes mellitus, without long-term current use of insulin (HCC)    Gastroesophageal reflux disease without esophagitis  Resolved Problems:    * No resolved hospital problems. *        IMPRESSION:    1. WCT could be  Non sustained v tach or atrial flutter with aberancy   2. Chest pain with negative stress test likely musculoskeletal   3. Prolonged QT  4. Essential hypertension  5. DM type 2     RECOMMENDATIONS:    1. Recommend avoid QT prolonging medications  2. Keep K>4 and Mag >2  3. Continue BB  4. Holter monitor       Thank you for allowing [place] to participate  in the care of Renae Gloss. If you have any questions or concerns, please do not hesitate to contact us.    Discussed with patient and Nurse.    Johann Capers, M.D.  Fellow, Cardiovascular Diseases ??  [facility] ??      Please note that part of this chart were generated using voice recognition  dictation software.  Although every effort was made to ensure the accuracy of this automated transcription, some errors in transcription may have  occurred.        Attestation signed by      Attending Physician Statement:    I have discussed the care of  Renae Gloss , including pertinent history and exam findings, with the Cardiology fellow/resident.     I have seen and examined the patient and the key elements of all parts of the encounter have been performed by me. I agree with the assessment, plan and orders as documented by the fellow/resident, after I modified exam findings and plan of treatments, and the final version is my approved version of the assessment.     Additional Comments: atypical chest pain, no ischemic ECG changes. Negative stress test with normal LV systolic function. WCT is probably PAT/AFL with aberration.  Continue BB. Will give her a holter monitor and she needs adjustment of her medication due to QT prolongation.    Armond Hang, MD

## 2017-11-16 NOTE — Plan of Care (Signed)
[facility]   IN-PATIENT SERVICE    [facility]    Second Visit Note  For more detailed information please refer to the progress note of the day      11/16/2017    7:43 PM    Name:   Shannon Allison  MRN:     [id number]     Acct:      [id number]   Room:   00110011002004/2004-01  IP Day:  2  Admit Date:  11/14/2017  2:11 PM    PCP:   Anise Salvoeborah S Tyler, APRN - CNP  Code Status:  Full Code        Pt vitals were reviewed   New labs were reviewed   Patient was seen  Chest pain relieved with Toradol  She is happy her stress test was negative      Updated plan :     1. D/c IVF  2. D/c heparin  3. PRN Toradol added  4. D/c in am if stable and no intervention planned with cardiology        [facility] Hospital[facility]RISTOPHER Red Mandt, DO  11/16/2017  7:43 PM

## 2017-11-17 LAB — BASIC METABOLIC PANEL
Anion Gap: 9 mmol/L (ref 9–17)
BUN: 11 mg/dL (ref 6–20)
CO2: 26 mmol/L (ref 20–31)
Calcium: 8.5 mg/dL — ABNORMAL LOW (ref 8.6–10.4)
Chloride: 104 mmol/L (ref 98–107)
Creatinine: 0.49 mg/dL — ABNORMAL LOW (ref 0.50–0.90)
GFR African American: >60 mL/min (ref >60)
GFR Non-African American: >60 mL/min (ref >60)
Glucose: 91 mg/dL (ref 70–99)
Potassium: 4.2 mmol/L (ref 3.7–5.3)
Sodium: 139 mmol/L (ref 135–144)

## 2017-11-17 LAB — CBC
Hematocrit: 36.4 % (ref 36.3–47.1)
Hemoglobin: 11.4 g/dL — ABNORMAL LOW (ref 11.9–15.1)
MCH: 28.5 pg (ref 25.2–33.5)
MCHC: 31.3 g/dL (ref 28.4–34.8)
MCV: 91 fL (ref 82.6–102.9)
MPV: 10.4 fL (ref 8.1–13.5)
NRBC Automated: 0 per 100 WBC
Platelets: 274 10*3/uL (ref 138–453)
RBC: 4 m/uL (ref 3.95–5.11)
RDW: 13.1 % (ref 11.8–14.4)
WBC: 6 10*3/uL (ref 3.5–11.3)

## 2017-11-17 LAB — EKG 12-LEAD
Atrial Rate: 68 {beats}/min
P Axis: 55 degrees
P-R Interval: 142 ms
Q-T Interval: 460 ms
QRS Duration: 96 ms
QTc Calculation (Bazett): 489 ms
R Axis: -12 degrees
T Axis: 32 degrees
Ventricular Rate: 68 {beats}/min

## 2017-11-17 LAB — POC GLUCOSE FINGERSTICK
POC Glucose: 104 mg/dL (ref 65–105)
POC Glucose: 83 mg/dL (ref 65–105)
POC Glucose: 108 mg/dL — ABNORMAL HIGH (ref 65–105)
POC Glucose: 215 mg/dL — ABNORMAL HIGH (ref 65–105)
POC Glucose: 108 mg/dL — ABNORMAL HIGH (ref 65–105)
POC Glucose: 89 mg/dL (ref 65–105)

## 2017-11-17 MED ORDER — CARVEDILOL 3.125 MG PO TABS
3.125 MG | ORAL_TABLET | Freq: Two times a day (BID) | ORAL | 0 refills | Status: AC
Start: 2017-11-17 — End: ?

## 2017-11-17 MED ORDER — TIOTROPIUM BROMIDE MONOHYDRATE 18 MCG IN CAPS
18 MCG | ORAL_CAPSULE | Freq: Every day | RESPIRATORY_TRACT | 0 refills | Status: AC
Start: 2017-11-17 — End: ?

## 2017-11-17 MED ORDER — FLUTICASONE PROPIONATE 50 MCG/ACT NA SUSP
50 MCG/ACT | Freq: Every day | NASAL | 3 refills | Status: AC
Start: 2017-11-17 — End: ?

## 2017-11-17 MED ORDER — ESOMEPRAZOLE MAGNESIUM 40 MG PO PACK
40 MG | PACK | Freq: Every day | ORAL | 3 refills | Status: AC
Start: 2017-11-17 — End: 2018-04-18

## 2017-11-17 MED ORDER — HYDROXYZINE PAMOATE 25 MG PO CAPS
25 MG | ORAL_CAPSULE | Freq: Three times a day (TID) | ORAL | 0 refills | Status: AC | PRN
Start: 2017-11-17 — End: ?

## 2017-11-17 MED ORDER — MONTELUKAST SODIUM 10 MG PO TABS
10 MG | ORAL_TABLET | Freq: Every evening | ORAL | 0 refills | Status: AC
Start: 2017-11-17 — End: ?

## 2017-11-17 MED ORDER — CELECOXIB 100 MG PO CAPS
100 MG | ORAL_CAPSULE | Freq: Two times a day (BID) | ORAL | 0 refills | Status: AC
Start: 2017-11-17 — End: ?

## 2017-11-17 MED ORDER — CARBIDOPA-LEVODOPA 25-100 MG PO TABS
25-100 MG | ORAL_TABLET | Freq: Three times a day (TID) | ORAL | 0 refills | Status: DC
Start: 2017-11-17 — End: 2018-02-25

## 2017-11-17 MED ORDER — QUETIAPINE FUMARATE 25 MG PO TABS
25 MG | Freq: Every evening | ORAL | Status: DC
Start: 2017-11-17 — End: 2017-11-17

## 2017-11-17 MED ORDER — LISINOPRIL 10 MG PO TABS
10 MG | ORAL_TABLET | Freq: Every day | ORAL | 0 refills | Status: AC
Start: 2017-11-17 — End: ?

## 2017-11-17 MED ORDER — FOLIC ACID 1 MG PO TABS
1 MG | ORAL_TABLET | Freq: Every day | ORAL | 0 refills | Status: AC
Start: 2017-11-17 — End: ?

## 2017-11-17 MED ORDER — ASPIRIN 325 MG PO TBEC
325 MG | ORAL_TABLET | Freq: Every day | ORAL | 0 refills | Status: AC
Start: 2017-11-17 — End: ?

## 2017-11-17 MED ORDER — METFORMIN HCL 500 MG PO TABS
500 MG | ORAL_TABLET | Freq: Two times a day (BID) | ORAL | 0 refills | Status: AC
Start: 2017-11-17 — End: ?

## 2017-11-17 MED ORDER — ALBUTEROL SULFATE (2.5 MG/3ML) 0.083% IN NEBU
RESPIRATORY_TRACT | 0 refills | Status: AC | PRN
Start: 2017-11-17 — End: ?

## 2017-11-17 MED ORDER — IPRATROPIUM-ALBUTEROL 0.5-2.5 (3) MG/3ML IN SOLN
RESPIRATORY_TRACT | 0 refills | Status: AC | PRN
Start: 2017-11-17 — End: ?

## 2017-11-17 MED ORDER — LISINOPRIL 10 MG PO TABS
10 MG | ORAL_TABLET | Freq: Every day | ORAL | 0 refills | Status: DC
Start: 2017-11-17 — End: 2017-11-17

## 2017-11-17 MED ORDER — DESVENLAFAXINE SUCCINATE ER 100 MG PO TB24
100 MG | ORAL_TABLET | Freq: Every day | ORAL | 0 refills | Status: AC
Start: 2017-11-17 — End: ?

## 2017-11-17 MED ORDER — MAGNESIUM HYDROXIDE 400 MG/5ML PO SUSP
400 MG/5ML | Freq: Every day | ORAL | Status: AC | PRN
Start: 2017-11-17 — End: ?

## 2017-11-17 MED ORDER — QUETIAPINE FUMARATE 25 MG PO TABS
25 MG | ORAL_TABLET | Freq: Every evening | ORAL | 0 refills | Status: AC | PRN
Start: 2017-11-17 — End: ?

## 2017-11-17 MED ORDER — LORAZEPAM 1 MG PO TABS
1 MG | ORAL_TABLET | Freq: Three times a day (TID) | ORAL | 0 refills | Status: AC | PRN
Start: 2017-11-17 — End: 2017-11-22

## 2017-11-17 MED ORDER — ATORVASTATIN CALCIUM 40 MG PO TABS
40 MG | ORAL_TABLET | Freq: Every evening | ORAL | 0 refills | Status: AC
Start: 2017-11-17 — End: ?

## 2017-11-17 MED ORDER — ROPINIROLE HCL 4 MG PO TABS
4 MG | ORAL_TABLET | Freq: Three times a day (TID) | ORAL | 0 refills | Status: AC
Start: 2017-11-17 — End: ?

## 2017-11-17 MED ORDER — ACETAMINOPHEN 325 MG PO TABS
325 MG | ORAL_TABLET | ORAL | 0 refills | Status: AC | PRN
Start: 2017-11-17 — End: ?

## 2017-11-17 MED ORDER — SENNA-DOCUSATE SODIUM 8.6-50 MG PO TABS
ORAL_TABLET | Freq: Every day | ORAL | 0 refills | Status: AC
Start: 2017-11-17 — End: ?

## 2017-11-17 MED FILL — KETOROLAC TROMETHAMINE 15 MG/ML IJ SOLN: 15 mg/mL | INTRAMUSCULAR | Qty: 1

## 2017-11-17 MED FILL — FAMOTIDINE 20 MG PO TABS: 20 mg | ORAL | Qty: 1

## 2017-11-17 MED FILL — METOPROLOL TARTRATE 25 MG PO TABS: 25 mg | ORAL | Qty: 1

## 2017-11-17 MED FILL — ROPINIROLE HCL 1 MG PO TABS: 1 mg | ORAL | Qty: 4

## 2017-11-17 MED FILL — HYDROXYZINE HCL 25 MG PO TABS: 25 mg | ORAL | Qty: 1

## 2017-11-17 MED FILL — ASPIRIN EC 325 MG PO TBEC: 325 mg | ORAL | Qty: 1

## 2017-11-17 MED FILL — QUETIAPINE FUMARATE 25 MG PO TABS: 25 mg | ORAL | Qty: 1

## 2017-11-17 MED FILL — CARBIDOPA-LEVODOPA 25-100 MG PO TABS: 25-100 mg | ORAL | Qty: 1

## 2017-11-17 MED FILL — SENNA PLUS 8.6-50 MG PO TABS: 8.6-50 mg | ORAL | Qty: 1

## 2017-11-17 MED FILL — MONTELUKAST SODIUM 10 MG PO TABS: 10 mg | ORAL | Qty: 1

## 2017-11-17 MED FILL — PRISTIQ 50 MG PO TB24: 50 mg | ORAL | Qty: 2

## 2017-11-17 MED FILL — HEALTHYLAX 17 G PO PACK: 17 g | ORAL | Qty: 1

## 2017-11-17 MED FILL — NICOTINE STEP 2 14 MG/24HR TD PT24: 14 mg/(24.h) | TRANSDERMAL | Qty: 1

## 2017-11-17 MED FILL — SPIRIVA HANDIHALER 18 MCG IN CAPS: 18 ug | RESPIRATORY_TRACT | Qty: 5

## 2017-11-17 MED FILL — SEROQUEL 100 MG PO TABS: 100 mg | ORAL | Qty: 1

## 2017-11-17 MED FILL — LISINOPRIL 5 MG PO TABS: 5 mg | ORAL | Qty: 1

## 2017-11-17 MED FILL — FOLIC ACID 1 MG PO TABS: 1 mg | ORAL | Qty: 1

## 2017-11-17 MED FILL — ATORVASTATIN CALCIUM 40 MG PO TABS: 40 mg | ORAL | Qty: 1

## 2017-11-17 NOTE — Discharge Instructions (Signed)
???   Good nutrition is important when healing from an illness, injury, or surgery.  Follow any nutrition recommendations given to you during your hospital stay.   ??? If you were given an oral nutrition supplement while in the hospital, continue to take this supplement at home.  You can take it with meals, in-between meals, and/or before bedtime. These supplements can be purchased at most local grocery stores, pharmacies, and chain super-stores.   ??? If you have any questions about your diet or nutrition, call the hospital and ask for the dietitian.    Cardiac, diabetic diet

## 2017-11-17 NOTE — Discharge Summary (Signed)
Physician Discharge Summary     Patient ID:  Shannon Allison  [id number]  57 y.o.  09/04/1960    Admit date: 11/14/2017    Discharge date and time: 11/17/2017  5:57 PM     Admitting Physician: Archie Endo, DO     Discharge Physician: Stephanie Coup Artesia Berkey, DO    Admission Diagnoses: Chest pain [R07.9]  Chest pain [R07.9]    Discharge Diagnoses: Noncardiac chest pain, likely musculoskeletal in nature    Admission Condition: good    Discharged Condition: good    Indication for Admission: Atypical chest pain    Hospital Course:   The patient is a??57 y.o.??female with a PMH notable for recently diagnosed Parkinson's disease, depression, insomnia, asthma, type 2 diabetes mellitus, and hypertension who presented to??[facility]??on 8/7 for chest pain.??Patient notes that her chest pain is been long-standing and ongoing. However, yesterday her chest pain acutely became worse.??It is both right and left-sided chest pain. ??It is reproducible to palpation. ??She denies any nausea, vomiting, lightheadedness, cough, shortness of breath, diaphoresis, or myalgias associated with this chest pain.??She reported that she had an episode worsening of her chest pain with subsequent presentation to the ED for workup. Cardiac work-up has been initiated and reveals small abnormalities on her nuclear imaging.??EKG has been reviewed and is without any acute ischemic changes. Pt with nonstustained V-tach as well overnight on 8/9. Cardiology with no plans for intervention at this time. She is to continue a low-dose beta-blocker secondary to wide-complex tachycardia.  She did have an episode of bradycardia on her second day of hospitalization with metoprolol 25 mg.  This will be changed to Coreg 3.125 at this time.  Furthermore, she initially presented with a prolonged QTC.  Her QTC at discharge is 454.  We have adjusted her Seroquel to 50 mg nightly as needed from 100 mg nightly as needed.  Patient understands and agrees to the above treatment plan.   Please see daily progress notes for further details.    Consults: cardiology    Significant Diagnostic Studies: nuclear medicine: Lexiscan - negative; Stress testing:      There is normal distribution of radiotracer throughout the myocardium without   evidence for a significant reversible or fixed perfusion defect.         Treatments: IV hydration and anticoagulation: heparin    Discharge Exam:  General appearance:  alert, cooperative and no distress  Mental Status:  A&Ox3 and normal affect  Lungs:  clear to auscultation bilaterally, normal effort  Chest Wall: palpation of anterior chest wall reproduces the patient's chest pain  Heart:  regular rate and rhythm, no murmurs, rubs, or gallops  Abdomen:  soft, nontender, protuberant abdomen, nondistended, normal bowel sounds, no masses, hepatomegaly, splenomegaly  Extremities:  no edema, redness, tenderness in the calves  Neurologic: resting tremors again noted in left arm today; cognitive impairment noted  Skin:  no gross lesions, rashes, induration  Psych: labile mood, progresses from laughter to tears    Disposition: home    In process/preliminary results:  Outstanding Order Results     Date and Time Order Name Status Description    11/17/2017 0919 EKG 12 Lead Preliminary           Patient Instructions:   Current Discharge Medication List      START taking these medications    Details   aspirin 325 MG EC tablet Take 1 tablet by mouth daily  Qty: 30 tablet, Refills: 0  celecoxib (CELEBREX) 100 MG capsule Take 1 capsule by mouth 2 times daily  Qty: 60 capsule, Refills: 0      carvedilol (COREG) 3.125 MG tablet Take 1 tablet by mouth 2 times daily  Qty: 60 tablet, Refills: 0         CONTINUE these medications which have CHANGED    Details   atorvastatin (LIPITOR) 40 MG tablet Take 1 tablet by mouth nightly  Qty: 30 tablet, Refills: 0      QUEtiapine (SEROQUEL) 25 MG tablet Take 2 tablets by mouth nightly as needed for Other (Sleep)  Qty: 60 tablet, Refills: 0       lisinopril (PRINIVIL;ZESTRIL) 10 MG tablet Take 0.5 tablets by mouth daily Patient to continue to take her Lisinopril.  Qty: 30 tablet, Refills: 0         CONTINUE these medications which have NOT CHANGED    Details   LORazepam (ATIVAN) 1 MG tablet Take 1 mg by mouth every 8 hours as needed for Anxiety.      rOPINIRole (REQUIP) 4 MG tablet Take 1 tablet by mouth 3 times daily  Qty: 90 tablet, Refills: 5      carbidopa-levodopa (SINEMET) 25-100 MG per tablet Take 1 tablet by mouth 3 times daily  Qty: 90 tablet, Refills: 5      acetaminophen (TYLENOL) 325 MG tablet Take 2 tablets by mouth every 4 hours as needed for Pain or Fever  Qty: 30 tablet, Refills: 0      hydrOXYzine (VISTARIL) 25 MG capsule Take 1 capsule by mouth 3 times daily as needed for Itching  Qty: 90 capsule, Refills: 2      albuterol (PROVENTIL) (2.5 MG/3ML) 0.083% nebulizer solution Take 3 mLs by nebulization every 4 hours as needed for Wheezing or Shortness of Breath  Qty: 120 mL, Refills: 3      ipratropium-albuterol (DUONEB) 0.5-2.5 (3) MG/3ML SOLN nebulizer solution Inhale 3 mLs into the lungs every 4 hours as needed for Shortness of Breath  Qty: 360 mL, Refills: 3      montelukast (SINGULAIR) 10 MG tablet Take 1 tablet by mouth nightly  Qty: 30 tablet, Refills: 3      tiotropium (SPIRIVA HANDIHALER) 18 MCG inhalation capsule Inhale 1 capsule into the lungs daily  Qty: 30 capsule, Refills: 3      desvenlafaxine succinate (PRISTIQ) 100 MG TB24 extended release tablet Take 1 tablet by mouth daily  Qty: 30 tablet, Refills: 3      metFORMIN (GLUCOPHAGE) 500 MG tablet Take 1 tablet by mouth 2 times daily (with meals)  Qty: 60 tablet, Refills: 3      fluticasone (FLONASE) 50 MCG/ACT nasal spray 1 spray by Nasal route daily  Qty: 1 Bottle, Refills: 3      folic acid (FOLVITE) 1 MG tablet Take 1 tablet by mouth daily  Qty: 30 tablet, Refills: 3      magnesium hydroxide (MILK OF MAGNESIA) 400 MG/5ML suspension Take 30 mLs by mouth daily as needed for  Constipation      sennosides-docusate sodium (SENOKOT-S) 8.6-50 MG tablet Take 1 tablet by mouth daily  Qty: 30 tablet, Refills: 2      esomeprazole Magnesium (NEXIUM) 40 MG PACK Take 1 packet by mouth daily  Qty: 30 packet, Refills: 3      paliperidone palmitate (INVEGA SUSTENNA) 234 MG/1.5ML SUSP IM injection Inject 234 mg into the muscle every 30 days           Activity: activity  as tolerated  Diet: cardiac diet and diabetic diet  Wound Care: none needed    Follow-up with your PCP in 1 week. Schedule an appointment as soon as possible with Dr. Marcello Fennel with cardiology regarding wide complex tachycardia and episodes of bradycardia with metoprolol.    SignedGolden Circle, DO  11/18/2017  5:44 PM

## 2017-11-17 NOTE — Other (Signed)
Patient returned holter monitor. Data downloaded and sent to be scanned and read by cardiologist.

## 2017-11-17 NOTE — Plan of Care (Signed)
High fall protocols in place  Bed in lowest position  Call light within reach  Continue to monitor

## 2017-11-17 NOTE — Care Coordination-Inpatient (Addendum)
Discharge Report    [facility]  Clinical [facility] Management Department  Written by: Reatha ArmourEBORAH D Udell Blasingame, RN    Patient Name: Renae GlossElizabeth J Helt  Attending Provider: Stephanie Couphristopher M Hauger, DO  Admit Date: 11/14/2017  2:11 PM  MRN: [id number]  Account: [id number]                     DOB: July 10, 1960  Discharge Date: 11/17/17      [facility]: [facility], called her mother and sherrie watson to notify of patients return home today, transport by cab confirmation # [id number]     Reatha ArmourEBORAH D Thoren Hosang, RN

## 2017-11-17 NOTE — Procedures (Signed)
[facility]                  [address] [place], [place] [id number]                                 HOLTER MONITOR    PATIENT NAME: Renae GlossKOHL, Meli J                   DOB:        Oct 13, 1960  MED REC NO:   [id number]                             ROOM:       2004  ACCOUNT NO:   [id number]                           ADMIT DATE: 11/14/2017  PROVIDER:     Laretta Alstromarif Lavonda Thal    HOLTER MONITOR 48-HOURS    DATE OF STUDY:  11/17/2017    INDICATION:  Irregular heart rate    AUTHORIZING PROVIDER:  Armond HangMohammed Alkhateeb, MD  PRIMARY CARE PROVIDER:  Ronnald Collumeborah Tyler, CNP  INTERPRETING PHYSICIAN:  Marisue Brooklyn. Jakayden Cancio, MD    Comments:  The patient appeared to remain in sinus rhythm throughout recording with  sinus bradycardia and sinus tachycardia noted.  Rare premature atrial  contractions were noted.  Rare premature ventricular contractions were  noted.  Patient events were noted.    1.  Sinus rhythm with sinus bradycardia and sinus tachycardia  2.  Rare premature atrial contractions  3.  Rare premature ventricular contractions          Malin Cervini    D: 11/22/2017 13:02:29       T: 11/22/2017 13:23:05     TK/BMOORE)

## 2017-11-17 NOTE — Progress Notes (Signed)
[facility]   IN-PATIENT SERVICE   [facility]    Progress Note    11/17/2017    12:13 PM    Name:   Shannon Allison  MRN:     [id number]     Acct:      [id number]   Room:   [id number]  IP Day:  3  Admit Date:  11/14/2017  2:11 PM    PCP:   Anise Salvo, APRN - CNP  Code Status:  Full Code    Subjective:     C/C:   Chief Complaint   Patient presents with   ??? Gastroesophageal Reflux   ??? Chest Pain     started around 9am      Interval History Status: not changed.     Pt seen and examined this morning on rounds.  She continues to be pleasant upon my arrival to the room.  She continues to complain of intermittent left-sided chest pain which is reproducible to palpation and improved with Toradol.  Cardiology is discussed stress test findings with the patient.  She is eager for discharge today.  She is uncertain as whether or not she would like to wear a Holter monitor at discharge.  She denies nausea, vomiting, chest pain, shortness of breath at this time.  She is requesting to take a shower.    Brief History:     The patient is a 57 y.o. female with a PMH notable for recently diagnosed Parkinson's disease, depression, insomnia, asthma, type 2 diabetes mellitus, and hypertension who presented to [facility] on 8/7 for chest pain.  Patient notes that her chest pain is been long-standing and ongoing.  However, yesterday her chest pain acutely became worse.  It is both right and left-sided chest pain.  It is reproducible to palpation.  She denies any nausea, vomiting, lightheadedness, cough, shortness of breath, diaphoresis, or myalgias associated with this chest pain.  She reported that she had an episode worsening of her chest pain with subsequent presentation to the ED for workup.  Cardiac work-up has been initiated and reveals small abnormalities on her nuclear imaging.  EKG has been reviewed and is without any acute ischemic changes. Pt with nonstustained V-tach as well overnight on  8/9.  Cardiology with no plans for intervention at this time.  She is to continue a low-dose beta-blocker secondary to wide-complex tachycardia.  She did have an episode of bradycardia yesterday afternoon with metoprolol 25 mg.  This will be changed to Coreg 3.125 at this time.  Furthermore, she initially presented with a prolonged QTC.  Her QTC at discharge is 454.  We have adjusted her Seroquel to 50 mg nightly as needed from 100 mg nightly as needed.  Patient understands and agrees to the above treatment plan.  Please see daily progress notes for further details.    Review of Systems:     Constitutional:  negative for chills, fevers, sweats  Respiratory:  negative for cough, dyspnea on exertion, hemoptysis, shortness of breath, wheezing  Cardiovascular:  Positive for intermittent chest pain; denies lower extremity edema, palpitations  Gastrointestinal: negative for abdominal pain, diarrhea, nausea, vomiting  Neurological:  negative for dizziness, headache    Medications:     Allergies:  No Known Allergies    Current Meds:   Scheduled Meds:   ??? QUEtiapine  25 mg Oral Nightly   ??? polyethylene glycol  17 g Oral BID   ??? nicotine  1  patch Transdermal Daily   ??? atorvastatin  40 mg Oral Nightly   ??? carbidopa-levodopa  1 tablet Oral TID   ??? desvenlafaxine succinate  100 mg Oral Daily   ??? fluticasone  1 spray Nasal Daily   ??? folic acid  1 mg Oral Daily   ??? lisinopril  5 mg Oral Daily   ??? montelukast  10 mg Oral Nightly   ??? rOPINIRole  4 mg Oral TID   ??? sennosides-docusate sodium  1 tablet Oral Daily   ??? tiotropium  18 mcg Inhalation Daily   ??? sodium chloride flush  10 mL Intravenous 2 times per day   ??? sodium chloride flush  10 mL Intravenous 2 times per day   ??? famotidine  20 mg Oral BID   ??? aspirin  324 mg Oral Once   ??? aspirin  325 mg Oral Daily   ??? insulin lispro  0-12 Units Subcutaneous Q4H     Continuous Infusions:   ??? dextrose       PRN Meds: ketorolac, sodium chloride flush, sodium chloride flush, albuterol,  hydrOXYzine, ipratropium-albuterol, sodium chloride flush, potassium chloride **OR** potassium alternative oral replacement **OR** potassium chloride, magnesium sulfate, magnesium hydroxide, ondansetron, glucose, dextrose, glucagon (rDNA), dextrose, albuterol, nitroGLYCERIN, acetaminophen, morphine **OR** morphine, LORazepam    Data:     Past Medical History:   has a past medical history of Asthma, COPD (chronic obstructive pulmonary disease) (HCC), Diabetes mellitus (HCC), Hyperlipidemia, Hypertension, Parkinson disease (HCC), and Pneumonia.    Social History:   reports that she has been smoking. She has been smoking about 0.25 packs per day. She has never used smokeless tobacco. She reports that she does not drink alcohol or use drugs.     Family History:   Family History   Problem Relation Age of Onset   ??? High Blood Pressure Mother    ??? High Blood Pressure Father        Vitals:  BP 103/60    Pulse 66    Temp 98.2 ??F (36.8 ??C) (Oral)    Resp 15    Ht 5\' 9"  (1.753 m)    Wt 203 lb 3.2 oz (92.2 kg)    SpO2 93%    BMI 30.01 kg/m??   Temp (24hrs), Avg:97.7 ??F (36.5 ??C), Min:97.3 ??F (36.3 ??C), Max:98.2 ??F (36.8 ??C)    Recent Labs     11/17/17  0016 11/17/17  0414 11/17/17  0835 11/17/17  1152   POCGLU 83 108* 215* 108*       I/O (24Hr):    Intake/Output Summary (Last 24 hours) at 11/17/2017 1213  Last data filed at 11/17/2017 0836  Gross per 24 hour   Intake 2311 ml   Output --   Net 2311 ml       Labs:  Hematology:  Recent Labs     11/14/17  2048 11/15/17  0240 11/17/17  0456   WBC 9.2 7.1 6.0   RBC 4.56 4.20 4.00   HGB 13.0 12.0 11.4*   HCT 41.5 38.4 36.4   MCV 91.0 91.4 91.0   MCH 28.5 28.6 28.5   MCHC 31.3 31.3 31.3   RDW 12.9 13.1 13.1   PLT 319 300 274   MPV 10.5 10.1 10.4     Chemistry:  Recent Labs     11/14/17  1424  11/14/17  2048 11/15/17  0240 11/15/17  2307 11/17/17  0456   NA 142  --   --  136  --  139   K 5.3  --   --  3.8 4.4 4.2   CL 103  --   --  99  --  104   CO2 27  --   --  27  --  26   GLUCOSE 86  --    --  146*  --  91   BUN 11  --   --  10  --  11   CREATININE 0.48*  --   --  0.41*  --  0.49*   MG  --   --   --  2.0 2.1  --    ANIONGAP 12  --   --  10  --  9   LABGLOM >60  --   --  >60  --  >60   GFRAA >60  --   --  >60  --  >60   CALCIUM 9.3  --   --  8.7  --  8.5*   PROBNP  --   --   --  <20  --   --    TROPHS <6   < > <6 <6 <6  --     < > = values in this interval not displayed.     Recent Labs     11/14/17  2048  11/15/17  0240  11/16/17  1708 11/16/17  2028 11/17/17  0016 11/17/17  0414 11/17/17  0835 11/17/17  1152   PROT  --   --  6.6  --   --   --   --   --   --   --    LABALBU  --   --  3.6  --   --   --   --   --   --   --    LABA1C 6.3*  --   --   --   --   --   --   --   --   --    AST  --   --  13  --   --   --   --   --   --   --    ALT  --   --  18  --   --   --   --   --   --   --    ALKPHOS  --   --  82  --   --   --   --   --   --   --    BILITOT  --   --  0.21*  --   --   --   --   --   --   --    CHOL  --   --  124  --   --   --   --   --   --   --    HDL  --   --  50  --   --   --   --   --   --   --    LDLCHOLESTEROL  --   --  61  --   --   --   --   --   --   --    CHOLHDLRATIO  --   --  2.5  --   --   --   --   --   --   --    TRIG  --   --  65  --   --   --   --   --   --   --  VLDL  --   --  NOT REPORTED  --   --   --   --   --   --   --    POCGLU  --    < >  --    < > 188* 104 83 108* 215* 108*    < > = values in this interval not displayed.     ABG:  Lab Results   Component Value Date    FIO2 NOT REPORTED 12/26/2016     Lab Results   Component Value Date/Time    SPECIAL NOT REPORTED 09/20/2016 05:57 PM     Lab Results   Component Value Date/Time    CULTURE NO SIGNIFICANT GROWTH 09/20/2016 05:57 PM    CULTURE  09/20/2016 05:57 PM     [facility] [address], [place] [address] [phone]       Radiology:  Xr Chest Standard (2 Vw)    Result Date: 11/14/2017  No acute cardiopulmonary pathology.     Nm Cardiac Stress Test Nuclear Imaging    Result Date: 11/15/2017  Normal study.  Risk stratification: Low       Physical Examination:        General appearance:  alert, cooperative and no distress  Mental Status:  A&Ox3 and normal affect  Lungs:  clear to auscultation bilaterally, normal effort  Chest Wall: palpation of anterior chest wall reproduces the patient's chest pain  Heart:  regular rate and rhythm, no murmurs, rubs, or gallops  Abdomen:  soft, nontender, protuberant abdomen, nondistended, normal bowel sounds, no masses, hepatomegaly, splenomegaly  Extremities:  no edema, redness, tenderness in the calves  Neurologic: resting tremors again noted in left arm today; cognitive impairment noted  Skin:  no gross lesions, rashes, induration  Psych: labile mood, progresses from laughter to tears    Assessment:        Hospital Problems           Last Modified POA    * (Principal) Chest pain, musculoskeletal 11/17/2017 Yes    Essential hypertension 11/17/2017 Yes    Atypical chest pain 11/17/2017 Yes    Prolonged QT interval 11/17/2017 Yes    Wide-complex tachycardia (HCC) 11/17/2017 No    Type 2 diabetes mellitus, without long-term current use of insulin (HCC) 11/17/2017 Yes    Gastroesophageal reflux disease without esophagitis 11/17/2017 Yes    Uncomplicated asthma 11/17/2017 Yes    Behavior related to cognitive impairment 11/17/2017 Yes    Parkinson's disease (HCC) 11/17/2017 Yes          Plan:        1. Appreciate cardiology's input -patient stable for discharge home with Holter monitor, continue beta-blocker, increase Lipitor, continue aspirin  2. Continue NSAID therapy with Celebrex as I believe this is largely musculoskeletal in nature.  3. Close attention to BP - she will need to follow-up with her PCP for repeat vital signs in 1 week.  4. Continue Seroquel at half dose secondary to prolonged QTC.  5. Change metoprolol to Coreg secondary to bradycardia    Asim Gersten, DO  11/17/2017  12:13 PM

## 2017-11-17 NOTE — Progress Notes (Signed)
CLINICAL PHARMACY NOTE: MEDS TO BEDS    [facility] Patient?: No  Total # of Prescriptions Filled: 16   The following medications were delivered to the patient:  ?? Ipratropium-albuterol  ?? desvenlafaxine  ?? spiriva  ?? Acetaminophen  ?? Fluticasone  ?? Albuterol-sulfate  ?? Carbidopa  ?? Montelukast  ?? Metformin  ?? Ropinirole  ?? Hydroxyzine  ?? Sennosides-docusate  ?? Lorazepam  ?? Folic acid  ?? esomeprazole  Total # of Interventions Completed: 0  Time Spent (min): 0    Additional Documentation:

## 2017-11-17 NOTE — Other (Signed)
Assessment: Patient was reclining in her chair when chaplain visited. Family was not present at the time. When asked how she was feeling, patient responded; "better but I am experiencing a little pain. Patient was raised Catholic but not affiliated with any parish.     Intervention: Chaplain maintained presence, offered support and prayed with patient. Patient received sacrament of anointing of the sick.     Outcome: Patient was very appreciative of the anointing she received.     Follow up visits recommended for more prayers and support.       11/17/17 1103   Encounter Summary   Services provided to: Patient   Support System Family members   Continue Visiting   (11/17/2017)   Complexity of Encounter Moderate   Length of Encounter 15 minutes   Spiritual Assessment Completed Yes   Routine   Type Follow up   Spiritual/Religious   Type Spiritual support   Assessment Calm;Approachable;Hopeful   Intervention Active listening;Nurtured hope;Prayer;Anointing   Outcome Expressed gratitude   Administrator, [facility]   Sacrament of Sick-Anointing Anointed

## 2017-11-17 NOTE — Other (Signed)
Holter Monitor was applied as ordered. Monitor is to be worn  for 48 hrs.  Written and verbal instructions were given.  Monitor #[id number].  Patient is going to a group home and was given a mailer to return it via [employer].

## 2017-11-17 NOTE — Plan of Care (Signed)
Will assess patient using 0-10 pain scale  Administer pain medication as ordered  Reassess patients pain score   Will continue to monitor patient

## 2017-11-17 NOTE — Discharge Instructions (Signed)
Home activity as tolerated

## 2017-11-17 NOTE — Plan of Care (Signed)
BRONCHOSPASM/BRONCHOCONSTRICTION     ?         IMPROVE AERATION/BREATH SOUNDS  ?   ADMINISTER BRONCHODILATOR THERAPY AS APPROPRIATE  ?   ASSESS BREATH SOUNDS  ?   IMPLEMENT AEROSOL/MDI PROTOCOL  ?   PATIENT EDUCATION AS NEEDED

## 2017-11-19 LAB — EKG 12-LEAD
Atrial Rate: 65 {beats}/min
Atrial Rate: 72 {beats}/min
P Axis: 35 degrees
P Axis: 61 degrees
P-R Interval: 144 ms
P-R Interval: 158 ms
Q-T Interval: 440 ms
Q-T Interval: 460 ms
QRS Duration: 104 ms
QRS Duration: 96 ms
QTc Calculation (Bazett): 457 ms
QTc Calculation (Bazett): 503 ms
R Axis: 16 degrees
R Axis: 25 degrees
T Axis: 34 degrees
T Axis: 50 degrees
Ventricular Rate: 65 {beats}/min
Ventricular Rate: 72 {beats}/min

## 2017-12-24 ENCOUNTER — Encounter: Attending: Adult Health | Primary: Nurse Practitioner

## 2018-02-25 ENCOUNTER — Ambulatory Visit: Admit: 2018-02-25 | Discharge: 2018-02-25 | Payer: MEDICAID | Attending: Adult Health | Primary: Nurse Practitioner

## 2018-02-25 DIAGNOSIS — G20 Parkinson's disease: Secondary | ICD-10-CM

## 2018-02-25 MED ORDER — CARBIDOPA-LEVODOPA 25-100 MG PO TABS
25-100 MG | ORAL_TABLET | Freq: Three times a day (TID) | ORAL | 5 refills | Status: DC
Start: 2018-02-25 — End: 2018-04-18

## 2018-02-25 NOTE — Progress Notes (Signed)
[facility]            [address]          [place], [place] [address]          Dept: [phone]          Dept Fax: [phone]        E. Roanna Epley, MD           Jacqlyn Krauss, MD         Ahmed B. Georgetta Haber, MD                   Magda Bernheim, MD              Magda Paganini, MD                Darcella Cheshire, CNP            02/25/2018      HISTORY OF PRESENT ILLNESS:       I had the pleasure of seeing Shannon Allison, who returns for continuing neurologic care.  Patient is a 57 year old woman who was seen last on September 20, 2017 for evaluation of Parkinson's disease.  Patient was initially seen in November 2017 for evaluation of tremor.  She has a past medical history consistent with diabetes mellitus type 2, hypertension, dyslipidemia, schizophrenia, and bipolar disorder.  She lives in a group home currently because of schizophrenia.  The patient primarily has tremor which is worse on the left than the right.  The tremor is at rest.  She has a shuffling gait, mild mask like face, cogwheel rigidity, and bradykinesia.  There is no postural instability.  The patient was titrated on Requip 4 mg 3 times daily and because of her frustration, she was placed on carbidopa levodopa 25/100 mg 3 times daily despite her young age.  She was seen by Dr. Jay Schlichter at the [facility] for a second opinion regarding her Parkinson's disease.  No further recommendations were made.  The patient is here today accompanied by her mother who lives in [place].  The plan is to move the patient from [place] to live with her mother in [place].  Her mother is quite frustrated with the lack of improvement in the patient's condition.  I did advise that many of her medications have to be titrated slowly.  The patient is tolerating her current medications without side effects.  The patient also attended the big and loud program designed for Parkinson's disease patients which helped  to improve her ambulation.                  Prior testing reviewed:      ??  -DAT scan 03/24/16 showing decreased up take in her left putamen compared to the right which is consistent with Parkinson's disease  -MRI brain with and without contrast 03/24/16 normal  ??  ??  ??        PAST MEDICAL HISTORY:         Diagnosis Date   ??? Asthma    ??? COPD (chronic obstructive pulmonary disease) (HCC)    ??? Diabetes mellitus (HCC)    ??? Hyperlipidemia    ??? Hypertension    ??? Parkinson disease (HCC)    ??? Pneumonia 12/2016        PAST SURGICAL HISTORY:         Procedure Laterality Date   ??? NASAL SEPTUM SURGERY Bilateral    ???  PR COLON CA SCRN NOT HI [facility] IND  01/04/2016    COLONOSCOPY performed by Link Snuffer, MD at [facility]   ??? PR COLON CA SCRN NOT HI [facility] IND N/A 02/10/2016    COLONOSCOPY; incomplete; aborted due to poor prep performed by Link Snuffer, MD at [facility]   ??? PR ESOPHAGOGASTRODUODENOSCOPY TRANSORAL DIAGNOSTIC N/A 01/04/2016    EGD ESOPHAGOGASTRODUODENOSCOPY performed by Link Snuffer, MD at [facility]        SOCIAL HISTORY:     Social History     Socioeconomic History   ??? Marital status: Single     Spouse name: Not on file   ??? Number of children: Not on file   ??? Years of education: Not on file   ??? Highest education level: Not on file   Occupational History   ??? Not on file   Social Needs   ??? Financial resource strain: Not on file   ??? Food insecurity:     Worry: Not on file     Inability: Not on file   ??? Transportation needs:     Medical: Not on file     Non-medical: Not on file   Tobacco Use   ??? Smoking status: Current Every Day Smoker     Packs/day: 0.25   ??? Smokeless tobacco: Never Used   Substance and Sexual Activity   ??? Alcohol use: No   ??? Drug use: No   ??? Sexual activity: Not on file   Lifestyle   ??? Physical activity:     Days per week: Not on file     Minutes per session: Not on file   ??? Stress: Not on file   Relationships   ??? Social connections:     Talks on phone: Not on file      Gets together: Not on file     Attends religious service: Not on file     Active member of club or organization: Not on file     Attends meetings of clubs or organizations: Not on file     Relationship status: Not on file   ??? Intimate partner violence:     Fear of current or ex partner: Not on file     Emotionally abused: Not on file     Physically abused: Not on file     Forced sexual activity: Not on file   Other Topics Concern   ??? Not on file   Social History Narrative   ??? Not on file       CURRENT MEDICATIONS:     Current Outpatient Medications   Medication Sig Dispense Refill   ??? atorvastatin (LIPITOR) 40 MG tablet Take 1 tablet by mouth nightly 30 tablet 0   ??? QUEtiapine (SEROQUEL) 25 MG tablet Take 2 tablets by mouth nightly as needed for Other (Sleep) 60 tablet 0   ??? aspirin 325 MG EC tablet Take 1 tablet by mouth daily 30 tablet 0   ??? celecoxib (CELEBREX) 100 MG capsule Take 1 capsule by mouth 2 times daily 60 capsule 0   ??? lisinopril (PRINIVIL;ZESTRIL) 10 MG tablet Take 0.5 tablets by mouth daily Patient to continue to take her Lisinopril. 30 tablet 0   ??? carvedilol (COREG) 3.125 MG tablet Take 1 tablet by mouth 2 times daily 60 tablet 0   ??? acetaminophen (TYLENOL) 325 MG tablet Take 2 tablets by mouth every 4 hours as needed for Pain or Fever 30  tablet 0   ??? albuterol (PROVENTIL) (2.5 MG/3ML) 0.083% nebulizer solution Take 3 mLs by nebulization every 4 hours as needed for Wheezing or Shortness of Breath 120 mL 0   ??? carbidopa-levodopa (SINEMET) 25-100 MG per tablet Take 1 tablet by mouth 3 times daily 90 tablet 0   ??? desvenlafaxine succinate (PRISTIQ) 100 MG TB24 extended release tablet Take 1 tablet by mouth daily 30 tablet 0   ??? esomeprazole Magnesium (NEXIUM) 40 MG PACK Take 1 packet by mouth daily 30 packet 3   ??? fluticasone (FLONASE) 50 MCG/ACT nasal spray 1 spray by Nasal route daily 1 Bottle 3   ??? folic acid (FOLVITE) 1 MG tablet Take 1 tablet by mouth daily 30 tablet 0   ??? hydrOXYzine (VISTARIL)  25 MG capsule Take 1 capsule by mouth 3 times daily as needed for Itching 60 capsule 0   ??? ipratropium-albuterol (DUONEB) 0.5-2.5 (3) MG/3ML SOLN nebulizer solution Inhale 3 mLs into the lungs every 4 hours as needed for Shortness of Breath 360 mL 0   ??? magnesium hydroxide (MILK OF MAGNESIA) 400 MG/5ML suspension Take 30 mLs by mouth daily as needed for Constipation     ??? metFORMIN (GLUCOPHAGE) 500 MG tablet Take 1 tablet by mouth 2 times daily (with meals) 60 tablet 0   ??? montelukast (SINGULAIR) 10 MG tablet Take 1 tablet by mouth nightly 30 tablet 0   ??? rOPINIRole (REQUIP) 4 MG tablet Take 1 tablet by mouth 3 times daily 90 tablet 0   ??? sennosides-docusate sodium (SENOKOT-S) 8.6-50 MG tablet Take 1 tablet by mouth daily 30 tablet 0   ??? tiotropium (SPIRIVA HANDIHALER) 18 MCG inhalation capsule Inhale 1 capsule into the lungs daily 30 capsule 0   ??? paliperidone palmitate (INVEGA SUSTENNA) 234 MG/1.5ML SUSP IM injection Inject 234 mg into the muscle every 30 days       No current facility-administered medications for this visit.         ALLERGIES:   No Known Allergies                              REVIEW OF SYSTEMS    CONSTITUTIONAL Weight: absent, Appetite: absent, Fatigue: absent      HEENT Ears: normal, Visual disturbance: absent   RESPIRATORY Shortness of breath: absent, Cough: absent   CARDIOVASCULAR Chest pain: absent, Leg swelling :absent      GI Constipation: absent, Diarrhea: absent, Swallowing change: absent      GU Urinary frequency: absent, Urinary urgency: absent,   MUSCULOSKELETAL Neck pain: absent, Back pain: absent, Stiffness: absent, Muscle pain: absent, Joint pain: absent Restless legs: absent   DERMATOLOGIC Hair loss: absent, Skin changes: absent   NEUROLOGIC Memory loss: absent, Confusion: absent, Seizures: absent Trouble walking or imbalance: absent, Dizziness: absent, Weakness: absent, Numbness: absent Tremor: present, Spasm: absent, Speech difficulty: absent, Headache: absent, Light sensitivity:  absent   PSYCHIATRIC Anxiety: present, Hallucination: absent, Depression: present   HEMATOLOGIC Abnormal bleeding: absent, Anemia: absent,            PHYSICAL EXAMINATION:       There were no vitals filed for this visit.                                           Marland Kitchen  General Appearance:  Alert, cooperative, no signs of distress, appears stated age   Head:  Normocephalic, no signs of trauma   Eyes:  Conjunctiva/corneas clear;  eyelids intact   Ears:  Normal external ear and canals   Nose: Nares normal, mucosa normal, no drainage    Throat: Lips and tongue normal; teeth normal;  gums normal   Neck: Supple, intact flexion, extension and rotation;   trachea midline;  no adenopathy;   thyroid: not enlarged;   no carotid pulse abnormality   Back:   Symmetric, no curvature, ROM adequate   Lungs:   Respirations unlabored   Heart:  Regular rate and rhythm           Extremities: Extremities normal, no cyanosis, no edema   Pulses: Symmetric over head and neck   Skin: Skin color, texture normal, no rashes, no lesions                                     NEUROLOGIC EXAMINATION    Neurologic Exam  Mental status    Alert and oriented x 3; intact memory with no confusion, speech or language problems; no hallucinations or delusions  Fund of information appropriate for level of education    Cranial nerves    II - visual fields intact to confrontation bilaterally  III, IV, VI - extra-ocular muscles full: no pupillary defect; no INO, no nystagmus, no ptosis   V - normal facial sensation                                                               VII - normal facial symmetry                                                             VIII - intact hearing                                                                             IX, X - symmetrical palate                                                                  XI - symmetrical  shoulder shrug  XII - tongue midline without atrophy or fasciculation      Motor function  Normal muscle bulk and tone; strength 5/5 on all 4 extremities, no pronator drift      Sensory function Intact to light touch, pinprick, vibration, proprioception on all 4 extremities      Cerebellar Intact fine motor movement. No involuntary movements or tremors. No ataxia or dysmetria on finger to nose or heel to shin testing      Reflex function DTR 2+ on bilateral UE and LE, symmetric. Negative Babinski      Gait                   normal base and arm swing                  ASSESSMENT AND PLAN:           In summary, your patient, Shannon Allison exhibits the following, with associated plan:    1. Parkinson's disease with bilateral tremor at rest, shuffling gait, masslike face, and bradykinesia.  1. Titrate Sinemet to 25/100 mg, 2 tablets 3 times daily  2. Continue Requip 4 mg 3 times daily  3. Return in follow-up in 2 months for reevaluation of the elevated dose of Sinemet.  4. The patient's mother will begin collecting medical records for her transition to [place].            Signed: Darcella Cheshireheryl Trea Carnegie, CNP      *Please note that portions of this note were completed with a voice recognition program.  Although every effort was made to insure the accuracy of this automated transcription, some errors in transcription may have occurred, occasionally words and are mis-transcribed

## 2018-03-12 MED ORDER — ROPINIROLE HCL 4 MG PO TABS
4 MG | ORAL_TABLET | Freq: Three times a day (TID) | ORAL | 5 refills | Status: DC
Start: 2018-03-12 — End: 2018-06-21

## 2018-03-12 NOTE — Telephone Encounter (Signed)
Pt's next appt is scheduled for 04/29/18.

## 2018-04-15 NOTE — Telephone Encounter (Signed)
Pt called in to let us know that she was in the ER while in [place]. She was having some tremors that woke her up while she was sleeping. Her mother had taken her to the ER and they just gave her Benadryl, that seemed to help, and they told her to follow up with her neurologist when she gets back to [place]. She has been taking the Benadryl while at home since yesterday afternoon. She has an appt with you on 04/18/18.

## 2018-04-18 ENCOUNTER — Ambulatory Visit: Admit: 2018-04-18 | Discharge: 2018-04-18 | Payer: MEDICAID | Attending: Adult Health | Primary: Nurse Practitioner

## 2018-04-18 DIAGNOSIS — G20 Parkinson's disease: Secondary | ICD-10-CM

## 2018-04-18 MED ORDER — CARBIDOPA-LEVODOPA 25-100 MG PO TABS
25-100 MG | ORAL_TABLET | Freq: Four times a day (QID) | ORAL | 5 refills | Status: AC
Start: 2018-04-18 — End: 2018-05-18

## 2018-04-18 NOTE — Progress Notes (Signed)
[facility]            [address], [address]          [place], [place] [address]          Dept: [phone]          Dept Fax: [phone]        E. Roanna Epley, MD           Jacqlyn Krauss, MD         Ahmed B. Georgetta Haber, MD                   Magda Bernheim, MD              Magda Paganini, MD                Darcella Cheshire, CNP            04/18/2018      HISTORY OF PRESENT ILLNESS:       I had the pleasure of seeing Shannon Allison, who returns for continuing neurologic care.  Patient is a 58 year old woman who was seen last on February 25, 2018 for evaluation of Parkinson's disease.  The patient was seen initially in November 2017 for evaluation of tremor.  She has a past medical history consistent with diabetes mellitus type 2, hypertension, dyslipidemia, schizophrenia, and bipolar disorder.  She lives in a group home currently because of schizophrenia.  There are plans for her to move to [place] to live with her mother in May 2020.  The patient's symptoms consist of tremor which is worse on the left than the right.  The tremor is at rest and during ambulation.  She has a shuffling gait, mild masklike face, cogwheel rigidity, and bradykinesia.  There is no postural instability.  The patient is somewhat impulsive during her ambulation due to her psychiatric disease which oftentimes makes her gait difficult to evaluate.  The patient was initially started on Requip and titrated to 4 mg 3 times daily and because of her frustration, she was then placed on carbidopa levodopa 25/100 mg 3 times daily despite her young age.  At her last visit, she was titrated to carbidopa levodopa 25/100 mg, 2 tablets 3 times daily.  She is here today for reevaluation stating that she has had some improvement in her symptoms, but when questioned, she is taking her medications at 8 in the morning, 2 in the afternoon, and at bedtime.  She is unable to verbalize whether she has any wearing off after her  dosages.  While visiting her mother in [place], the patient awoke in the middle of the night having severe spasms of her arms bilaterally.  She went to the emergency department when the spasms became uncontrollable and they prescribed Benadryl.  She did have side effects from the Benadryl, but after 2 days, her symptoms resolved.                  Prior testing reviewed:      -DAT scan 03/24/16 showing decreased up take in her left putamen compared to the right which is consistent with Parkinson's disease  -MRI brain with and without contrast 03/24/16 normal          PAST MEDICAL HISTORY:         Diagnosis Date   . Asthma    . COPD (chronic obstructive pulmonary disease) (HCC)    . Diabetes mellitus (HCC)    . Hyperlipidemia    .  Hypertension    . Parkinson disease (HCC)    . Pneumonia 12/2016        PAST SURGICAL HISTORY:         Procedure Laterality Date   . NASAL SEPTUM SURGERY Bilateral    . PR COLON CA SCRN NOT HI RSK IND  01/04/2016    COLONOSCOPY performed by Link Snuffer, MD at [facility]   . PR COLON CA SCRN NOT HI RSK IND N/A 02/10/2016    COLONOSCOPY; incomplete; aborted due to poor prep performed by Link Snuffer, MD at [facility]   . PR ESOPHAGOGASTRODUODENOSCOPY TRANSORAL DIAGNOSTIC N/A 01/04/2016    EGD ESOPHAGOGASTRODUODENOSCOPY performed by Link Snuffer, MD at [facility]        SOCIAL HISTORY:     Social History     Socioeconomic History   . Marital status: Single     Spouse name: Not on file   . Number of children: Not on file   . Years of education: Not on file   . Highest education level: Not on file   Occupational History   . Not on file   Social Needs   . Financial resource strain: Not on file   . Food insecurity:     Worry: Not on file     Inability: Not on file   . Transportation needs:     Medical: Not on file     Non-medical: Not on file   Tobacco Use   . Smoking status: Current Every Day Smoker     Packs/day: 0.25   . Smokeless tobacco: Never Used   Substance  and Sexual Activity   . Alcohol use: No   . Drug use: No   . Sexual activity: Not on file   Lifestyle   . Physical activity:     Days per week: Not on file     Minutes per session: Not on file   . Stress: Not on file   Relationships   . Social connections:     Talks on phone: Not on file     Gets together: Not on file     Attends religious service: Not on file     Active member of club or organization: Not on file     Attends meetings of clubs or organizations: Not on file     Relationship status: Not on file   . Intimate partner violence:     Fear of current or ex partner: Not on file     Emotionally abused: Not on file     Physically abused: Not on file     Forced sexual activity: Not on file   Other Topics Concern   . Not on file   Social History Narrative   . Not on file       CURRENT MEDICATIONS:     Current Outpatient Medications   Medication Sig Dispense Refill   . carbidopa-levodopa (SINEMET) 25-100 MG per tablet Take 2 tablets by mouth 4 times daily 8 am, 12 pm, 4pm and 8 pm 720 tablet 5   . rOPINIRole (REQUIP) 4 MG tablet TAKE 1 TABLET BY MOUTH 3 TIMES DAILY 84 tablet 5   . famotidine (PEPCID) 20 MG tablet Take 20 mg by mouth 2 times daily     . LORazepam (ATIVAN) 1 MG tablet Take 1 mg by mouth 3 times daily.     . sucralfate (CARAFATE) 1 GM tablet Take 1 g by  mouth 4 times daily     . QUEtiapine (SEROQUEL) 100 MG tablet Take 100 mg by mouth nightly     . atorvastatin (LIPITOR) 40 MG tablet Take 1 tablet by mouth nightly 30 tablet 0   . QUEtiapine (SEROQUEL) 25 MG tablet Take 2 tablets by mouth nightly as needed for Other (Sleep) (Patient taking differently: Take 25 mg by mouth Take 2 tablets at night.) 60 tablet 0   . aspirin 325 MG EC tablet Take 1 tablet by mouth daily (Patient taking differently: Take 81 mg by mouth daily ) 30 tablet 0   . celecoxib (CELEBREX) 100 MG capsule Take 1 capsule by mouth 2 times daily (Patient taking differently: Take 100 mg by mouth daily ) 60 capsule 0   . lisinopril  (PRINIVIL;ZESTRIL) 10 MG tablet Take 0.5 tablets by mouth daily Patient to continue to take her Lisinopril. 30 tablet 0   . carvedilol (COREG) 3.125 MG tablet Take 1 tablet by mouth 2 times daily 60 tablet 0   . acetaminophen (TYLENOL) 325 MG tablet Take 2 tablets by mouth every 4 hours as needed for Pain or Fever 30 tablet 0   . albuterol (PROVENTIL) (2.5 MG/3ML) 0.083% nebulizer solution Take 3 mLs by nebulization every 4 hours as needed for Wheezing or Shortness of Breath 120 mL 0   . desvenlafaxine succinate (PRISTIQ) 100 MG TB24 extended release tablet Take 1 tablet by mouth daily 30 tablet 0   . esomeprazole Magnesium (NEXIUM) 40 MG PACK Take 1 packet by mouth daily 30 packet 3   . fluticasone (FLONASE) 50 MCG/ACT nasal spray 1 spray by Nasal route daily 1 Bottle 3   . hydrOXYzine (VISTARIL) 25 MG capsule Take 1 capsule by mouth 3 times daily as needed for Itching 60 capsule 0   . ipratropium-albuterol (DUONEB) 0.5-2.5 (3) MG/3ML SOLN nebulizer solution Inhale 3 mLs into the lungs every 4 hours as needed for Shortness of Breath 360 mL 0   . magnesium hydroxide (MILK OF MAGNESIA) 400 MG/5ML suspension Take 30 mLs by mouth daily as needed for Constipation     . metFORMIN (GLUCOPHAGE) 500 MG tablet Take 1 tablet by mouth 2 times daily (with meals) 60 tablet 0   . montelukast (SINGULAIR) 10 MG tablet Take 1 tablet by mouth nightly 30 tablet 0   . rOPINIRole (REQUIP) 4 MG tablet Take 1 tablet by mouth 3 times daily 90 tablet 0   . sennosides-docusate sodium (SENOKOT-S) 8.6-50 MG tablet Take 1 tablet by mouth daily 30 tablet 0   . tiotropium (SPIRIVA HANDIHALER) 18 MCG inhalation capsule Inhale 1 capsule into the lungs daily 30 capsule 0   . paliperidone palmitate (INVEGA SUSTENNA) 234 MG/1.5ML SUSP IM injection Inject 234 mg into the muscle every 21 days      . folic acid (FOLVITE) 1 MG tablet Take 1 tablet by mouth daily (Patient not taking: Reported on 04/18/2018) 30 tablet 0     No current facility-administered  medications for this visit.         ALLERGIES:   No Known Allergies                              REVIEW OF SYSTEMS    CONSTITUTIONAL Weight: absent, Appetite: absent, Fatigue: absent      HEENT Ears: normal, Visual disturbance: absent   RESPIRATORY Shortness of breath: absent, Cough: absent   CARDIOVASCULAR Chest pain: absent, Leg swelling :absent  GI Constipation: absent, Diarrhea: absent, Swallowing change: absent      GU Urinary frequency: absent, Urinary urgency: absent,   MUSCULOSKELETAL Neck pain: absent, Back pain: absent, Stiffness: absent, Muscle pain: absent, Joint pain: absent Restless legs: absent   DERMATOLOGIC Hair loss: absent, Skin changes: absent   NEUROLOGIC Memory loss: absent, Confusion: absent, Seizures: absent Trouble walking or imbalance: absent, Dizziness: absent, Weakness: absent, Numbness: absent Tremor: present, Spasm: absent, Speech difficulty: present, Headache: absent, Light sensitivity: absent   PSYCHIATRIC Anxiety: absent, Hallucination: absent, Mood disorder: present   HEMATOLOGIC Abnormal bleeding: absent, Anemia: absent,            PHYSICAL EXAMINATION:       Vitals:    04/18/18 1139   BP: 112/79   Pulse: 111                                              .                                                                                                    General Appearance:  Alert, cooperative, no signs of distress, appears stated age   Head:  Normocephalic, no signs of trauma   Eyes:  Conjunctiva/corneas clear;  eyelids intact   Ears:  Normal external ear and canals   Nose: Nares normal, mucosa normal, no drainage    Throat: Lips and tongue normal; teeth normal;  gums normal   Neck: Supple, intact flexion, extension and rotation;   trachea midline;  no adenopathy;   thyroid: not enlarged;   no carotid pulse abnormality   Back:   Symmetric, no curvature, ROM adequate   Lungs:   Respirations unlabored   Heart:  Regular rate and rhythm           Extremities: Extremities normal, no  cyanosis, no edema   Pulses: Symmetric over head and neck   Skin: Skin color, texture normal, no rashes, no lesions                                     NEUROLOGIC EXAMINATION    Neurologic Exam  Mental status    Alert and oriented x 3; intact memory with no confusion, speech or language problems; no hallucinations or delusions  Fund of information appropriate for level of education    Cranial nerves    II - visual fields intact to confrontation bilaterally  III, IV, VI - extra-ocular muscles full: no pupillary defect; no INO, no nystagmus, no ptosis   V - normal facial sensation  VII - normal facial symmetry                                                             VIII - intact hearing                                                                             IX, X - symmetrical palate                                                                  XI - symmetrical shoulder shrug                                                       XII - tongue midline without atrophy or fasciculation      Motor function  Normal muscle bulk and tone; strength 5/5 on all 4 extremities, no pronator drift   +Cogwheel rigidity left arm  + bradykinesia   Sensory function Intact to light touch, pinprick, vibration, proprioception on all 4 extremities      Cerebellar Intact fine motor movement. No involuntary movements. No ataxia or dysmetria on finger to nose or heel to shin testing      Reflex function DTR 2+ on bilateral UE and LE, symmetric. Negative Babinski      Gait                   shuffling gait and limited arm swing                  ASSESSMENT AND PLAN:           In summary, your patient, JHAYLA PODGORSKI exhibits the following, with associated plan:    1. Parkinson's disease with symptoms of rest tremor (left greater than right), bradykinesia, shuffling gait, and rigidity.  She had a positive DaTscan  1. Taking DaTscan CD for the patient to take with her to  [place]  2. Titrate carbidopa levodopa to 25/100 mg 2 tablets 4 times daily.  The patient was advised as well as staff in her group home to take the medications at 8 AM, 12 PM, 4 PM, and 8 PM.  The patient will try to determine if there is any fluctuations in her symptoms related to her medication administration.  Her mother was contacted in [place] to discuss today's visit.  She will be seeing a movement disorder specialist when she arrives in [place] in May.  3. Continue Requip 4 mg 3 times daily  4. The patient will be seen back in 2 months for reevaluation of her dose escalation.  5.  The patient's mother will also try to have the patient attend  physical therapy  specific for Parkinson's disease.            Signed: Darcella Cheshire, CNP      *Please note that portions of this note were completed with a voice recognition program.  Although every effort was made to insure the accuracy of this automated transcription, some errors in transcription may have occurred, occasionally words and are mis-transcribed

## 2018-04-29 ENCOUNTER — Encounter: Attending: Adult Health | Primary: Nurse Practitioner

## 2018-05-08 ENCOUNTER — Inpatient Hospital Stay: Payer: MEDICAID | Primary: Nurse Practitioner

## 2018-05-08 DIAGNOSIS — Z79899 Other long term (current) drug therapy: Secondary | ICD-10-CM

## 2018-05-08 LAB — HEMOGLOBIN A1C
Estimated Avg Glucose: 128 mg/dL
Hemoglobin A1C: 6.1 % — ABNORMAL HIGH (ref 4.0–6.0)

## 2018-05-08 LAB — BASIC METABOLIC PANEL
Anion Gap: 13 mmol/L (ref 9–17)
BUN: 18 mg/dL (ref 6–20)
CO2: 27 mmol/L (ref 20–31)
Calcium: 9.4 mg/dL (ref 8.6–10.4)
Chloride: 99 mmol/L (ref 98–107)
Creatinine: 0.55 mg/dL (ref 0.50–0.90)
GFR African American: >60 mL/min (ref >60)
GFR Non-African American: >60 mL/min (ref >60)
Glucose: 115 mg/dL — ABNORMAL HIGH (ref 70–99)
Potassium: 4.5 mmol/L (ref 3.7–5.3)
Sodium: 139 mmol/L (ref 135–144)

## 2018-05-08 LAB — LIPID PANEL
Chol/HDL Ratio: 2.4 (ref <5)
Cholesterol: 132 mg/dL (ref <200)
HDL: 56 mg/dL (ref >40)
LDL Cholesterol: 58 mg/dL (ref 0–130)
Triglycerides: 88 mg/dL (ref <150)

## 2018-05-08 LAB — CBC WITH AUTO DIFFERENTIAL
Basophils %: 0 % (ref 0–2)
Basophils Absolute: 0.03 10*3/uL (ref 0.00–0.20)
Eosinophils %: 1 % (ref 1–4)
Eosinophils Absolute: 0.05 10*3/uL (ref 0.00–0.44)
Hematocrit: 43.5 % (ref 36.3–47.1)
Hemoglobin: 13.7 g/dL (ref 11.9–15.1)
Immature Granulocytes %: 0 %
Immature Granulocytes Absolute: 0.03 10*3/uL (ref 0.00–0.30)
Lymphocytes Absolute: 1.38 10*3/uL (ref 1.10–3.70)
Lymphocytes: 19 % — ABNORMAL LOW (ref 24–43)
MCH: 28.5 pg (ref 25.2–33.5)
MCHC: 31.5 g/dL (ref 28.4–34.8)
MCV: 90.4 fL (ref 82.6–102.9)
MPV: 10.3 fL (ref 8.1–13.5)
Monocytes %: 10 % (ref 3–12)
Monocytes Absolute: 0.71 10*3/uL (ref 0.10–1.20)
NRBC Automated: 0 per 100 WBC
Neutrophils Absolute: 5.09 10*3/uL (ref 1.50–8.10)
Platelets: 319 10*3/uL (ref 138–453)
RBC: 4.81 m/uL (ref 3.95–5.11)
RDW: 13.2 % (ref 11.8–14.4)
Seg Neutrophils: 70 % — ABNORMAL HIGH (ref 36–65)
WBC: 7.3 10*3/uL (ref 3.5–11.3)

## 2018-05-15 ENCOUNTER — Ambulatory Visit: Payer: MEDICAID | Primary: Nurse Practitioner

## 2018-06-19 ENCOUNTER — Inpatient Hospital Stay
Admit: 2018-06-19 | Discharge: 2018-06-20 | Disposition: A | Discharge status: Home / Self Care | Payer: MEDICAID | Source: Ambulatory Visit | Admitting: Emergency Medicine

## 2018-06-19 ENCOUNTER — Emergency Department: Admit: 2018-06-19 | Payer: MEDICAID | Primary: Nurse Practitioner

## 2018-06-19 DIAGNOSIS — I4510 Unspecified right bundle-branch block: Secondary | ICD-10-CM

## 2018-06-19 LAB — CBC WITH AUTO DIFFERENTIAL
Basophils %: 0 % (ref 0–2)
Basophils Absolute: <0.03 10*3/uL (ref 0.00–0.20)
Eosinophils %: 1 % (ref 1–4)
Eosinophils Absolute: 0.08 10*3/uL (ref 0.00–0.44)
Hematocrit: 42.9 % (ref 36.3–47.1)
Hemoglobin: 13.6 g/dL (ref 11.9–15.1)
Immature Granulocytes %: 0 %
Immature Granulocytes Absolute: <0.03 10*3/uL (ref 0.00–0.30)
Lymphocytes Absolute: 1.77 10*3/uL (ref 1.10–3.70)
Lymphocytes: 24 % (ref 24–43)
MCH: 28.6 pg (ref 25.2–33.5)
MCHC: 31.7 g/dL (ref 28.4–34.8)
MCV: 90.3 fL (ref 82.6–102.9)
MPV: 9.8 fL (ref 8.1–13.5)
Monocytes %: 9 % (ref 3–12)
Monocytes Absolute: 0.68 10*3/uL (ref 0.10–1.20)
NRBC Automated: 0 per 100 WBC
Neutrophils Absolute: 4.91 10*3/uL (ref 1.50–8.10)
Platelets: 315 10*3/uL (ref 138–453)
RBC: 4.75 m/uL (ref 3.95–5.11)
RDW: 12.8 % (ref 11.8–14.4)
Seg Neutrophils: 66 % — ABNORMAL HIGH (ref 36–65)
WBC: 7.5 10*3/uL (ref 3.5–11.3)

## 2018-06-19 LAB — BASIC METABOLIC PANEL
Anion Gap: 12 mmol/L (ref 9–17)
BUN: 16 mg/dL (ref 6–20)
CO2: 28 mmol/L (ref 20–31)
Calcium: 9.4 mg/dL (ref 8.6–10.4)
Chloride: 100 mmol/L (ref 98–107)
Creatinine: 0.5 mg/dL (ref 0.50–0.90)
GFR African American: >60 mL/min (ref >60)
GFR Non-African American: >60 mL/min (ref >60)
Glucose: 98 mg/dL (ref 70–99)
Potassium: 4.6 mmol/L (ref 3.7–5.3)
Sodium: 140 mmol/L (ref 135–144)

## 2018-06-19 LAB — TROPONIN
Troponin, High Sensitivity: 10 ng/L (ref 0–14)
Troponin, High Sensitivity: 8 ng/L (ref 0–14)

## 2018-06-19 LAB — D-DIMER, QUANTITATIVE: D-Dimer, Quant: 0.97 mg/L FEU

## 2018-06-19 MED ORDER — ACETAMINOPHEN 325 MG PO TABS
325 | ORAL | Status: DC | PRN
Start: 2018-06-19 — End: 2018-06-20
  Administered 2018-06-20 (×2): 650 mg via ORAL

## 2018-06-19 MED ORDER — PANTOPRAZOLE SODIUM 20 MG PO TBEC
20 MG | Freq: Every day | ORAL | Status: DC
Start: 2018-06-19 — End: 2018-06-20
  Administered 2018-06-20: 21:00:00 40 mg via ORAL

## 2018-06-19 MED ORDER — LORAZEPAM 1 MG PO TABS
1 MG | Freq: Three times a day (TID) | ORAL | Status: DC
Start: 2018-06-19 — End: 2018-06-20
  Administered 2018-06-20 (×2): 1 mg via ORAL

## 2018-06-19 MED ORDER — ATORVASTATIN CALCIUM 40 MG PO TABS
40 MG | Freq: Every evening | ORAL | Status: DC
Start: 2018-06-19 — End: 2018-06-20
  Administered 2018-06-20: 01:00:00 40 mg via ORAL

## 2018-06-19 MED ORDER — ASPIRIN 81 MG PO TBEC
81 MG | Freq: Every day | ORAL | Status: DC
Start: 2018-06-19 — End: 2018-06-20
  Administered 2018-06-20: 21:00:00 81 mg via ORAL

## 2018-06-19 MED ORDER — CARBIDOPA-LEVODOPA 25-100 MG PO TABS
25-100 MG | Freq: Four times a day (QID) | ORAL | Status: DC
Start: 2018-06-19 — End: 2018-06-20
  Administered 2018-06-20 (×2): 2 via ORAL

## 2018-06-19 MED ORDER — KETOROLAC TROMETHAMINE 30 MG/ML IJ SOLN
30 MG/ML | Freq: Once | INTRAMUSCULAR | Status: AC
Start: 2018-06-19 — End: 2018-06-19
  Administered 2018-06-19: 21:00:00 30 mg via INTRAVENOUS

## 2018-06-19 MED ORDER — SUCRALFATE 1 G PO TABS
1 GM | Freq: Four times a day (QID) | ORAL | Status: DC
Start: 2018-06-19 — End: 2018-06-20
  Administered 2018-06-20 (×2): 1 g via ORAL

## 2018-06-19 MED ORDER — CARVEDILOL 3.125 MG PO TABS
3.125 MG | Freq: Two times a day (BID) | ORAL | Status: DC
Start: 2018-06-19 — End: 2018-06-20
  Administered 2018-06-20 (×2): 3.125 mg via ORAL

## 2018-06-19 MED ORDER — FAMOTIDINE 20 MG PO TABS
20 MG | Freq: Two times a day (BID) | ORAL | Status: DC
Start: 2018-06-19 — End: 2018-06-19

## 2018-06-19 MED ORDER — NORMAL SALINE FLUSH 0.9 % IV SOLN
0.9 | Freq: Two times a day (BID) | INTRAVENOUS | Status: DC
Start: 2018-06-19 — End: 2018-06-20

## 2018-06-19 MED ORDER — ONDANSETRON HCL 4 MG/2ML IJ SOLN
4 | Freq: Three times a day (TID) | INTRAMUSCULAR | Status: DC | PRN
Start: 2018-06-19 — End: 2018-06-20

## 2018-06-19 MED ORDER — MONTELUKAST SODIUM 10 MG PO TABS
10 MG | Freq: Every evening | ORAL | Status: DC
Start: 2018-06-19 — End: 2018-06-20
  Administered 2018-06-20: 01:00:00 10 mg via ORAL

## 2018-06-19 MED ORDER — QUETIAPINE FUMARATE 25 MG PO TABS
25 MG | Freq: Every evening | ORAL | Status: DC
Start: 2018-06-19 — End: 2018-06-20
  Administered 2018-06-20: 01:00:00 50 mg via ORAL

## 2018-06-19 MED ORDER — HYDROXYZINE HCL 25 MG PO TABS
25 MG | Freq: Three times a day (TID) | ORAL | Status: DC | PRN
Start: 2018-06-19 — End: 2018-06-20

## 2018-06-19 MED ORDER — DESVENLAFAXINE SUCCINATE ER 50 MG PO TB24
50 MG | Freq: Every day | ORAL | Status: DC
Start: 2018-06-19 — End: 2018-06-20
  Administered 2018-06-20: 21:00:00 100 mg via ORAL

## 2018-06-19 MED ORDER — TIOTROPIUM BROMIDE MONOHYDRATE 2.5 MCG/ACT IN AERS
2.5 MCG/ACT | Freq: Every day | RESPIRATORY_TRACT | Status: DC
Start: 2018-06-19 — End: 2018-06-20
  Administered 2018-06-20: 12:00:00 2 via RESPIRATORY_TRACT

## 2018-06-19 MED ORDER — IOHEXOL 350 MG/ML IV SOLN
350 MG/ML | Freq: Once | INTRAVENOUS | Status: AC | PRN
Start: 2018-06-19 — End: 2018-06-19
  Administered 2018-06-19: 20:00:00 75 mL via INTRAVENOUS

## 2018-06-19 MED ORDER — CELECOXIB 100 MG PO CAPS
100 MG | Freq: Every day | ORAL | Status: DC
Start: 2018-06-19 — End: 2018-06-20
  Administered 2018-06-20: 21:00:00 100 mg via ORAL

## 2018-06-19 MED ORDER — NORMAL SALINE FLUSH 0.9 % IV SOLN
0.9 | INTRAVENOUS | Status: DC | PRN
Start: 2018-06-19 — End: 2018-06-20

## 2018-06-19 MED ORDER — ALBUTEROL SULFATE (2.5 MG/3ML) 0.083% IN NEBU
RESPIRATORY_TRACT | Status: DC | PRN
Start: 2018-06-19 — End: 2018-06-20

## 2018-06-19 MED ORDER — ROPINIROLE HCL 1 MG PO TABS
1 MG | Freq: Three times a day (TID) | ORAL | Status: DC
Start: 2018-06-19 — End: 2018-06-20
  Administered 2018-06-20 (×2): 4 mg via ORAL

## 2018-06-19 MED ORDER — ENOXAPARIN SODIUM 40 MG/0.4ML SC SOLN
40 | Freq: Every day | SUBCUTANEOUS | Status: DC
Start: 2018-06-19 — End: 2018-06-20
  Administered 2018-06-20: 01:00:00 40 mg via SUBCUTANEOUS

## 2018-06-19 MED ORDER — FOLIC ACID 1 MG PO TABS
1 MG | Freq: Every day | ORAL | Status: DC
Start: 2018-06-19 — End: 2018-06-20
  Administered 2018-06-20: 21:00:00 1 mg via ORAL

## 2018-06-19 MED ORDER — IPRATROPIUM-ALBUTEROL 0.5-2.5 (3) MG/3ML IN SOLN
RESPIRATORY_TRACT | Status: DC | PRN
Start: 2018-06-19 — End: 2018-06-20

## 2018-06-19 MED FILL — FOLIC ACID 1 MG PO TABS: 1 mg | ORAL | Qty: 1

## 2018-06-19 MED FILL — HYDROXYZINE HCL 25 MG PO TABS: 25 mg | ORAL | Qty: 1

## 2018-06-19 MED FILL — CARVEDILOL 3.125 MG PO TABS: 3.125 mg | ORAL | Qty: 1

## 2018-06-19 MED FILL — CELECOXIB 100 MG PO CAPS: 100 mg | ORAL | Qty: 1

## 2018-06-19 MED FILL — MONTELUKAST SODIUM 10 MG PO TABS: 10 mg | ORAL | Qty: 1

## 2018-06-19 MED FILL — PANTOPRAZOLE SODIUM 20 MG PO TBEC: 20 mg | ORAL | Qty: 2

## 2018-06-19 MED FILL — PRISTIQ 50 MG PO TB24: 50 mg | ORAL | Qty: 2

## 2018-06-19 MED FILL — ENOXAPARIN SODIUM 40 MG/0.4ML SC SOLN: 40 MG/0.4ML | SUBCUTANEOUS | Qty: 0.4

## 2018-06-19 MED FILL — TIOTROPIUM BROMIDE MONOHYDRATE 2.5 MCG/ACT IN AERS: 2.5 MCG/ACT | RESPIRATORY_TRACT | Qty: 0.33

## 2018-06-19 MED FILL — KETOROLAC TROMETHAMINE 30 MG/ML IJ SOLN: 30 mg/mL | INTRAMUSCULAR | Qty: 1

## 2018-06-19 MED FILL — ASPIRIN EC 81 MG PO TBEC: 81 mg | ORAL | Qty: 1

## 2018-06-19 MED FILL — CARBIDOPA-LEVODOPA 25-100 MG PO TABS: 25-100 mg | ORAL | Qty: 2

## 2018-06-19 MED FILL — SUCRALFATE 1 G PO TABS: 1 g | ORAL | Qty: 1

## 2018-06-19 MED FILL — ROPINIROLE HCL 1 MG PO TABS: 1 mg | ORAL | Qty: 4

## 2018-06-19 MED FILL — QUETIAPINE FUMARATE 25 MG PO TABS: 25 mg | ORAL | Qty: 2

## 2018-06-19 MED FILL — ATORVASTATIN CALCIUM 40 MG PO TABS: 40 mg | ORAL | Qty: 1

## 2018-06-19 NOTE — ED Notes (Signed)
Pt to ct by stretcher.      Araceli Bouche, RN  06/19/18 1606

## 2018-06-19 NOTE — Care Coordination-Inpatient (Signed)
Case Management Initial Discharge Plan  Shannon Allison,             Met with:patient to discuss discharge plans.   Information verified: address, contacts, phone number, DOB, insurance Yes  PCP: Ayesha Mohair, APRN - CNP  Date of last visit: Today    Insurance Provider: Asher Muir Dual    Discharge Planning    Living Arrangements:  Other (Comment)   Support Systems:  Other (Comment)    Home has 1 stories  5 stairs to climb to get into front door, 0stairs to climb to reach second floor  Location of bedroom/bathroom in home main     Patient able to perform ADL's:Independent    Current Services (outpatient & in home) Group home  DME equipment: na  DME provider: na      Potential Assistance Needed:  N/A    Patient agreeable to home care: No  Freedom of choice provided:  n/a    Prior SNF/Rehab Placement and Facility: none  Agreeable to SNF/Rehab: No  Freedom of choice provided: n/a  Social Services Evaluation: n/a    Expected Discharge date:     Patient expects to be discharged to:  Back to Group home  Follow Up Appointment: Best Day/ Time:      Transportation provider: Cab   Transportation arrangements needed for discharge: Yes, cab arranged     Readmission Risk              Risk of Unplanned Readmission:        0             Does patient have a readmission risk score greater than 14?: UTA  If yes, follow-up appointment must be made within 7 days of discharge.     Goals of Care: Go home       Discharge Plan: Back to group home in cab          Electronically signed by Cecil Cranker, RN on 06/19/18 at 6:15 PM EDT

## 2018-06-19 NOTE — ED Notes (Signed)
Pt placed on full cardiac monitor.      Araceli Bouche, RN  06/19/18 1505

## 2018-06-19 NOTE — ED Notes (Signed)
Dr. Glory Buff at bedside to assess patient.      Araceli Bouche, RN  06/19/18 [phone]

## 2018-06-19 NOTE — ED Provider Notes (Signed)
[place] [facility] ED     Emergency Department     Faculty Attestation        I performed a history and physical examination of the patient and discussed management with the resident. I reviewed the resident???s note and agree with the documented findings and plan of care. Any areas of disagreement are noted on the chart. I was personally present for the key portions of any procedures. I have documented in the chart those procedures where I was not present during the key portions. I have reviewed the emergency nurses triage note. I agree with the chief complaint, past medical history, past surgical history, allergies, medications, social and family history as documented unless otherwise noted below.    For mid-level providers such as nurse practitioners as well as physicians assistants:    I have personally seen and evaluated the patient.    I find the patient's history and physical exam are consistent with NP/PA documentation.  I agree with the care provided, treatment rendered, disposition, & follow-up plan.     Additional findings are as noted.    Vital Signs: BP 111/87    Pulse 70    Temp 97.5 ??F (36.4 ??C) (Oral)    Resp 16    SpO2 98%   PCP:  Anise Salvo, APRN - CNP    Pertinent Comments:           Critical Care  None          Titus Dubin, MD  Attending Emergency Medicine Physician              Vida Roller, MD  06/19/18 [phone]

## 2018-06-19 NOTE — ED Provider Notes (Signed)
[facility] ED  Emergency Department Encounter  EmergencyMedicine Resident     Pt Name:Shannon Allison  MRN: [id number]  Birthdate Nov 29, 1960  Date of evaluation: 06/19/18  PCP:  Anise Salvo, APRN - CNP    CHIEF COMPLAINT       Chief Complaint   Patient presents with   ??? Chest Pain     pt with c/o midsternal chest pain x several days. began while sitting at home. pt was at her pcp office today for her routine check up and told them she was having chest pain. ems was called, pt transferred [facility]. pt given 324 mg asprin pta       HISTORY OF PRESENT ILLNESS  (Location/Symptom, Timing/Onset, Context/Setting, Quality, Duration, Modifying Factors, Severity.)      Shannon Allison is a 58 y.o. female who presents with midsternal chest pain with radiation to the right arm which is been ongoing for 2 to 3 days.  She states this is not associated with any shortness of breath, nausea, vomiting, diaphoresis.  She states it is worsened with inspiration.  She also admits to a dry cough.  She has a past history of hyperlipidemia, hypertension, diabetes mellitus, Parkinson's disease, and COPD.  She admits to daily tobacco use.  Denies any crack/cocaine use.  Denies any history of DVT, PE, recent surgery, long travel, calf swelling, hemoptysis.  States that she is taking all of her medication as prescribed.  States that she experiences her symptoms in August 2019.    PAST MEDICAL / SURGICAL / SOCIAL / FAMILY HISTORY      has a past medical history of Asthma, COPD (chronic obstructive pulmonary disease) (HCC), Diabetes mellitus (HCC), Hyperlipidemia, Hypertension, Parkinson disease (HCC), and Pneumonia.     has a past surgical history that includes Nasal septum surgery (Bilateral); pr esophagogastroduodenoscopy transoral diagnostic (N/A, 01/04/2016); pr colon ca scrn not hi rsk ind (01/04/2016); and pr colon ca scrn not hi rsk ind (N/A, 02/10/2016).    Social History     Socioeconomic History   ??? Marital  status: Single     Spouse name: Not on file   ??? Number of children: Not on file   ??? Years of education: Not on file   ??? Highest education level: Not on file   Occupational History   ??? Not on file   Social Needs   ??? Financial resource strain: Not on file   ??? Food insecurity     Worry: Not on file     Inability: Not on file   ??? Transportation needs     Medical: Not on file     Non-medical: Not on file   Tobacco Use   ??? Smoking status: Current Every Day Smoker     Packs/day: 0.25   ??? Smokeless tobacco: Never Used   Substance and Sexual Activity   ??? Alcohol use: No   ??? Drug use: No   ??? Sexual activity: Not on file   Lifestyle   ??? Physical activity     Days per week: Not on file     Minutes per session: Not on file   ??? Stress: Not on file   Relationships   ??? Social Wellsite geologist on phone: Not on file     Gets together: Not on file     Attends religious service: Not on file     Active member of club or organization: Not on file  Attends meetings of clubs or organizations: Not on file     Relationship status: Not on file   ??? Intimate partner violence     Fear of current or ex partner: Not on file     Emotionally abused: Not on file     Physically abused: Not on file     Forced sexual activity: Not on file   Other Topics Concern   ??? Not on file   Social History Narrative   ??? Not on file       Family History   Problem Relation Age of Onset   ??? High Blood Pressure Mother    ??? High Blood Pressure Father        Allergies:  Patient has no known allergies.    Home Medications:  Prior to Admission medications    Medication Sig Start Date End Date Taking? Authorizing Provider   carbidopa-levodopa (SINEMET) 25-100 MG per tablet Take 2 tablets by mouth 4 times daily 8 am, 12 pm, 4pm and 8 pm 04/18/18 05/18/18  Foy Guadalajara, APRN - CNP   rOPINIRole (REQUIP) 4 MG tablet TAKE 1 TABLET BY MOUTH 3 TIMES DAILY 03/12/18   Foy Guadalajara, APRN - CNP   famotidine (PEPCID) 20 MG tablet Take 20 mg by mouth 2 times daily     Historical Provider, MD   LORazepam (ATIVAN) 1 MG tablet Take 1 mg by mouth 3 times daily.    Historical Provider, MD   sucralfate (CARAFATE) 1 GM tablet Take 1 g by mouth 4 times daily    Historical Provider, MD   QUEtiapine (SEROQUEL) 100 MG tablet Take 100 mg by mouth nightly    Historical Provider, MD   atorvastatin (LIPITOR) 40 MG tablet Take 1 tablet by mouth nightly 11/17/17   Stephanie Coup Hauger, DO   QUEtiapine (SEROQUEL) 25 MG tablet Take 2 tablets by mouth nightly as needed for Other (Sleep)  Patient taking differently: Take 25 mg by mouth Take 2 tablets at night. 11/17/17   Stephanie Coup Hauger, DO   aspirin 325 MG EC tablet Take 1 tablet by mouth daily  Patient taking differently: Take 81 mg by mouth daily  11/18/17   Stephanie Coup Hauger, DO   celecoxib (CELEBREX) 100 MG capsule Take 1 capsule by mouth 2 times daily  Patient taking differently: Take 100 mg by mouth daily  11/17/17   Stephanie Coup Hauger, DO   lisinopril (PRINIVIL;ZESTRIL) 10 MG tablet Take 0.5 tablets by mouth daily Patient to continue to take her Lisinopril. 11/17/17   Stephanie Coup Hauger, DO   carvedilol (COREG) 3.125 MG tablet Take 1 tablet by mouth 2 times daily 11/17/17   Stephanie Coup Hauger, DO   acetaminophen (TYLENOL) 325 MG tablet Take 2 tablets by mouth every 4 hours as needed for Pain or Fever 11/17/17   Stephanie Coup Hauger, DO   albuterol (PROVENTIL) (2.5 MG/3ML) 0.083% nebulizer solution Take 3 mLs by nebulization every 4 hours as needed for Wheezing or Shortness of Breath 11/17/17   Stephanie Coup Hauger, DO   desvenlafaxine succinate (PRISTIQ) 100 MG TB24 extended release tablet Take 1 tablet by mouth daily 11/17/17   Stephanie Coup Hauger, DO   esomeprazole Magnesium (NEXIUM) 40 MG PACK Take 1 packet by mouth daily 11/17/17 04/18/18  Stephanie Coup Hauger, DO   fluticasone Aleda Grana) 50 MCG/ACT nasal spray 1 spray by Nasal route daily 11/17/17   Stephanie Coup Hauger, DO   folic acid (FOLVITE) 1 MG tablet Take  1 tablet by mouth  daily  Patient not taking: Reported on 04/18/2018 11/17/17   Stephanie Coup Hauger, DO   hydrOXYzine (VISTARIL) 25 MG capsule Take 1 capsule by mouth 3 times daily as needed for Itching 11/17/17   Stephanie Coup Hauger, DO   ipratropium-albuterol (DUONEB) 0.5-2.5 (3) MG/3ML SOLN nebulizer solution Inhale 3 mLs into the lungs every 4 hours as needed for Shortness of Breath 11/17/17   Stephanie Coup Hauger, DO   magnesium hydroxide (MILK OF MAGNESIA) 400 MG/5ML suspension Take 30 mLs by mouth daily as needed for Constipation 11/17/17   Stephanie Coup Hauger, DO   metFORMIN (GLUCOPHAGE) 500 MG tablet Take 1 tablet by mouth 2 times daily (with meals) 11/17/17   Stephanie Coup Hauger, DO   montelukast (SINGULAIR) 10 MG tablet Take 1 tablet by mouth nightly 11/17/17   Stephanie Coup Hauger, DO   rOPINIRole (REQUIP) 4 MG tablet Take 1 tablet by mouth 3 times daily 11/17/17   Stephanie Coup Hauger, DO   sennosides-docusate sodium (SENOKOT-S) 8.6-50 MG tablet Take 1 tablet by mouth daily 11/17/17   Stephanie Coup Hauger, DO   tiotropium (SPIRIVA HANDIHALER) 18 MCG inhalation capsule Inhale 1 capsule into the lungs daily 11/17/17   Stephanie Coup Hauger, DO   paliperidone palmitate (INVEGA SUSTENNA) 234 MG/1.5ML SUSP IM injection Inject 234 mg into the muscle every 21 days     Historical Provider, MD       REVIEW OF SYSTEMS    (2-9 systems for level 4, 10 or more for level 5)      Review of Systems   Constitutional: Negative for chills, fatigue and fever.   HENT: Negative for congestion, rhinorrhea and sore throat.    Eyes: Negative for visual disturbance.   Respiratory: Positive for cough. Negative for shortness of breath.    Cardiovascular: Positive for chest pain. Negative for leg swelling.   Gastrointestinal: Negative for abdominal pain, nausea and vomiting.   Genitourinary: Negative for dysuria, frequency and hematuria.   Musculoskeletal: Negative for myalgias.   Skin: Negative for rash.   Neurological: Negative for weakness, numbness and  headaches.       PHYSICAL EXAM   (up to 7 for level 4, 8 or more for level 5)      INITIAL VITALS:   BP 121/88    Pulse 69    Temp 97.5 ??F (36.4 ??C) (Oral)    Resp 17    SpO2 94%     Physical Exam  Constitutional:       Appearance: Normal appearance. She is normal weight.   HENT:      Head: Normocephalic and atraumatic.      Mouth/Throat:      Mouth: Mucous membranes are moist.   Eyes:      Extraocular Movements: Extraocular movements intact.      Pupils: Pupils are equal, round, and reactive to light.   Neck:      Musculoskeletal: Normal range of motion.   Cardiovascular:      Rate and Rhythm: Normal rate and regular rhythm.   Pulmonary:      Effort: Pulmonary effort is normal.      Breath sounds: No wheezing, rhonchi or rales.   Abdominal:      General: Abdomen is flat. There is no distension.      Tenderness: There is no abdominal tenderness.   Musculoskeletal:      Right lower leg: No edema.      Left lower leg: No  edema.   Skin:     General: Skin is warm and dry.   Neurological:      Mental Status: She is alert and oriented to person, place, and time.         DIFFERENTIAL  DIAGNOSIS     PLAN (LABS / IMAGING / EKG):  Orders Placed This Encounter   Procedures   ??? XR CHEST STANDARD (2 VW)   ??? CT Chest Pulmonary Embolism W Contrast   ??? CBC WITH AUTO DIFFERENTIAL   ??? BASIC METABOLIC PANEL   ??? Troponin   ??? D-Dimer, Quantitative   ??? EKG 12 Lead   ??? PATIENT STATUS (FROM ED OR OR/PROCEDURAL) Observation       MEDICATIONS ORDERED:  Orders Placed This Encounter   Medications   ??? iohexol (OMNIPAQUE 350) solution 75 mL   ??? ketorolac (TORADOL) injection 30 mg       DDX: ACS, arrhythmia, pneumonia, musculoskeletal chest pain, pulmonary embolism    DIAGNOSTIC RESULTS / EMERGENCY DEPARTMENT COURSE / MDM     LABS:  Results for orders placed or performed during the hospital encounter of 06/19/18   CBC WITH AUTO DIFFERENTIAL   Result Value Ref Range    WBC 7.5 3.5 - 11.3 k/uL    RBC 4.75 3.95 - 5.11 m/uL    Hemoglobin 13.6 11.9 -  15.1 g/dL    Hematocrit 68.3 72.9 - 47.1 %    MCV 90.3 82.6 - 102.9 fL    MCH 28.6 25.2 - 33.5 pg    MCHC 31.7 28.4 - 34.8 g/dL    RDW 02.1 11.5 - 52.0 %    Platelets 315 138 - 453 k/uL    MPV 9.8 8.1 - 13.5 fL    NRBC Automated 0.0 0.0 per 100 WBC    Differential Type NOT REPORTED     Seg Neutrophils 66 (H) 36 - 65 %    Lymphocytes 24 24 - 43 %    Monocytes 9 3 - 12 %    Eosinophils % 1 1 - 4 %    Basophils 0 0 - 2 %    Immature Granulocytes 0 0 %    Segs Absolute 4.91 1.50 - 8.10 k/uL    Absolute Lymph # 1.77 1.10 - 3.70 k/uL    Absolute Mono # 0.68 0.10 - 1.20 k/uL    Absolute Eos # 0.08 0.00 - 0.44 k/uL    Basophils Absolute <0.03 0.00 - 0.20 k/uL    Absolute Immature Granulocyte <0.03 0.00 - 0.30 k/uL    WBC Morphology NOT REPORTED     RBC Morphology NOT REPORTED     Platelet Estimate NOT REPORTED    BASIC METABOLIC PANEL   Result Value Ref Range    Glucose 98 70 - 99 mg/dL    BUN 16 6 - 20 mg/dL    CREATININE 8.02 2.33 - 0.90 mg/dL    Bun/Cre Ratio NOT REPORTED 9 - 20    Calcium 9.4 8.6 - 10.4 mg/dL    Sodium 612 244 - 975 mmol/L    Potassium 4.6 3.7 - 5.3 mmol/L    Chloride 100 98 - 107 mmol/L    CO2 28 20 - 31 mmol/L    Anion Gap 12 9 - 17 mmol/L    GFR Non-African American >60 >60 mL/min    GFR African American >60 >60 mL/min    GFR Comment          GFR Staging NOT REPORTED  Troponin   Result Value Ref Range    Troponin, High Sensitivity 10 0 - 14 ng/L    Troponin T NOT REPORTED <0.03 ng/mL    Troponin Interp NOT REPORTED    Troponin   Result Value Ref Range    Troponin, High Sensitivity 8 0 - 14 ng/L    Troponin T NOT REPORTED <0.03 ng/mL    Troponin Interp NOT REPORTED    D-Dimer, Quantitative   Result Value Ref Range    D-Dimer, Quant 0.97 mg/L FEU       IMPRESSION: Chest pain    RADIOLOGY:  Xr Chest Standard (2 Vw)    Result Date: 06/19/2018  EXAMINATION: TWO XRAY VIEWS OF THE CHEST 06/19/2018 3:17 pm COMPARISON: Chest radiograph performed 11/14/2017. HISTORY: ORDERING SYSTEM PROVIDED HISTORY:  right-sided chest pain TECHNOLOGIST PROVIDED HISTORY: right-sided chest pain Reason for Exam: right sided chest pain Acuity: Unknown Type of Exam: Unknown FINDINGS: There is no acute consolidation or effusion.  There is no pneumothorax.  The mediastinal structures are unremarkable.  The upper abdomen unremarkable. The extrathoracic soft tissues are unremarkable.     No acute cardiopulmonary process.     Ct Chest Pulmonary Embolism W Contrast    Result Date: 06/19/2018  EXAMINATION: CTA OF THE CHEST 06/19/2018 4:03 pm TECHNIQUE: CTA of the chest was performed after the administration of intravenous contrast.  Multiplanar reformatted images are provided for review.  MIP images are provided for review. Dose modulation, iterative reconstruction, and/or weight based adjustment of the mA/kV was utilized to reduce the radiation dose to as low as reasonably achievable. COMPARISON: None. HISTORY: ORDERING SYSTEM PROVIDED HISTORY: chest pain, worse with inspiration, elevated d dimer TECHNOLOGIST PROVIDED HISTORY: chest pain, worse with inspiration, elevated d dimer Reason for Exam: elevated d dimer Acuity: Unknown Type of Exam: Unknown FINDINGS: Pulmonary Arteries: Pulmonary arteries are adequately opacified for evaluation.  No evidence of intraluminal filling defect to suggest pulmonary embolism.  Main pulmonary artery is normal in caliber. Mediastinum: No evidence of mediastinal lymphadenopathy.  The heart and pericardium demonstrate no acute abnormality.  There is no acute abnormality of the thoracic aorta. Lungs/pleura: There is a small nodular infiltrate in the right lower lobe. There is no effusion.  There is no pneumothorax.  There is no dominant pulmonary nodule or pulmonary mass.  The tracheobronchial tree is patent. Upper Abdomen: Limited images of the upper abdomen are unremarkable. Soft Tissues/Bones: No acute bone or soft tissue abnormality.     No acute or chronic pulmonary embolism. Small nodular infiltrate in  the right lower lobe that may represent an early infectious etiology and follow-up chest CT is recommended in 3 months to assess resolution.     EKG  EKG Interpretation    Interpreted by me    Rhythm: normal sinus   Rate: normal  Axis: normal  Ectopy: none  Conduction: normal, PR 150, QRS 96, QTc 477  ST Segments: no acute change  T Waves: no acute change  Q Waves: none    Clinical Impression: Nonspecific EKG, no acute changes    All EKG's are interpreted by the Emergency Department Physician who either signs or Co-signs this chart in the absence of a cardiologist.    EMERGENCY DEPARTMENT COURSE:  Patient is alert and oriented, no acute distress.  Vital signs are within normal limits.  Cardiac work-up was performed.  EKG does not demonstrate any ischemia, infarct, or arrhythmia.  Serial high-sensitivity troponins within normal limits.  Chest x-ray does not demonstrate  any acute process.  Blood counts, electrolytes, kidney function within normal limits.  Due to reports that pain is worsened with inspiration, he obtain d-dimer which was mildly elevated.  CT PE was obtained and does not demonstrate any pulmonary embolism but demonstrated questionable small nodular infiltrate.  Due to elevated heart score, will admit patient to observation unit for cardiology evaluation.    PROCEDURES:  None    CONSULTS:  IP CONSULT TO CARDIOLOGY    CRITICAL CARE:  None    FINAL IMPRESSION      1. Chest pain, unspecified type          DISPOSITION / PLAN     DISPOSITION Admitted 06/19/2018 05:06:40 PM      PATIENT REFERRED TO:  Anise Salvo, APRN - CNP  [address]  [place] [address]  [phone]            DISCHARGE MEDICATIONS:  New Prescriptions    No medications on file       [place] Brigance J Dennette Faulconer D.O.  Emergency Medicine Resident    (Please note that portions ofthis note were completed with a voice recognition program.  Efforts were made to edit the dictations but occasionally words are mis-transcribed.)      Paulino Rily, MD  06/19/18 [phone]

## 2018-06-19 NOTE — ED Notes (Signed)
Dr. Letta Median at bedside to assess patient.      Araceli Bouche, RN  06/19/18 1505

## 2018-06-19 NOTE — ED Notes (Signed)
Bed: 03  Expected date:   Expected time:   Means of arrival:   Comments:     Letitia Neri, RN  06/19/18 1453

## 2018-06-19 NOTE — ED Notes (Signed)
Pt updated on plan of care. Pt to be admitted. Denies needs. Remains on monitor.      Araceli Bouche, RN  06/19/18 [phone]

## 2018-06-20 ENCOUNTER — Encounter: Primary: Nurse Practitioner

## 2018-06-20 ENCOUNTER — Observation Stay: Admit: 2018-06-20 | Payer: MEDICAID | Primary: Nurse Practitioner

## 2018-06-20 ENCOUNTER — Encounter: Attending: Adult Health | Primary: Nurse Practitioner

## 2018-06-20 LAB — EKG 12-LEAD
Atrial Rate: 76 {beats}/min
P Axis: 82 degrees
P-R Interval: 150 ms
Q-T Interval: 424 ms
QRS Duration: 96 ms
QTc Calculation (Bazett): 477 ms
R Axis: -43 degrees
T Axis: 74 degrees
Ventricular Rate: 76 {beats}/min

## 2018-06-20 LAB — NM MYOCARDIAL SPECT REST EXERCISE OR RX: Left Ventricular Ejection Fraction: 50

## 2018-06-20 MED ORDER — ATROPINE SULFATE 1 MG/10ML IJ SOSY
1 MG/0ML | INTRAMUSCULAR | Status: DC | PRN
Start: 2018-06-20 — End: 2018-06-20

## 2018-06-20 MED ORDER — ALBUTEROL SULFATE HFA 108 (90 BASE) MCG/ACT IN AERS
108 (90 Base) MCG/ACT | RESPIRATORY_TRACT | Status: DC | PRN
Start: 2018-06-20 — End: 2018-06-20

## 2018-06-20 MED ORDER — AMINOPHYLLINE 25 MG/ML IV SOLN
25 MG/ML | INTRAVENOUS | Status: DC | PRN
Start: 2018-06-20 — End: 2018-06-20

## 2018-06-20 MED ORDER — SODIUM CHLORIDE 0.9 % IV SOLN
0.9 % | INTRAVENOUS | Status: DC | PRN
Start: 2018-06-20 — End: 2018-06-20

## 2018-06-20 MED ORDER — TECHNETIUM TC 99M SESTAMIBI - CARDIOLITE
Freq: Once | Status: AC | PRN
Start: 2018-06-20 — End: 2018-06-20
  Administered 2018-06-20: 18:00:00 46.7 via INTRAVENOUS

## 2018-06-20 MED ORDER — TECHNETIUM TC 99M SESTAMIBI - CARDIOLITE
Freq: Once | Status: AC | PRN
Start: 2018-06-20 — End: 2018-06-20
  Administered 2018-06-20: 16:00:00 14.4 via INTRAVENOUS

## 2018-06-20 MED ORDER — NORMAL SALINE FLUSH 0.9 % IV SOLN
0.9 % | INTRAVENOUS | Status: DC | PRN
Start: 2018-06-20 — End: 2018-06-20
  Administered 2018-06-20: 18:00:00 10 mL via INTRAVENOUS

## 2018-06-20 MED ORDER — POLYETHYLENE GLYCOL 3350 17 G PO PACK
17 g | Freq: Every day | ORAL | Status: DC
Start: 2018-06-20 — End: 2018-06-20
  Administered 2018-06-20: 21:00:00 17 g via ORAL

## 2018-06-20 MED ORDER — METOPROLOL TARTRATE 5 MG/5ML IV SOLN
55 MG/ML | INTRAVENOUS | Status: DC | PRN
Start: 2018-06-20 — End: 2018-06-20

## 2018-06-20 MED ORDER — NITROGLYCERIN 0.4 MG SL SUBL
0.4 MG | SUBLINGUAL | Status: AC | PRN
Start: 2018-06-20 — End: 2018-06-20
  Administered 2018-06-20 (×3): 0.4 mg via SUBLINGUAL

## 2018-06-20 MED ORDER — NITROGLYCERIN 0.4 MG SL SUBL
0.4 MG | SUBLINGUAL | Status: DC | PRN
Start: 2018-06-20 — End: 2018-06-20

## 2018-06-20 MED ORDER — NORMAL SALINE FLUSH 0.9 % IV SOLN
0.9 % | INTRAVENOUS | Status: DC | PRN
Start: 2018-06-20 — End: 2018-06-20
  Administered 2018-06-20: 16:00:00 10 mL via INTRAVENOUS

## 2018-06-20 MED ORDER — REGADENOSON 0.4 MG/5ML IV SOLN
0.4 MG/5ML | Freq: Once | INTRAVENOUS | Status: AC | PRN
Start: 2018-06-20 — End: 2018-06-20
  Administered 2018-06-20: 16:00:00 0.4 mg via INTRAVENOUS

## 2018-06-20 MED FILL — ROPINIROLE HCL 1 MG PO TABS: 1 mg | ORAL | Qty: 4

## 2018-06-20 MED FILL — NITROGLYCERIN 0.4 MG SL SUBL: 0.4 mg | SUBLINGUAL | Qty: 25

## 2018-06-20 MED FILL — ACETAMINOPHEN 325 MG PO TABS: 325 mg | ORAL | Qty: 2

## 2018-06-20 MED FILL — HYDROXYZINE HCL 25 MG PO TABS: 25 mg | ORAL | Qty: 1

## 2018-06-20 MED FILL — CARBIDOPA-LEVODOPA 25-100 MG PO TABS: 25-100 mg | ORAL | Qty: 2

## 2018-06-20 MED FILL — CARVEDILOL 3.125 MG PO TABS: 3.125 mg | ORAL | Qty: 1

## 2018-06-20 MED FILL — ATORVASTATIN CALCIUM 40 MG PO TABS: 40 mg | ORAL | Qty: 1

## 2018-06-20 MED FILL — SUCRALFATE 1 G PO TABS: 1 g | ORAL | Qty: 1

## 2018-06-20 MED FILL — FOLIC ACID 1 MG PO TABS: 1 mg | ORAL | Qty: 1

## 2018-06-20 MED FILL — PANTOPRAZOLE SODIUM 20 MG PO TBEC: 20 mg | ORAL | Qty: 2

## 2018-06-20 MED FILL — HEALTHYLAX 17 G PO PACK: 17 g | ORAL | Qty: 1

## 2018-06-20 MED FILL — LEXISCAN 0.4 MG/5ML IV SOLN: 0.4 MG/5ML | INTRAVENOUS | Qty: 5

## 2018-06-20 MED FILL — ASPIRIN EC 81 MG PO TBEC: 81 mg | ORAL | Qty: 1

## 2018-06-20 MED FILL — DESVENLAFAXINE SUCCINATE ER 50 MG PO TB24: 50 mg | ORAL | Qty: 2

## 2018-06-20 MED FILL — LORAZEPAM 1 MG PO TABS: 1 mg | ORAL | Qty: 1

## 2018-06-20 MED FILL — QUETIAPINE FUMARATE 25 MG PO TABS: 25 mg | ORAL | Qty: 2

## 2018-06-20 MED FILL — CELECOXIB 100 MG PO CAPS: 100 mg | ORAL | Qty: 1

## 2018-06-20 MED FILL — ENOXAPARIN SODIUM 40 MG/0.4ML SC SOLN: 40 MG/0.4ML | SUBCUTANEOUS | Qty: 0.4

## 2018-06-20 MED FILL — MONTELUKAST SODIUM 10 MG PO TABS: 10 mg | ORAL | Qty: 1

## 2018-06-20 NOTE — Other (Signed)
Assessment: Patient was awake and oriented when chaplain visited. Family was not present at the time. However, patient did make mention of her two siblings. Patient was in good spirit and seemed to be doing well. When asked how she was feeling, patient responded; "okay." Patient said she was raised Catholic but not affiliated with any parish.    Intervention: Chaplain provided presence, offered support and prayed with patient. Patient received sacrament of anointing of the sick.    Outcome: Patient was very grateful and thankful for the anointing and spiritual support she received.     Follow up visits recommended for more prayers and support.

## 2018-06-20 NOTE — Progress Notes (Signed)
[facility]  Documentation Note                Admission Dx: Chest pain [R07.9]    Past Medical History:   has a past medical history of Asthma, COPD (chronic obstructive pulmonary disease) (HCC), Diabetes mellitus (HCC), Hyperlipidemia, Hypertension, Parkinson disease (HCC), and Pneumonia.    Previous Testing:     HOLTER 11/22/17: SR with SB and ST. Rare PACs/PVCs.     Previous office/hospital visit:   None     Home Medications:      Prior to Admission medications    Medication Sig Start Date End Date Taking? Authorizing Provider   famotidine (PEPCID) 20 MG tablet Take 20 mg by mouth 2 times daily   Yes Historical Provider, MD   LORazepam (ATIVAN) 1 MG tablet Take 1 mg by mouth 3 times daily.   Yes Historical Provider, MD   sucralfate (CARAFATE) 1 GM tablet Take 1 g by mouth 4 times daily   Yes Historical Provider, MD   QUEtiapine (SEROQUEL) 100 MG tablet Take 100 mg by mouth nightly   Yes Historical Provider, MD   atorvastatin (LIPITOR) 40 MG tablet Take 1 tablet by mouth nightly 11/17/17  Yes Christopher M Hauger, DO   QUEtiapine (SEROQUEL) 25 MG tablet Take 2 tablets by mouth nightly as needed for Other (Sleep)  Patient taking differently: Take 25 mg by mouth Take 2 tablets at night. 11/17/17  Yes Christopher M Hauger, DO   aspirin 325 MG EC tablet Take 1 tablet by mouth daily  Patient taking differently: Take 81 mg by mouth daily  11/18/17  Yes Stephanie Coup Hauger, DO   celecoxib (CELEBREX) 100 MG capsule Take 1 capsule by mouth 2 times daily  Patient taking differently: Take 100 mg by mouth daily  11/17/17  Yes Christopher M Hauger, DO   lisinopril (PRINIVIL;ZESTRIL) 10 MG tablet Take 0.5 tablets by mouth daily Patient to continue to take her Lisinopril. 11/17/17  Yes Christopher M Hauger, DO   carvedilol (COREG) 3.125 MG tablet Take 1 tablet by mouth 2 times daily 11/17/17  Yes Christopher M Hauger, DO   albuterol (PROVENTIL) (2.5 MG/3ML) 0.083% nebulizer solution Take 3 mLs by nebulization  every 4 hours as needed for Wheezing or Shortness of Breath 11/17/17  Yes Stephanie Coup Hauger, DO   desvenlafaxine succinate (PRISTIQ) 100 MG TB24 extended release tablet Take 1 tablet by mouth daily 11/17/17  Yes Stephanie Coup Hauger, DO   folic acid (FOLVITE) 1 MG tablet Take 1 tablet by mouth daily 11/17/17  Yes Stephanie Coup Hauger, DO   metFORMIN (GLUCOPHAGE) 500 MG tablet Take 1 tablet by mouth 2 times daily (with meals) 11/17/17  Yes Stephanie Coup Hauger, DO   montelukast (SINGULAIR) 10 MG tablet Take 1 tablet by mouth nightly 11/17/17  Yes Christopher M Hauger, DO   rOPINIRole (REQUIP) 4 MG tablet Take 1 tablet by mouth 3 times daily 11/17/17  Yes Christopher M Hauger, DO   tiotropium (SPIRIVA HANDIHALER) 18 MCG inhalation capsule Inhale 1 capsule into the lungs daily 11/17/17  Yes Christopher M Hauger, DO   carbidopa-levodopa (SINEMET) 25-100 MG per tablet Take 2 tablets by mouth 4 times daily 8 am, 12 pm, 4pm and 8 pm 04/18/18 05/18/18  Foy Guadalajara, APRN - CNP   rOPINIRole (REQUIP) 4 MG tablet TAKE 1 TABLET BY MOUTH 3 TIMES DAILY 03/12/18   Foy Guadalajara, APRN - CNP   acetaminophen (TYLENOL) 325 MG tablet Take 2 tablets  by mouth every 4 hours as needed for Pain or Fever 11/17/17   Stephanie Couphristopher M Hauger, DO   esomeprazole Magnesium (NEXIUM) 40 MG PACK Take 1 packet by mouth daily 11/17/17 04/18/18  Stephanie Couphristopher M Hauger, DO   fluticasone Aleda Grana(FLONASE) 50 MCG/ACT nasal spray 1 spray by Nasal route daily 11/17/17   Stephanie Couphristopher M Hauger, DO   hydrOXYzine (VISTARIL) 25 MG capsule Take 1 capsule by mouth 3 times daily as needed for Itching 11/17/17   Stephanie Couphristopher M Hauger, DO   ipratropium-albuterol (DUONEB) 0.5-2.5 (3) MG/3ML SOLN nebulizer solution Inhale 3 mLs into the lungs every 4 hours as needed for Shortness of Breath 11/17/17   Stephanie Couphristopher M Hauger, DO   magnesium hydroxide (MILK OF MAGNESIA) 400 MG/5ML suspension Take 30 mLs by mouth daily as needed for Constipation 11/17/17   Stephanie Couphristopher M Hauger, DO    sennosides-docusate sodium (SENOKOT-S) 8.6-50 MG tablet Take 1 tablet by mouth daily 11/17/17   Stephanie Couphristopher M Hauger, DO   paliperidone palmitate (INVEGA SUSTENNA) 234 MG/1.5ML SUSP IM injection Inject 234 mg into the muscle every 21 days     Historical Provider, MD      Current Facility-Administered Medications: nitroGLYCERIN (NITROSTAT) SL tablet 0.4 mg, 0.4 mg, Sublingual, Q5 Min PRN  carbidopa-levodopa (SINEMET) 25-100 MG per tablet 2 tablet, 2 tablet, Oral, 4x Daily  albuterol (PROVENTIL) nebulizer solution 2.5 mg, 2.5 mg, Nebulization, Q4H PRN  aspirin EC tablet 81 mg, 81 mg, Oral, Daily  atorvastatin (LIPITOR) tablet 40 mg, 40 mg, Oral, Nightly  carvedilol (COREG) tablet 3.125 mg, 3.125 mg, Oral, BID  celecoxib (CELEBREX) capsule 100 mg, 100 mg, Oral, Daily  desvenlafaxine succinate (PRISTIQ) extended release tablet 100 mg, 100 mg, Oral, Daily  pantoprazole (PROTONIX) tablet 40 mg, 40 mg, Oral, Daily  folic acid (FOLVITE) tablet 1 mg, 1 mg, Oral, Daily  hydrOXYzine (ATARAX) tablet 25 mg, 25 mg, Oral, TID PRN  LORazepam (ATIVAN) tablet 1 mg, 1 mg, Oral, TID  QUEtiapine (SEROQUEL) tablet 50 mg, 50 mg, Oral, Nightly  rOPINIRole (REQUIP) tablet 4 mg, 4 mg, Oral, TID  sucralfate (CARAFATE) tablet 1 g, 1 g, Oral, 4x Daily  tiotropium (SPIRIVA RESPIMAT) 2.5 MCG/ACT inhaler 2 puff, 2 puff, Inhalation, Daily  montelukast (SINGULAIR) tablet 10 mg, 10 mg, Oral, Nightly  ipratropium-albuterol (DUONEB) nebulizer solution 3 mL, 3 mL, Inhalation, Q4H PRN  sodium chloride flush 0.9 % injection 10 mL, 10 mL, Intravenous, 2 times per day  sodium chloride flush 0.9 % injection 10 mL, 10 mL, Intravenous, PRN  acetaminophen (TYLENOL) tablet 650 mg, 650 mg, Oral, Q4H PRN  enoxaparin (LOVENOX) injection 40 mg, 40 mg, Subcutaneous, Daily  ondansetron (ZOFRAN) injection 4 mg, 4 mg, Intravenous, Q8H PRN    Labs:     CBC:   Recent Labs     06/19/18  1514   WBC 7.5   HGB 13.6   HCT 42.9   PLT 315     BMP:   Recent Labs      06/19/18  1514   NA 140   K 4.6   CO2 28   BUN 16   CREATININE 0.50   LABGLOM >60   GLUCOSE 98     CARDIAC ENZYMES:  Recent Labs     06/19/18  1514 06/19/18  1555   TROPHS 10 8     FASTING LIPID PANEL:  Lab Results   Component Value Date    HDL 56 05/08/2018    TRIG 88 05/08/2018     Robie RidgeEmily Mykala Mccready,  RN   [facility]   Pager: [phone]

## 2018-06-20 NOTE — Discharge Summary (Signed)
CDU Discharge Summary        Patient:  Shannon Allison  Date of Birth: 24-Jul-1960    MRN: [id number]   Acct: [id number]    Primary Care Physician: Anise Salvo, APRN - CNP    Admit date:  06/19/2018  2:53 PM  Discharge date: No discharge date for patient encounter. 06/20/2018    Discharge Diagnoses:     Acute mid sternal chest pain with associated radiation to right arm due to unknown etiology  Improved with IV Toradol, p.o. Tylenol p.o. nitroglycerin    Follow-up:  Call today/tomorrow for a follow up appointment with Anise Salvo, APRN - CNP , cardiology outpatient or return to the Emergency Room with worsening symptoms    Stressed to patient the importance of following up with primary care doctor for further workup/management of symptoms.  Pt verbalizes understanding and agrees with plan.    Discharge Medications:  Changes to medications          Elaini, Dini   Home Medication Instructions XNA:[id number]    Printed on:06/20/18 1202   Medication Information                      acetaminophen (TYLENOL) 325 MG tablet  Take 2 tablets by mouth every 4 hours as needed for Pain or Fever             albuterol (PROVENTIL) (2.5 MG/3ML) 0.083% nebulizer solution  Take 3 mLs by nebulization every 4 hours as needed for Wheezing or Shortness of Breath             aspirin 325 MG EC tablet  Take 1 tablet by mouth daily             atorvastatin (LIPITOR) 40 MG tablet  Take 1 tablet by mouth nightly             carbidopa-levodopa (SINEMET) 25-100 MG per tablet  Take 2 tablets by mouth 4 times daily 8 am, 12 pm, 4pm and 8 pm             carvedilol (COREG) 3.125 MG tablet  Take 1 tablet by mouth 2 times daily             celecoxib (CELEBREX) 100 MG capsule  Take 1 capsule by mouth 2 times daily             desvenlafaxine succinate (PRISTIQ) 100 MG TB24 extended release tablet  Take 1 tablet by mouth daily             esomeprazole Magnesium (NEXIUM) 40 MG PACK  Take 1 packet by mouth daily             famotidine (PEPCID)  20 MG tablet  Take 20 mg by mouth 2 times daily             fluticasone (FLONASE) 50 MCG/ACT nasal spray  1 spray by Nasal route daily             folic acid (FOLVITE) 1 MG tablet  Take 1 tablet by mouth daily             hydrOXYzine (VISTARIL) 25 MG capsule  Take 1 capsule by mouth 3 times daily as needed for Itching             ipratropium-albuterol (DUONEB) 0.5-2.5 (3) MG/3ML SOLN nebulizer solution  Inhale 3 mLs into the lungs every 4 hours as needed for Shortness of Breath  lisinopril (PRINIVIL;ZESTRIL) 10 MG tablet  Take 0.5 tablets by mouth daily Patient to continue to take her Lisinopril.             LORazepam (ATIVAN) 1 MG tablet  Take 1 mg by mouth 3 times daily.             magnesium hydroxide (MILK OF MAGNESIA) 400 MG/5ML suspension  Take 30 mLs by mouth daily as needed for Constipation             metFORMIN (GLUCOPHAGE) 500 MG tablet  Take 1 tablet by mouth 2 times daily (with meals)             montelukast (SINGULAIR) 10 MG tablet  Take 1 tablet by mouth nightly             paliperidone palmitate (INVEGA SUSTENNA) 234 MG/1.5ML SUSP IM injection  Inject 234 mg into the muscle every 21 days              QUEtiapine (SEROQUEL) 100 MG tablet  Take 100 mg by mouth nightly             QUEtiapine (SEROQUEL) 25 MG tablet  Take 2 tablets by mouth nightly as needed for Other (Sleep)             rOPINIRole (REQUIP) 4 MG tablet  Take 1 tablet by mouth 3 times daily             rOPINIRole (REQUIP) 4 MG tablet  TAKE 1 TABLET BY MOUTH 3 TIMES DAILY             sennosides-docusate sodium (SENOKOT-S) 8.6-50 MG tablet  Take 1 tablet by mouth daily             sucralfate (CARAFATE) 1 GM tablet  Take 1 g by mouth 4 times daily             tiotropium (SPIRIVA HANDIHALER) 18 MCG inhalation capsule  Inhale 1 capsule into the lungs daily                 Diet:  Diet NPO, After Midnight Exceptions are: Sips with Meds  Diet NPO, After Midnight Exceptions are: Sips with Meds , Advance as tolerated     Activity:  As  tolerated    Consultants: IP CONSULT TO CARDIOLOGY    Procedures: Stress test    Diagnostic Test:   Results for orders placed or performed during the hospital encounter of 06/19/18   CBC WITH AUTO DIFFERENTIAL   Result Value Ref Range    WBC 7.5 3.5 - 11.3 k/uL    RBC 4.75 3.95 - 5.11 m/uL    Hemoglobin 13.6 11.9 - 15.1 g/dL    Hematocrit 35.7 01.7 - 47.1 %    MCV 90.3 82.6 - 102.9 fL    MCH 28.6 25.2 - 33.5 pg    MCHC 31.7 28.4 - 34.8 g/dL    RDW 79.3 90.3 - 00.9 %    Platelets 315 138 - 453 k/uL    MPV 9.8 8.1 - 13.5 fL    NRBC Automated 0.0 0.0 per 100 WBC    Differential Type NOT REPORTED     Seg Neutrophils 66 (H) 36 - 65 %    Lymphocytes 24 24 - 43 %    Monocytes 9 3 - 12 %    Eosinophils % 1 1 - 4 %    Basophils 0 0 - 2 %    Immature Granulocytes 0  0 %    Segs Absolute 4.91 1.50 - 8.10 k/uL    Absolute Lymph # 1.77 1.10 - 3.70 k/uL    Absolute Mono # 0.68 0.10 - 1.20 k/uL    Absolute Eos # 0.08 0.00 - 0.44 k/uL    Basophils Absolute <0.03 0.00 - 0.20 k/uL    Absolute Immature Granulocyte <0.03 0.00 - 0.30 k/uL    WBC Morphology NOT REPORTED     RBC Morphology NOT REPORTED     Platelet Estimate NOT REPORTED    BASIC METABOLIC PANEL   Result Value Ref Range    Glucose 98 70 - 99 mg/dL    BUN 16 6 - 20 mg/dL    CREATININE 1.61 0.96 - 0.90 mg/dL    Bun/Cre Ratio NOT REPORTED 9 - 20    Calcium 9.4 8.6 - 10.4 mg/dL    Sodium 045 409 - 811 mmol/L    Potassium 4.6 3.7 - 5.3 mmol/L    Chloride 100 98 - 107 mmol/L    CO2 28 20 - 31 mmol/L    Anion Gap 12 9 - 17 mmol/L    GFR Non-African American >60 >60 mL/min    GFR African American >60 >60 mL/min    GFR Comment          GFR Staging NOT REPORTED    Troponin   Result Value Ref Range    Troponin, High Sensitivity 10 0 - 14 ng/L    Troponin T NOT REPORTED <0.03 ng/mL    Troponin Interp NOT REPORTED    Troponin   Result Value Ref Range    Troponin, High Sensitivity 8 0 - 14 ng/L    Troponin T NOT REPORTED <0.03 ng/mL    Troponin Interp NOT REPORTED    D-Dimer,  Quantitative   Result Value Ref Range    D-Dimer, Quant 0.97 mg/L FEU   EKG 12 Lead   Result Value Ref Range    Ventricular Rate 76 BPM    Atrial Rate 76 BPM    P-R Interval 150 ms    QRS Duration 96 ms    Q-T Interval 424 ms    QTc Calculation (Bazett) 477 ms    P Axis 82 degrees    R Axis -43 degrees    T Axis 74 degrees     Xr Chest Standard (2 Vw)    Result Date: 06/19/2018  EXAMINATION: TWO XRAY VIEWS OF THE CHEST 06/19/2018 3:17 pm COMPARISON: Chest radiograph performed 11/14/2017. HISTORY: ORDERING SYSTEM PROVIDED HISTORY: right-sided chest pain TECHNOLOGIST PROVIDED HISTORY: right-sided chest pain Reason for Exam: right sided chest pain Acuity: Unknown Type of Exam: Unknown FINDINGS: There is no acute consolidation or effusion.  There is no pneumothorax.  The mediastinal structures are unremarkable.  The upper abdomen unremarkable. The extrathoracic soft tissues are unremarkable.     No acute cardiopulmonary process.     Ct Chest Pulmonary Embolism W Contrast    Result Date: 06/19/2018  EXAMINATION: CTA OF THE CHEST 06/19/2018 4:03 pm TECHNIQUE: CTA of the chest was performed after the administration of intravenous contrast.  Multiplanar reformatted images are provided for review.  MIP images are provided for review. Dose modulation, iterative reconstruction, and/or weight based adjustment of the mA/kV was utilized to reduce the radiation dose to as low as reasonably achievable. COMPARISON: None. HISTORY: ORDERING SYSTEM PROVIDED HISTORY: chest pain, worse with inspiration, elevated d dimer TECHNOLOGIST PROVIDED HISTORY: chest pain, worse with inspiration, elevated d dimer Reason for Exam:  elevated d dimer Acuity: Unknown Type of Exam: Unknown FINDINGS: Pulmonary Arteries: Pulmonary arteries are adequately opacified for evaluation.  No evidence of intraluminal filling defect to suggest pulmonary embolism.  Main pulmonary artery is normal in caliber. Mediastinum: No evidence of mediastinal lymphadenopathy.  The  heart and pericardium demonstrate no acute abnormality.  There is no acute abnormality of the thoracic aorta. Lungs/pleura: There is a small nodular infiltrate in the right lower lobe. There is no effusion.  There is no pneumothorax.  There is no dominant pulmonary nodule or pulmonary mass.  The tracheobronchial tree is patent. Upper Abdomen: Limited images of the upper abdomen are unremarkable. Soft Tissues/Bones: No acute bone or soft tissue abnormality.     No acute or chronic pulmonary embolism. Small nodular infiltrate in the right lower lobe that may represent an early infectious etiology and follow-up chest CT is recommended in 3 months to assess resolution.           Physical Exam:    General appearance - NAD, AOx 3   Lungs -CTAB, no R/R/R  Heart - RRR, no M/R/G  Abdomen - Soft, NT/ND  Neurological:  MAEx4, No focal motor deficit, sensory loss  Extremities - Cap refil <2 sec in all ext., no edema  Skin -warm, dry      Hospital Course:  Clinical course has improved, labs and imaging reviewed.     Shannon Allison originally presented to the hospital on 06/19/2018  2:53 PM. with acute midsternal chest pain with radiation to right arm.  At that time it was determined that She required further observation and cardiology evaluation and stress test. She was admitted and labs and imaging were followed daily.  Imaging results as above.  She is medically stable to be discharged.       Disposition: Home    Patient stated that they will not drive themselves home from the hospital if they have gotten pain killers/ narcotics earlier that day and that they will arrange for transportation on their own or work with the case manager for a ride. Patient counseled NOT to drive while under the influence of narcotics/ pain killers.     Condition: Good    Patient stable and ready for discharge home. I have discussed plan of care with patient and they are in understanding. They were instructed to read discharge paperwork. All of  their questions and concerns were addressed.     Time Spent: 0 day      --  Driscilla Grammes, DO  Emergency Medicine Resident Physician    This dictation was generated by voice recognition computer software.  Although all attempts are made to edit the dictation for accuracy, there may be errors in the transcription that are not intended.

## 2018-06-20 NOTE — Consults (Addendum)
Attestation signed by      Attending Physician Statement:    I have discussed the care of  Shannon Allison , including pertinent history and exam findings, with the Cardiology fellow/resident.     I have seen and examined the patient and the key elements of all parts of the encounter have been performed by me. I agree with the assessment, plan and orders as documented by the fellow/resident, after I modified exam findings and plan of treatments, and the final version is my approved version of the assessment.     Additional Comments: The patient was seen and examined, agree with below, presented with chest pain. Multiple risk factors for CAD. Will plan for Cardiolite stress test.             [facility]   Consultation Note               Today's Date: 06/20/2018  Patient Name: Shannon Allison  Date of admission: 06/19/2018  2:53 PM  Patient's age: 58 y.o., [birth year]  Admission Dx: Chest pain [R07.9]    Reason for Consult:  Chest pain    Requesting Physician: Francena Hanlyavid J Ledrick, MD    CHIEF COMPLAINT:    Chief Complaint   Patient presents with   ??? Chest Pain     pt with c/o midsternal chest pain x several days. began while sitting at home. pt was at her pcp office today for her routine check up and told them she was having chest pain. ems was called, pt transferred [facility]. pt given 324 mg asprin pta       History Obtained From:  patient, electronic medical record    HISTORY OF PRESENT ILLNESS:      The patient is a 58 y.o. female who was admitted to the hospital for Chest pain [R07.9]. She presented to the ED via EMS with complaints of chest pain for the past 2 to 3 days that she described as substernal, radiating to the right arm, worsened with inspiration, and not improved with anything.    In the ED, she had a CTA chest for PE that showed:  No acute or chronic pulmonary embolism.   ??   Small nodular infiltrate in the right lower lobe that may represent an early   infectious etiology and  follow-up chest CT is recommended in 3 months to   assess resolution.   ??       Her troponins were 10 -> 8, but her EKG showed new changes compared to EKG from August 2019, including inferior infarct and incomplete RBBB.    Cardiology was consulted for further recommendations.      Past Medical History:   has a past medical history of Asthma, COPD (chronic obstructive pulmonary disease) (HCC), Diabetes mellitus (HCC), Hyperlipidemia, Hypertension, Parkinson disease (HCC), and Pneumonia.      Past Surgical History:   has a past surgical history that includes Nasal septum surgery (Bilateral); pr esophagogastroduodenoscopy transoral diagnostic (N/A, 01/04/2016); pr colon ca scrn not hi rsk ind (01/04/2016); and pr colon ca scrn not hi rsk ind (N/A, 02/10/2016).       Home Medications:    Prior to Admission medications    Medication Sig Start Date End Date Taking? Authorizing Provider   famotidine (PEPCID) 20 MG tablet Take 20 mg by mouth 2 times daily   Yes Historical Provider, MD   LORazepam (ATIVAN) 1 MG tablet Take 1 mg by mouth 3 times  daily.   Yes Historical Provider, MD   sucralfate (CARAFATE) 1 GM tablet Take 1 g by mouth 4 times daily   Yes Historical Provider, MD   QUEtiapine (SEROQUEL) 100 MG tablet Take 100 mg by mouth nightly   Yes Historical Provider, MD   atorvastatin (LIPITOR) 40 MG tablet Take 1 tablet by mouth nightly 11/17/17  Yes Christopher M Hauger, DO   QUEtiapine (SEROQUEL) 25 MG tablet Take 2 tablets by mouth nightly as needed for Other (Sleep)  Patient taking differently: Take 25 mg by mouth Take 2 tablets at night. 11/17/17  Yes Christopher M Hauger, DO   aspirin 325 MG EC tablet Take 1 tablet by mouth daily  Patient taking differently: Take 81 mg by mouth daily  11/18/17  Yes Stephanie Coup Hauger, DO   celecoxib (CELEBREX) 100 MG capsule Take 1 capsule by mouth 2 times daily  Patient taking differently: Take 100 mg by mouth daily  11/17/17  Yes Christopher M Hauger, DO   lisinopril  (PRINIVIL;ZESTRIL) 10 MG tablet Take 0.5 tablets by mouth daily Patient to continue to take her Lisinopril. 11/17/17  Yes Christopher M Hauger, DO   carvedilol (COREG) 3.125 MG tablet Take 1 tablet by mouth 2 times daily 11/17/17  Yes Christopher M Hauger, DO   albuterol (PROVENTIL) (2.5 MG/3ML) 0.083% nebulizer solution Take 3 mLs by nebulization every 4 hours as needed for Wheezing or Shortness of Breath 11/17/17  Yes Stephanie Coup Hauger, DO   desvenlafaxine succinate (PRISTIQ) 100 MG TB24 extended release tablet Take 1 tablet by mouth daily 11/17/17  Yes Stephanie Coup Hauger, DO   folic acid (FOLVITE) 1 MG tablet Take 1 tablet by mouth daily 11/17/17  Yes Stephanie Coup Hauger, DO   metFORMIN (GLUCOPHAGE) 500 MG tablet Take 1 tablet by mouth 2 times daily (with meals) 11/17/17  Yes Stephanie Coup Hauger, DO   montelukast (SINGULAIR) 10 MG tablet Take 1 tablet by mouth nightly 11/17/17  Yes Christopher M Hauger, DO   rOPINIRole (REQUIP) 4 MG tablet Take 1 tablet by mouth 3 times daily 11/17/17  Yes Christopher M Hauger, DO   tiotropium (SPIRIVA HANDIHALER) 18 MCG inhalation capsule Inhale 1 capsule into the lungs daily 11/17/17  Yes Christopher M Hauger, DO   carbidopa-levodopa (SINEMET) 25-100 MG per tablet Take 2 tablets by mouth 4 times daily 8 am, 12 pm, 4pm and 8 pm 04/18/18 05/18/18  Foy Guadalajara, APRN - CNP   rOPINIRole (REQUIP) 4 MG tablet TAKE 1 TABLET BY MOUTH 3 TIMES DAILY 03/12/18   Foy [place], APRN - CNP   acetaminophen (TYLENOL) 325 MG tablet Take 2 tablets by mouth every 4 hours as needed for Pain or Fever 11/17/17   Stephanie Coup Hauger, DO   esomeprazole Magnesium (NEXIUM) 40 MG PACK Take 1 packet by mouth daily 11/17/17 04/18/18  Stephanie Coup Hauger, DO   fluticasone Aleda Grana) 50 MCG/ACT nasal spray 1 spray by Nasal route daily 11/17/17   Stephanie Coup Hauger, DO   hydrOXYzine (VISTARIL) 25 MG capsule Take 1 capsule by mouth 3 times daily as needed for Itching 11/17/17   Stephanie Coup Hauger, DO    ipratropium-albuterol (DUONEB) 0.5-2.5 (3) MG/3ML SOLN nebulizer solution Inhale 3 mLs into the lungs every 4 hours as needed for Shortness of Breath 11/17/17   Stephanie Coup Hauger, DO   magnesium hydroxide (MILK OF MAGNESIA) 400 MG/5ML suspension Take 30 mLs by mouth daily as needed for Constipation 11/17/17   Stephanie Coup Hauger, DO  sennosides-docusate sodium (SENOKOT-S) 8.6-50 MG tablet Take 1 tablet by mouth daily 11/17/17   Stephanie Coup Hauger, DO   paliperidone palmitate (INVEGA SUSTENNA) 234 MG/1.5ML SUSP IM injection Inject 234 mg into the muscle every 21 days     Historical Provider, MD      Current Facility-Administered Medications: nitroGLYCERIN (NITROSTAT) SL tablet 0.4 mg, 0.4 mg, Sublingual, Q5 Min PRN  carbidopa-levodopa (SINEMET) 25-100 MG per tablet 2 tablet, 2 tablet, Oral, 4x Daily  albuterol (PROVENTIL) nebulizer solution 2.5 mg, 2.5 mg, Nebulization, Q4H PRN  aspirin EC tablet 81 mg, 81 mg, Oral, Daily  atorvastatin (LIPITOR) tablet 40 mg, 40 mg, Oral, Nightly  carvedilol (COREG) tablet 3.125 mg, 3.125 mg, Oral, BID  celecoxib (CELEBREX) capsule 100 mg, 100 mg, Oral, Daily  desvenlafaxine succinate (PRISTIQ) extended release tablet 100 mg, 100 mg, Oral, Daily  pantoprazole (PROTONIX) tablet 40 mg, 40 mg, Oral, Daily  folic acid (FOLVITE) tablet 1 mg, 1 mg, Oral, Daily  hydrOXYzine (ATARAX) tablet 25 mg, 25 mg, Oral, TID PRN  LORazepam (ATIVAN) tablet 1 mg, 1 mg, Oral, TID  QUEtiapine (SEROQUEL) tablet 50 mg, 50 mg, Oral, Nightly  rOPINIRole (REQUIP) tablet 4 mg, 4 mg, Oral, TID  sucralfate (CARAFATE) tablet 1 g, 1 g, Oral, 4x Daily  tiotropium (SPIRIVA RESPIMAT) 2.5 MCG/ACT inhaler 2 puff, 2 puff, Inhalation, Daily  montelukast (SINGULAIR) tablet 10 mg, 10 mg, Oral, Nightly  ipratropium-albuterol (DUONEB) nebulizer solution 3 mL, 3 mL, Inhalation, Q4H PRN  sodium chloride flush 0.9 % injection 10 mL, 10 mL, Intravenous, 2 times per day  sodium chloride flush 0.9 % injection 10 mL, 10 mL,  Intravenous, PRN  acetaminophen (TYLENOL) tablet 650 mg, 650 mg, Oral, Q4H PRN  enoxaparin (LOVENOX) injection 40 mg, 40 mg, Subcutaneous, Daily  ondansetron (ZOFRAN) injection 4 mg, 4 mg, Intravenous, Q8H PRN      Allergies:  Dust mite extract and Pollen extract      Social History:   reports that she has been smoking. She has been smoking about 0.25 packs per day. She has never used smokeless tobacco. She reports that she does not drink alcohol or use drugs.       Family History: family history includes High Blood Pressure in her father and mother. No h/o sudden cardiac death.No for premature CAD      REVIEW OF SYSTEMS:    ?? Constitutional: there has been no unanticipated weight loss. There's been No change in energy level, No change in activity level.     ?? Eyes: No visual changes or diplopia. No scleral icterus.  ?? ENT: No Headaches  ?? Cardiovascular: As noted  ?? Respiratory: No previous pulmonary problems, No cough  ?? Gastrointestinal: No abdominal pain.  No change in bowel or bladder habits.  ?? Genitourinary: No dysuria, trouble voiding, or hematuria.  ?? Musculoskeletal:  No gait disturbance, No weakness or joint complaints.  ?? Integumentary: No rash or pruritis.  ?? Neurological: No headache, diplopia, change in muscle strength, numbness or tingling. No change in gait, balance, coordination, mood, affect, memory, mentation, behavior.  ?? Psychiatric: No anxiety, or depression.  ?? Endocrine: No temperature intolerance. No excessive thirst, fluid intake, or urination. No tremor.  ?? Hematologic/Lymphatic: No abnormal bruising or bleeding, blood clots or swollen lymph nodes.  ?? Allergic/Immunologic: No nasal congestion or hives.      PHYSICAL EXAM:      BP 133/77    Pulse 74    Temp 96.6 ??F (35.9 ??C)  Resp 20    Ht 5\' 9"  (1.753 m)    Wt 194 lb (88 kg)    SpO2 94%    BMI 28.65 kg/m??    Constitutional and General Appearance: Alert, cooperative, no distress and appears stated age  HEENT: PERRLA, no cervical  lymphadenopathy. No masses palpable.   Respiratory:  ?? Resp Auscultation: Fair respiratory effort. No increased work of breathing.  ?? Clear to auscultation bilaterally  Cardiovascular:  ?? Normal S1 and S2.   ?? RRR  ?? Jugular venous pulsation Normal  ?? Peripheral pulses are symmetrical and full   Abdomen:   ?? Soft  ?? No tenderness  ?? Bowel sounds present  Extremities:  ?? No cyanosis or clubbing  ?? No lower extremity edema  ?? Skin: Warm and dry  Neurologic:  ?? Alert and oriented.  ?? Moves all extremities well  Psychiatric:  ?? No abnormalities of mood, affect, memory, mentation, or behavior are noted      DATA:    Diagnostics:      EKG:   Results for orders placed or performed during the hospital encounter of 06/19/18   EKG 12 Lead   Result Value Ref Range    Ventricular Rate 76 BPM    Atrial Rate 76 BPM    P-R Interval 150 ms    QRS Duration 96 ms    Q-T Interval 424 ms    QTc Calculation (Bazett) 477 ms    P Axis 82 degrees    R Axis -43 degrees    T Axis 74 degrees    Narrative    Normal sinus rhythm  Left axis deviation  Incomplete right bundle branch block  Inferior infarct , age undetermined  Abnormal ECG  When compared with ECG of 17-Nov-2017 09:19,  Incomplete right bundle branch block is now Present       ECHO:   Results for orders placed or performed during the hospital encounter of 11/14/17   Echocardiogram complete 2D with doppler with color   Result Value Ref Range    Left Ventricular Ejection Fraction 55     LVEF MODALITY ECHO     Narrative    Transthoracic Echocardiography Report (TTE)     Patient Name Baccam        Date of Study               11/15/2017                MOISES KATA      Date of      1960/07/27  Gender                      Female   Birth      Age          49 year(s)  Race                        Caucasian      Room Number  2004        Height:                     69 inch, 175.26 cm      Corporate ID [id number]    Weight:                     200 pounds, 90.7 kg   #      Patient Acct [id number]  BSA:          2.07 m^2      BMI:      29.54   #                                                              kg/m^2      MR #         [id number]     Sonographer                 Galbraith,Tiffany      Accession #  [id number]   Interpreting Physician      Hashmi,Raza      Fellow                   Referring Nurse                            Practitioner      Interpreting             Referring Physician         Chad Cordial MD   Fellow     Type of Study      TTE procedure:2D Echocardiogram, M-Mode, Doppler, Color Doppler.     Procedure Date  Date: 11/15/2017 Start: 12:29 PM    Study Location: [facility]  Technical Quality: Fair visualization    History / Tech. Comments:  Procedure explained to patient.  Chest pain    Patient Status: Inpatient    Height: 69 inches Weight: 200 pounds BSA: 2.07 m^2 BMI: 29.54 kg/m^2    HR: 60 bpm    CONCLUSIONS    Summary  Left ventricle is normal in size. Global left ventricular systolic function  is normal. Estimated ejection fraction is 55 % .  Calculated EF via heart model is 56 %.  Normal left ventricular wall thickness.  No obvious wall motion abnormality seen.  No significant valvular abnormalities .    Signature  ----------------------------------------------------------------------------   Electronically signed by Galbraith,Tiffany(Sonographer) on 11/15/2017   02:00 PM  ----------------------------------------------------------------------------    ----------------------------------------------------------------------------   Electronically signed by Hashmi,Raza(Interpreting physician) on 11/16/2017   10:27 AM  ----------------------------------------------------------------------------  FINDINGS  Left Atrium  Left atrium is normal in size.  Left Ventricle  Left ventricle is normal in size. Global left ventricular systolic function  is normal. Estimated ejection fraction is 55 % .  Calculated EF via heart model is 56 %.  Normal left ventricular wall thickness.  No  obvious wall motion abnormality seen.  Right Atrium  Right atrium is normal size .  Right Ventricle  Normal right ventricle size and function.  Mitral Valve  Normal mitral valve structure.  No significant mitral regurgitation.  No mitral stenosis.  Aortic Valve  Aortic valve structure and function normal.  Aortic valve is trileaflet.  No significant aortic insufficiency.  No aortic stenosis.  Tricuspid Valve  Normal tricuspid valve leaflets.  Trivial tricuspid regurgitation.  No tricuspid stenosis.  Estimated right ventricular systolic pressure is 19 mmHg.  Pulmonic Valve  Pulmonic valve is normal in structure and function.  No significant pulmonic insufficiency.  No evidence of pulmonic stenosis.  Pericardial Effusion  No significant pericardial effusion is seen.  Miscellaneous  Normal aortic root dimension. The ascending aorta is mildly dilated.  E/E'' = 9.9 .  IVC not visualized, unable to assess size and respiratory collapse.  Subcostal views could not be well visualized.    M-mode / 2D Measurements & Calculations:      LVIDd:4 cm(3.7 - 5.6 cm)         Diastolic Volume:144 ml   LVIDs:2.2 cm(2.2 - 4.0 cm)       Systolic Volume:64 ml   IVSd:0.8 cm(0.6 - 1.1 cm)        Aortic Root:3.3 cm(2.0 - 3.7 cm)   LVPWd:0.9 cm(0.6 - 1.1 cm)       LA Dimension: 3.3 cm(1.9 - 4.0 cm)   Fractional Shortening:45 %       LA volume/Index: 50.03 ml /62m^2   Calculated LVEF (%): 55.56 %     LVOT:1.9 cm                                    RVDd:3.8 cm      Mitral:                                 Aortic      Valve Area (P1/2-Time): 2.47 cm^2       Peak Velocity: 1.25 m/s   Peak E-Wave: 0.99 m/s                   Mean Velocity: 0.90 m/s   Peak A-Wave: 0.85 m/s                   Peak Gradient: 6.25 mmHg   E/A Ratio: 1.16                         Mean Gradient: 4 mmHg   Peak Gradient: 3.88 mmHg   Mean Gradient: 2 mmHg   Deceleration Time: 239 msec             Area (continuity): 2.69 cm^2   P1/2t: 89 msec                          AV VTI:  27.5 cm      Area (continuity): 1.96 cm^2   Mean Velocity: 0.60 m/s      Tricuspid:                              Pulmonic:      Estimated RVSP: 19 mmHg                 Peak Velocity: 0.88 m/s   Peak TR Velocity: 1.87 m/s              Peak Gradient: 3.11 mmHg   Peak TR Gradient: 13.9876 mmHg   Estimated RA Pressure: 5 mmHg                                              Estimated PASP: 18.99 mmHg     Diastology / Tissue Doppler  Septal Wall E' velocity:0.09 m/s  Septal Wall E/E':10.8  Lateral Wall E' velocity:0.11 m/s  Lateral Wall E/E':9  HOLTER 11/22/17: SR with SB and ST. Rare PACs/PVCs.     Labs:     CBC:   Recent Labs     06/19/18  1514   WBC 7.5   HGB 13.6   HCT 42.9   PLT 315     BMP:   Recent Labs     06/19/18  1514   NA 140   K 4.6   CO2 28   BUN 16   CREATININE 0.50   LABGLOM >60   GLUCOSE 98     BNP: No results for input(s): BNP in the last 72 hours.  PT/INR: No results for input(s): PROTIME, INR in the last 72 hours.  APTT:No results for input(s): APTT in the last 72 hours.  CARDIAC ENZYMES:  Recent Labs     06/19/18  1514 06/19/18  1555   TROPHS 10 8     No results for input(s): CKTOTAL, CKMB, CKMBINDEX, TROPONINI in the last 72 hours.    Invalid input(s):  TROPONINT  Recent Labs     06/19/18  1514 06/19/18  1555   TROPONINT NOT REPORTED NOT REPORTED     FASTING LIPID PANEL:  Lab Results   Component Value Date    HDL 56 05/08/2018    TRIG 88 05/08/2018     LIVER PROFILE:No results for input(s): AST, ALT, LABALBU in the last 72 hours.      IMPRESSION:    1. Chest pain, mixed features (atypical and typical)  2. Abnormal EKG  3. HTN  4. Hyperlipidemia  5. DM-2  6. Parkinson's disease    Patient Active Problem List   Diagnosis   ??? Essential hypertension   ??? Schizophrenia (HCC)   ??? Tremor   ??? Parkinson's disease (HCC)   ??? Asthma exacerbation, mild   ??? Type 2 diabetes mellitus, without long-term current use of insulin (HCC)   ??? Gastroesophageal reflux disease without esophagitis   ??? Atypical chest pain   ???  Uncomplicated asthma   ??? Prolonged QT interval   ??? Wide-complex tachycardia (HCC)   ??? Behavior related to cognitive impairment   ??? Chest pain, musculoskeletal   ??? Chest pain       RECOMMENDATIONS:  1. Agree with ASA, Lipitor, Coreg  2. Given abnormal EKG, check Lexiscan Cardiolite stress test  3. If stress test unremarkable, ok to discharge patient home and follow-up as outpatient.      Discussed with patient and nurse.    Thank you for allowing [place] to participate in Carolynn Comment Opie's care.      Electronically signed on 06/20/18 at 9:12 AM by:    Mariana Single, MD   Fellow, Cardiovascular Diseases  [facility]. [facility]

## 2018-06-20 NOTE — Progress Notes (Signed)
[facility]  CDU / OBSERVATION eNCOUnter  Attending NOte       I performed a history and physical examination of the patient and discussed management with the resident. I reviewed the resident???s note and agree with the documented findings and plan of care. Any areas of disagreement are noted on the chart. I was personally present for the key portions of any procedures. I have documented in the chart those procedures where I was not present during the key portions. I have reviewed the nurses notes. I agree with the chief complaint, past medical history, past surgical history, allergies, medications, social and family history as documented unless otherwise noted below.    The Family history, social history, and ROS are effectively unchanged since admission unless noted elsewhere in the chart.    Symptomatically improved.  Patient has been evaluated by cardiology and stress testing recommended.  Patient has primarily complaints of being hungry this morning.  Patient denies other difficulty.  Patient with significant Parkinson's.  Does have caregiver here.  Awaiting results of stress testing for disposition.    Francena Hanly MD  Attending Emergency  Physician

## 2018-06-20 NOTE — H&P (Signed)
[place] [facility]  CDU / OBSERVATION ENCOUNTER  RESIDENT NOTE     Pt Name: Shannon Allison  MRN: [id number]  Birthdate Oct 18, 1960  Date of evaluation: 06/20/18  Patient's PCP is :  Anise Salvo, APRN - CNP    CHIEF COMPLAINT       Chief Complaint   Patient presents with   ??? Chest Pain     pt with c/o midsternal chest pain x several days. began while sitting at home. pt was at her pcp office today for her routine check up and told them she was having chest pain. ems was called, [facility] pt given 324 mg asprin pta         HISTORY OF PRESENT ILLNESS    Shannon Allison is a 58 y.o. female who presents mid sternal chest pain with associated radiation to right arm ongoing for last 3 days.  Denies any associated shortness of breath, nausea, vomiting, diaphoresis.  Worse with inspiration.  Patient also admits to a dry cough.  History of hyperlipidemia, hypertension, diabetes, proximal disease, COPD and daily tobacco use.  Patient went to her primary care provider for routine checkup: She is having chest pain EMS was called transferred to [facility] aspirin was given in route.    Location/Symptom: Mid sternal chest pain  Timing/Onset: 3 days  Provocation: Worse with inspiration  Quality: Pain  Radiation: Right arm  Severity: Mild  Timing/Duration: 3 days  Modifying Factors: Worse with inspiration    REVIEW OF SYSTEMS       General ROS - No fevers, No malaise   Ophthalmic ROS - No discharge, No changes in vision  ENT ROS -  No sore throat, No rhinorrhea,   Respiratory ROS - no shortness of breath, positive cough, no  wheezing  Cardiovascular ROS -positive chest pain, no dyspnea on exertion  Gastrointestinal ROS - No abdominal pain, no nausea or vomiting, no change in bowel habits, no black or bloody stools  Genito-Urinary ROS - No dysuria, trouble voiding, or hematuria  Musculoskeletal ROS - No myalgias, No arthalgias  Neurological ROS - No headache, no dizziness/lightheadedness, No focal  weakness, no loss of sensation  Dermatological ROS - No lesions, No rash     (PQRS) Advance directives on face sheet per hospital policy. No change unless specifically mentioned in chart    PAST MEDICAL HISTORY    has a past medical history of Asthma, COPD (chronic obstructive pulmonary disease) (HCC), Diabetes mellitus (HCC), Hyperlipidemia, Hypertension, Parkinson disease (HCC), and Pneumonia.    I have reviewed the past medical history with the patient and it is not pertinent to this complaint.      SURGICAL HISTORY      has a past surgical history that includes Nasal septum surgery (Bilateral); pr esophagogastroduodenoscopy transoral diagnostic (N/A, 01/04/2016); pr colon ca scrn not hi rsk ind (01/04/2016); and pr colon ca scrn not hi rsk ind (N/A, 02/10/2016).  I have reviewed and agree with Surgical History entered and it is not pertinent to this complaint.     CURRENT MEDICATIONS     nitroGLYCERIN (NITROSTAT) SL tablet 0.4 mg, Q5 Min PRN  carbidopa-levodopa (SINEMET) 25-100 MG per tablet 2 tablet, 4x Daily  albuterol (PROVENTIL) nebulizer solution 2.5 mg, Q4H PRN  aspirin EC tablet 81 mg, Daily  atorvastatin (LIPITOR) tablet 40 mg, Nightly  carvedilol (COREG) tablet 3.125 mg, BID  celecoxib (CELEBREX) capsule 100 mg, Daily  desvenlafaxine succinate (PRISTIQ) extended  release tablet 100 mg, Daily  pantoprazole (PROTONIX) tablet 40 mg, Daily  folic acid (FOLVITE) tablet 1 mg, Daily  hydrOXYzine (ATARAX) tablet 25 mg, TID PRN  LORazepam (ATIVAN) tablet 1 mg, TID  QUEtiapine (SEROQUEL) tablet 50 mg, Nightly  rOPINIRole (REQUIP) tablet 4 mg, TID  sucralfate (CARAFATE) tablet 1 g, 4x Daily  tiotropium (SPIRIVA RESPIMAT) 2.5 MCG/ACT inhaler 2 puff, Daily  montelukast (SINGULAIR) tablet 10 mg, Nightly  ipratropium-albuterol (DUONEB) nebulizer solution 3 mL, Q4H PRN  sodium chloride flush 0.9 % injection 10 mL, 2 times per day  sodium chloride flush 0.9 % injection 10 mL, PRN  acetaminophen (TYLENOL) tablet 650 mg, Q4H  PRN  enoxaparin (LOVENOX) injection 40 mg, Daily  ondansetron (ZOFRAN) injection 4 mg, Q8H PRN        All medication charted and reviewed.    ALLERGIES     is allergic to dust mite extract and pollen extract.      FAMILY HISTORY     She indicated that the status of her mother is unknown. She indicated that the status of her father is unknown.     family history includes High Blood Pressure in her father and mother.  The patient denies any pertinent family history.  I have reviewed and agree with the family history entered.  I have reviewed the Family History and it is not significant to the case    SOCIAL HISTORY      reports that she has been smoking. She has been smoking about 0.25 packs per day. She has never used smokeless tobacco. She reports that she does not drink alcohol or use drugs.  I have reviewed and agree with all Social.  There are concerns for substance abuse/use.    PHYSICAL EXAM     INITIAL VITALS:  height is  (1.753 m) and weight is 194 lb (88 kg). Her oral temperature is 97.2 ??F (36.2 ??C). Her blood pressure is 120/84 and her pulse is 75. Her respiration is 20 and oxygen saturation is 90%.      CONSTITUTIONAL: AOx4, no apparent distress, appears stated age    HEAD: normocephalic, atraumatic   EYES: PERRLA, EOMI    ENT: moist mucous membranes, uvula midline   NECK: supple, symmetric   BACK: symmetric   LUNGS: clear to auscultation bilaterally   CARDIOVASCULAR: regular rate and rhythm, no murmurs, rubs or gallops   ABDOMEN: soft, non-tender, non-distended with normal active bowel sounds   NEUROLOGIC:  MAEx4, no focal sensory or motor deficits   MUSCULOSKELETAL: no clubbing, cyanosis or edema   SKIN: no rash or wounds       DIFFERENTIAL DIAGNOSIS/MDM:     Chest Pain:  DDX: Emergent: ACS/NSTEMI/STEMI/angina, arrhythmia, trauma, aortic dissection,  PE, PNA, pneumothroax, esophageal rupture, tamponade, Cocaine use  Nonemergent: pneumonia, pericarditis, GERD, MSK, Endocarditis, anxiety  Evaluated  for: diaphoresis, present chest pain, tachypnea,  heart sounds, JVD, tender chest wall, wheezing        DIAGNOSTIC RESULTS     EKG: All EKG's are interpreted by the Observation Physician who either signs or Co-signs this chart in the absence of a cardiologist.    EKG Interpretation    Interpreted by observation physician    Rhythm: normal sinus   Rate: normal  Axis: normal  Ectopy: none  Conduction: normal  ST Segments: no acute change  T Waves: no acute change  Q Waves: none    Clinical Impression: no acute changes and non-specific EKG    Chrissie Noa  Tomasa Hose, DO        RADIOLOGY:   I directly visualized the following  images and reviewed the radiologist interpretations:    Xr Chest Standard (2 Vw)    Result Date: 06/19/2018  EXAMINATION: TWO XRAY VIEWS OF THE CHEST 06/19/2018 3:17 pm COMPARISON: Chest radiograph performed 11/14/2017. HISTORY: ORDERING SYSTEM PROVIDED HISTORY: right-sided chest pain TECHNOLOGIST PROVIDED HISTORY: right-sided chest pain Reason for Exam: right sided chest pain Acuity: Unknown Type of Exam: Unknown FINDINGS: There is no acute consolidation or effusion.  There is no pneumothorax.  The mediastinal structures are unremarkable.  The upper abdomen unremarkable. The extrathoracic soft tissues are unremarkable.     No acute cardiopulmonary process.     Ct Chest Pulmonary Embolism W Contrast    Result Date: 06/19/2018  EXAMINATION: CTA OF THE CHEST 06/19/2018 4:03 pm TECHNIQUE: CTA of the chest was performed after the administration of intravenous contrast.  Multiplanar reformatted images are provided for review.  MIP images are provided for review. Dose modulation, iterative reconstruction, and/or weight based adjustment of the mA/kV was utilized to reduce the radiation dose to as low as reasonably achievable. COMPARISON: None. HISTORY: ORDERING SYSTEM PROVIDED HISTORY: chest pain, worse with inspiration, elevated d dimer TECHNOLOGIST PROVIDED HISTORY: chest pain, worse with inspiration, elevated  d dimer Reason for Exam: elevated d dimer Acuity: Unknown Type of Exam: Unknown FINDINGS: Pulmonary Arteries: Pulmonary arteries are adequately opacified for evaluation.  No evidence of intraluminal filling defect to suggest pulmonary embolism.  Main pulmonary artery is normal in caliber. Mediastinum: No evidence of mediastinal lymphadenopathy.  The heart and pericardium demonstrate no acute abnormality.  There is no acute abnormality of the thoracic aorta. Lungs/pleura: There is a small nodular infiltrate in the right lower lobe. There is no effusion.  There is no pneumothorax.  There is no dominant pulmonary nodule or pulmonary mass.  The tracheobronchial tree is patent. Upper Abdomen: Limited images of the upper abdomen are unremarkable. Soft Tissues/Bones: No acute bone or soft tissue abnormality.     No acute or chronic pulmonary embolism. Small nodular infiltrate in the right lower lobe that may represent an early infectious etiology and follow-up chest CT is recommended in 3 months to assess resolution.       LABS:  I have reviewed and interpreted all available lab results.  Labs Reviewed   CBC WITH AUTO DIFFERENTIAL - Abnormal; Notable for the following components:       Result Value    Seg Neutrophils 66 (*)     All other components within normal limits   BASIC METABOLIC PANEL   TROPONIN   TROPONIN   D-DIMER, QUANTITATIVE       SCREENING TOOLS:    HEART Risk Score for Chest Pain Patients   History and Physical Exam Suspicion Level  (Nausea, Vomiting, Diaphoresis, Radiation, Exertion)   Slightly Suspicious (0 pts)   Moderately Suspicious (1 pt)   Highly Suspicious (2 pts)   EKG Interpretation   Normal (0 pts)   Non-Specific Repolarization Disturbance (1 pt)   Significant ST-Depression (2 pts)   Age of Patient (in years)   = 45 (0 pts)   46-64 (1 pt)   = 65 (2 pts)   Risk Factors   No Risk Factors (0 pts)   1-2 Risk Factors (1 pt)   = 3 Risk Factors (2 pts)   Risk Factors  Include:   Hypercholesterolemia   Hypertension   Diabetes Mellitus   Cigarette smoking  Positive family history   Obesity   CAD   (SLE, CKDz, HIV, Cocaine abuse)   Troponin Levels   = Normal Limit (0 pts)   1-3 Times Normal Limit (1 pt)   > 3 Times Normal Limit (2 pts)  TOTAL:3    Percent Risk for Major Adverse Cardiac Event (MACE)  0-3 pts indicates low risk for MACE   2.5% (DISCHARGE)   4-7 pts indicates moderate risk for MACE  20.3% (OBS)  8-10 pts indicates high risk for MACE  72.7% (EARLY INVASIVE TX)    CDU IMPRESSION / PLAN      ASHLA MURPH is a 58 y.o. female who presents with acute mid sternal chest pain with radiation to right arm, etiology unknown, initial counter    Patient underwent ACS/MI and PE work-up emergency department was unremarkable.  Patient will need repeat CT chest in 3 months for evaluation of nodule and resolution.    Patient into the observation for cardiology evaluation.  Cardiology planning for stress test today, if normal discharge home.    Pain control with IV Toradol, p.o. Tylenol p.o. nitroglycerin  ?? Continue home medications and pain control  ?? Monitor vitals, labs, and imaging  ?? DISPO: pending consults and clinical improvement    CONSULTS:    IP CONSULT TO CARDIOLOGY    PROCEDURES:  Not indicated       PATIENT REFERRED TO:    Anise Salvo, APRN - CNP  [address] [place] [address]  [phone]            --  Driscilla Grammes, DO  Emergency Medicine Resident     This dictation was generated by voice recognition computer software.  Although all attempts are made to edit the dictation for accuracy, there may be errors in the transcription that are not intended.

## 2018-06-20 NOTE — Plan of Care (Signed)
BRONCHOSPASM/BRONCHOCONSTRICTION     [x]         IMPROVE AERATION/BREATH SOUNDS  [x]   ADMINISTER BRONCHODILATOR THERAPY AS APPROPRIATE  [x]   ASSESS BREATH SOUNDS  []   IMPLEMENT AEROSOL/MDI PROTOCOL  [x]   PATIENT EDUCATION AS NEEDED

## 2018-06-20 NOTE — Procedures (Signed)
[facility]                  [address] [place], [place] [address]                              CARDIAC STRESS TEST    PATIENT NAME: Shannon Allison, Shannon Allison                   DOB:        10/10/1960  MED REC NO:   [id number]                             ROOM:       0343  ACCOUNT NO:   [id number]                           ADMIT DATE: 06/19/2018  PROVIDER:     Lamount Cranker STRESS STUDY    DATE OF STUDY:  06/20/2018  ORDERING PROVIDER:  Rondall Allegra  PRIMARY CARE PROVIDER:  D. Joselyn Glassman, APRN-CNP  INDICATION: Chest pain  CONSENT: The test was explained and consent was signed.    PROTOCOL: Lexiscan, 0.4 mg infused.  PREINFUSION EKG: Abnormal-normal sinus rhythm, left axis deviation,  inferior Q waves noted.  PREINFUSION HR: 63 bpm, infusion HR, 118 bpm.  HR response to Lexiscan  was normal.  PREINFUSION BP: 150/99 mmHg, infusion BP, 150/99 mmHg.  BP response to  Lexiscan was appropriate.  CHEST PAIN:  Chest discomfort (10 of 10) noted with Lexiscan infusion.  LEXISCAN EKG:  No changes noted.  ISCHEMIC EKG CHANGES: None.    IMPRESSION  Electrocardiographically negative Lexiscan stress study.  **Cardiolite report issued from the department of Nuclear Medicine**      Kobyn Kray [facility]    D: 06/20/2018 14:24:53       T: 06/20/2018 14:25:30     [id number]  Job#: [id number]     Doc#: Unknown

## 2018-06-21 NOTE — Telephone Encounter (Signed)
Pt's mother called in stating pt is moving to [place] sooner than planned. She is now moving 4/7 instead of in May. She was asking to get 90 day supply of her medications prior to her leaving so that she is covered until she gets established down there. Upon looking at her chart, she has 90 days of her Sinemet but needs 90 days of her Requip. I called pt's mother back, LM asking if the medication needed to go to [facility] or to one in [place]. She called right back and asked that it just be sent to [facility]. This was done.

## 2018-06-24 MED ORDER — ROPINIROLE HCL 4 MG PO TABS
4 MG | ORAL_TABLET | Freq: Three times a day (TID) | ORAL | 0 refills | Status: AC
Start: 2018-06-24 — End: 2018-09-22

## 2018-06-26 NOTE — Telephone Encounter (Signed)
Spoke to patient and I mailed the CD from [facility] to her at [address] [place], [place] [address].  KS

## 2018-11-27 IMAGING — MG MAMMO SCRN BIL W/CAD TOMO
8 series · 9 of 24 positions shown · non-contrast
Comparison: This is a baseline study.

Images Obtained from [facility]
INDICATION: Screening.
TECHNIQUE: Bilateral 2-D digital screening mammogram was performed followed by 3-D tomosynthesis.  Current study was also evaluated with a computer aided detection (CAD) system.
MAMMOGRAM FINDINGS:
The breasts are almost entirely fatty.
No suspicious abnormality is seen in either breast.

[L MLO]
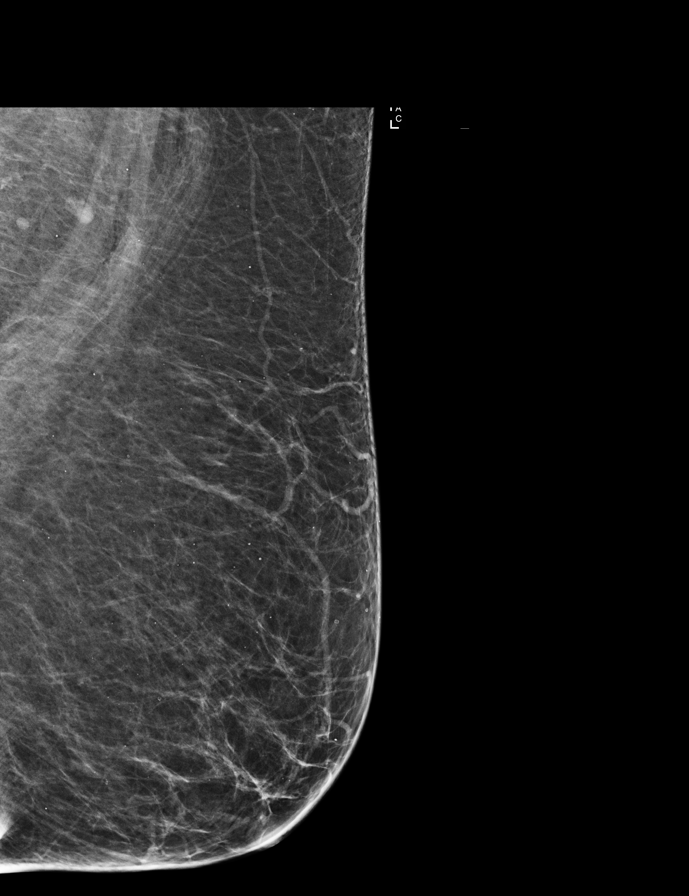

[R MLO]
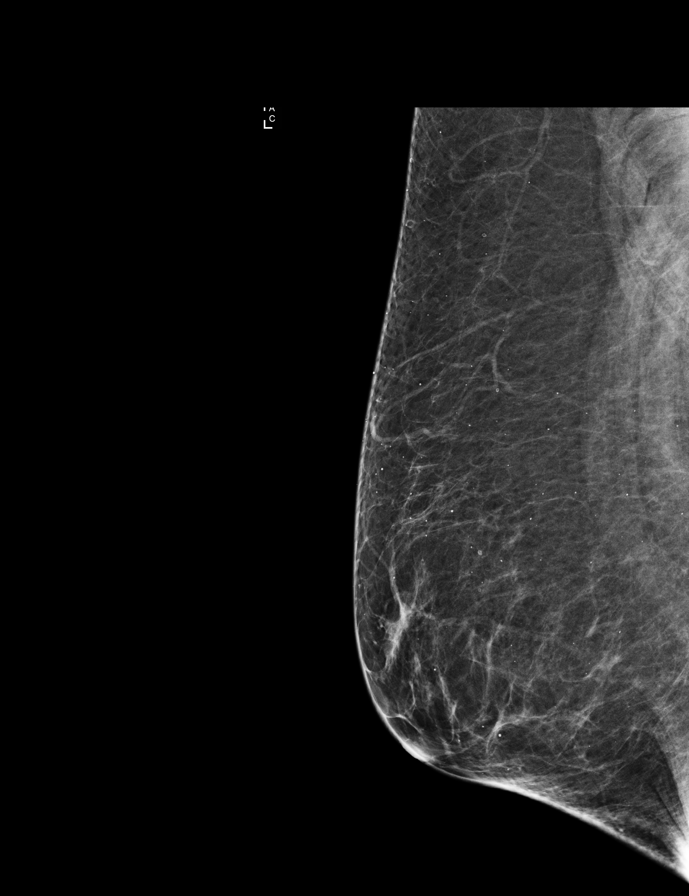

[L CC]
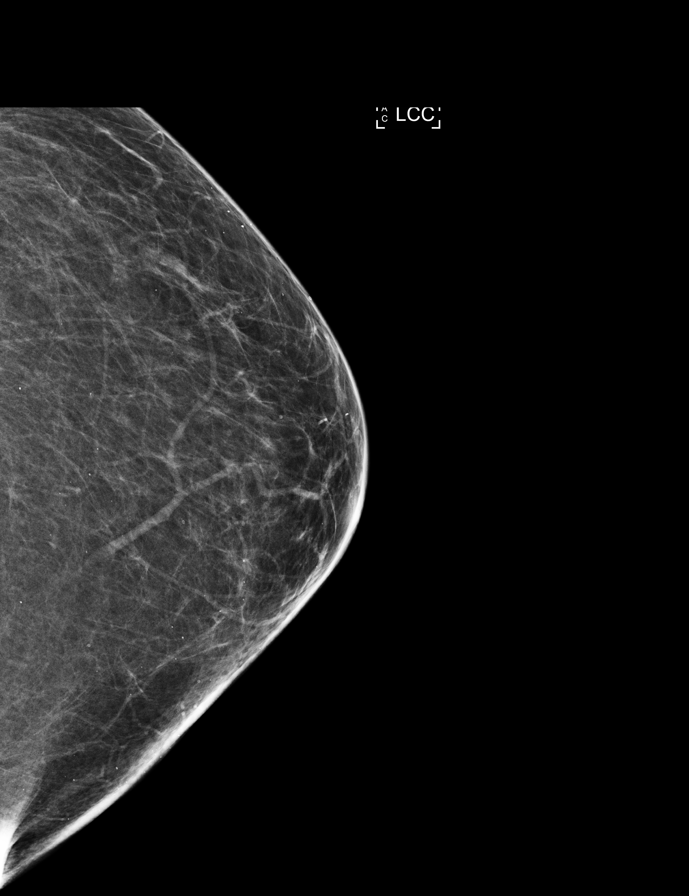

[R CC]
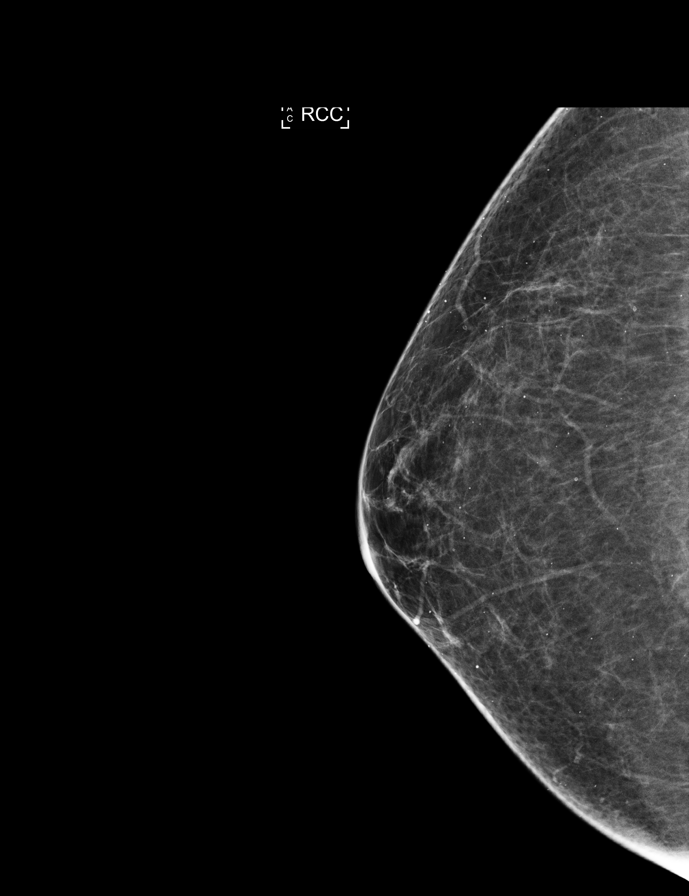

[R MLO tomo · 2 of 62 frames shown]
[frame 21/62]
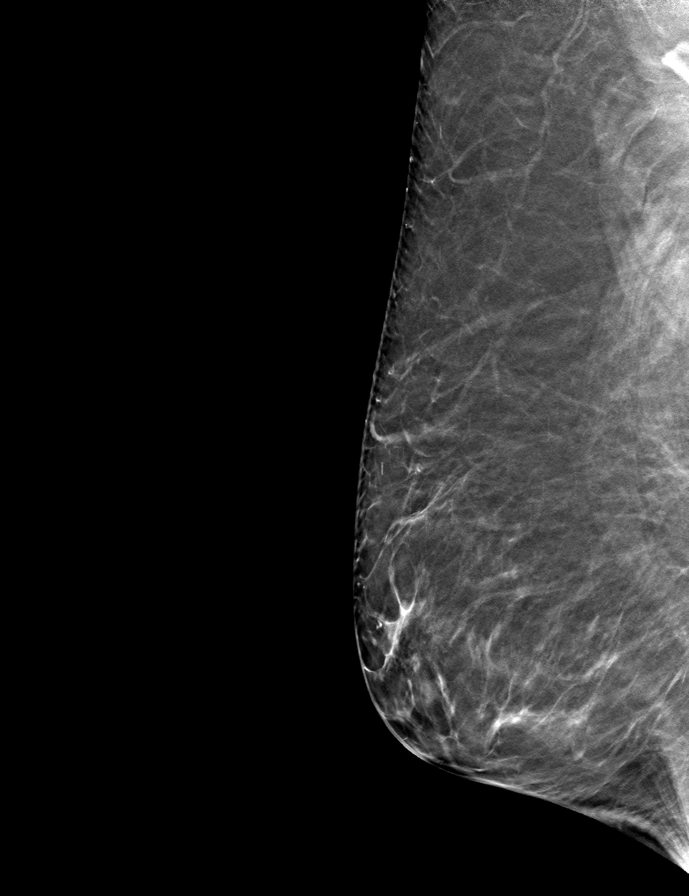
[frame 31/62]
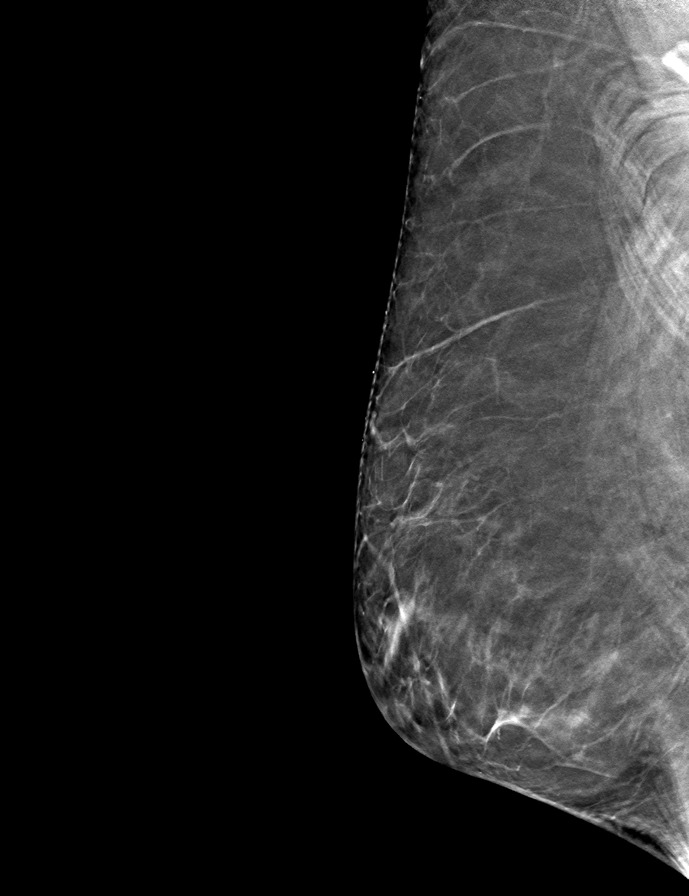

[L CC tomo · tomo slice 30/59.0]
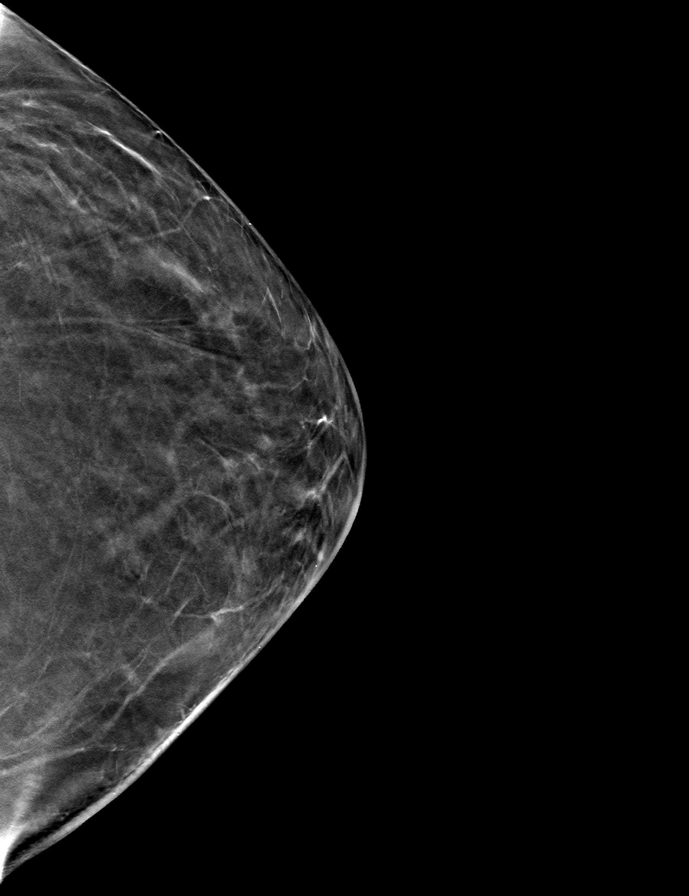

[R CC tomo · tomo slice 31/62.0]
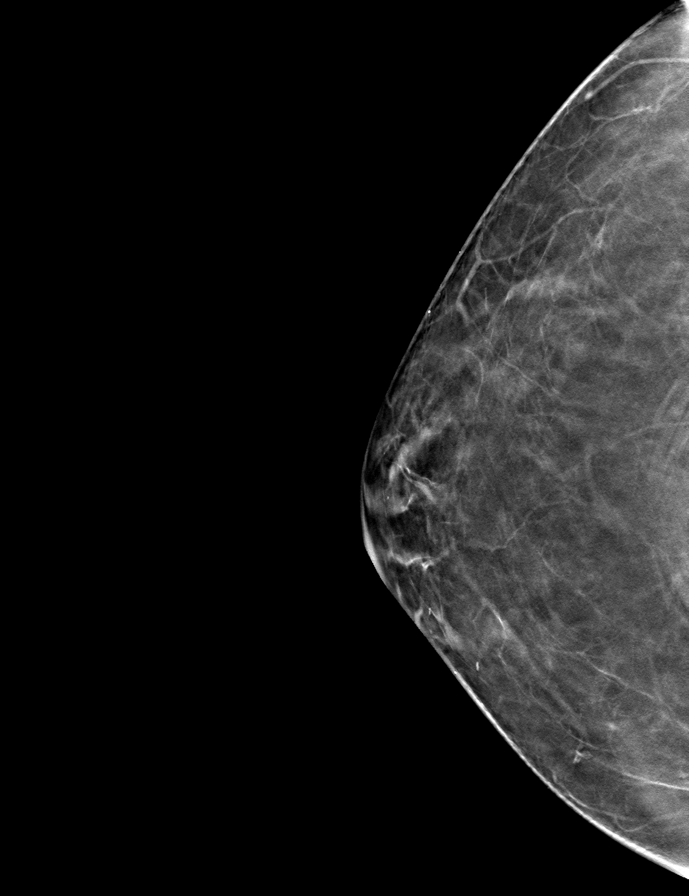

[L MLO tomo · tomo slice 31/61.0]
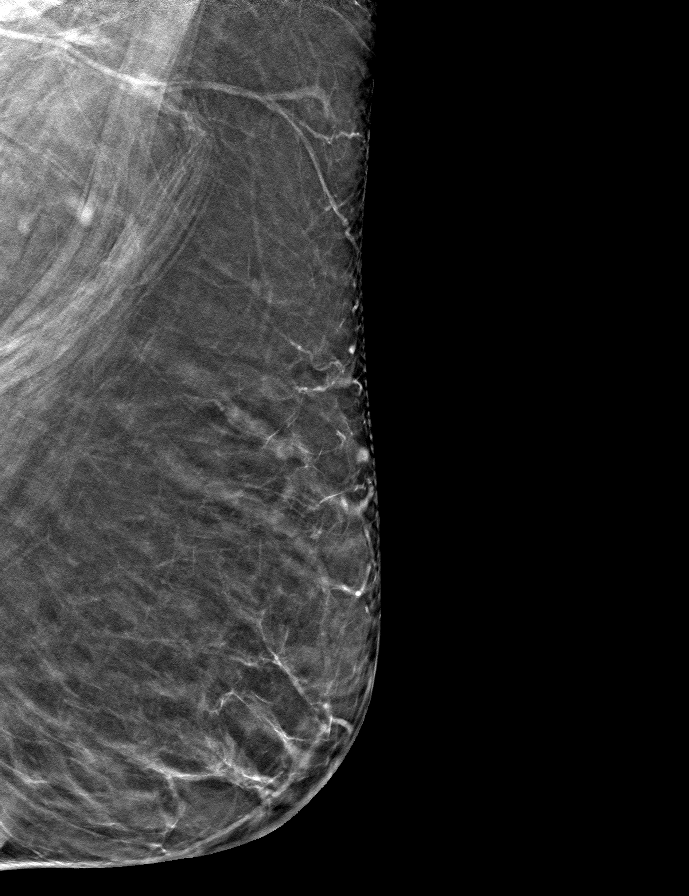

[9 of 24 positions shown; findings below may reference images not displayed]

IMPRESSION: There is no mammographic evidence of malignancy.
Screening mammogram recommended in 1 year.
BI-RADS Category 1: Negative

## 2019-02-19 IMAGING — CT CT ABD/PEL WO CONT
2 of 4 series · 14 of 46 positions shown, 16 images · non-contrast
Comparison: None.

HISTORY: Rectal bleeding, pelvic pain.
TECHNIQUE: Axial CT images were obtained through abdomen and pelvis without administration of IV contrast.  Coronal and sagittal reformatted images were generated from the original data set. Oral contrast was administered.

[Series 2: soft tissue · axial · 0.73mm/px · z∈[-1536,-1131]mm · 11 of 95 slices shown, 13 images]
[im 9/95  soft-tissue]
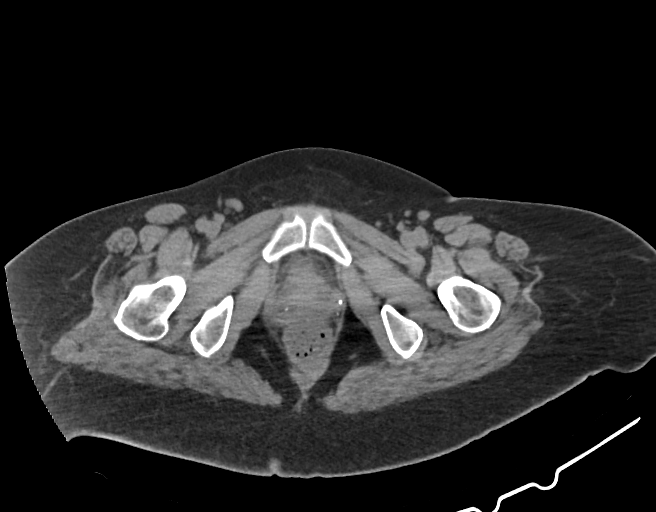
[im 9/95  bone]
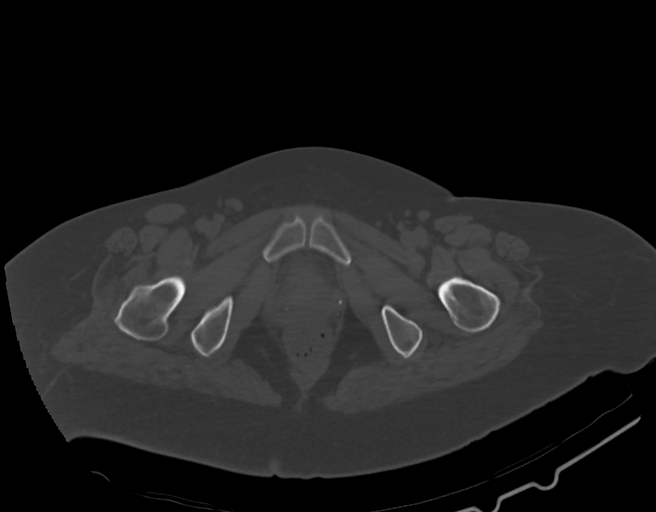
[im 17/95  soft-tissue]
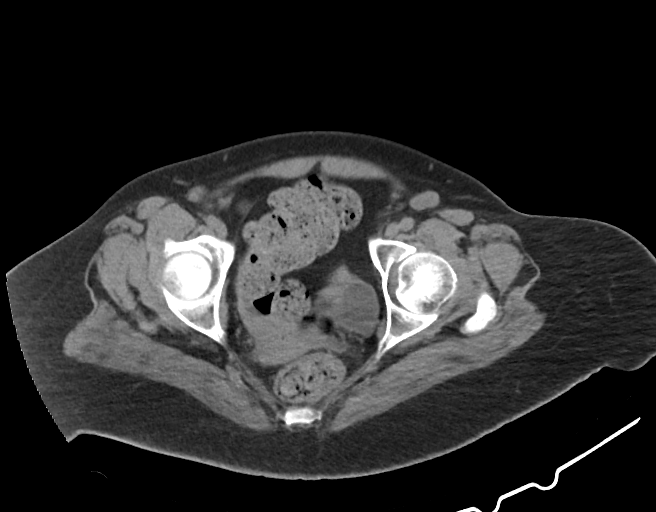
[im 25/95  soft-tissue]
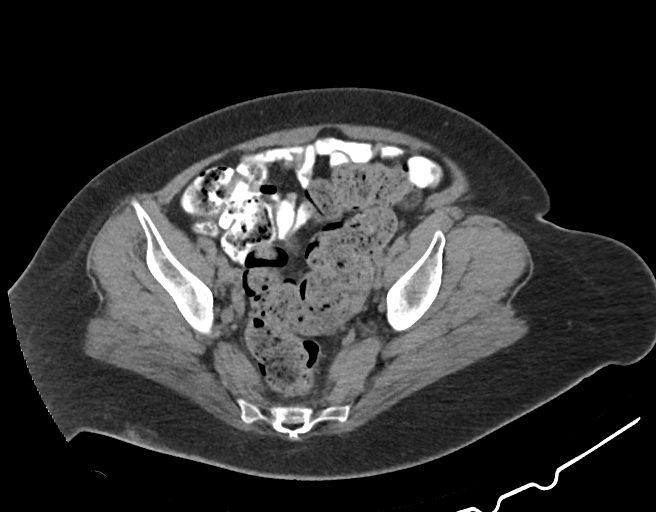
[im 33/95  soft-tissue]
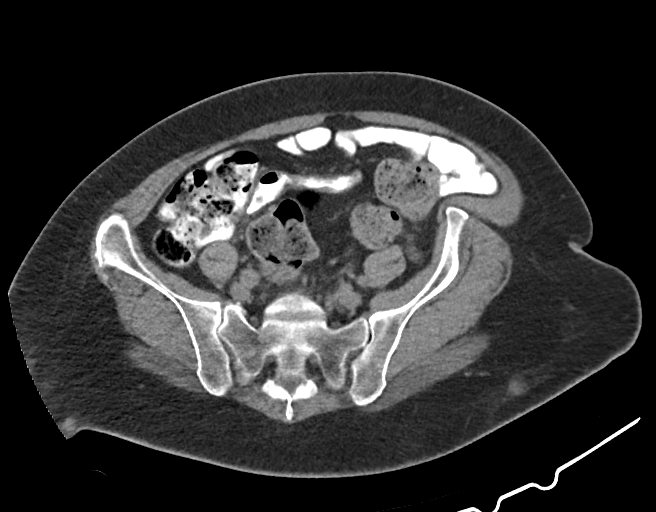
[im 41/95  soft-tissue]
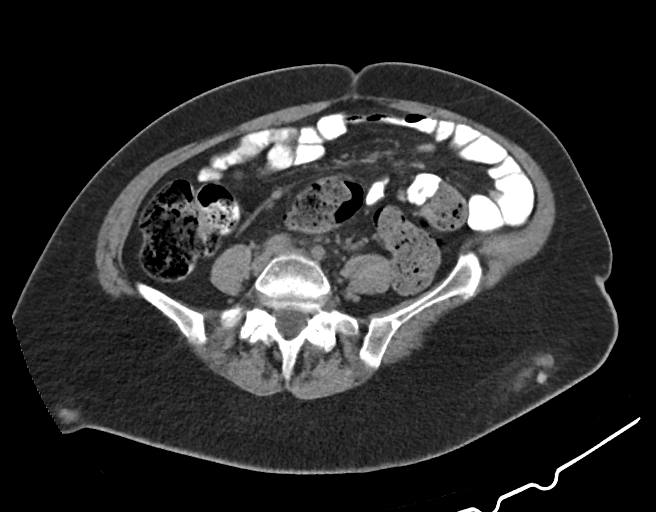
[im 50/95  soft-tissue]
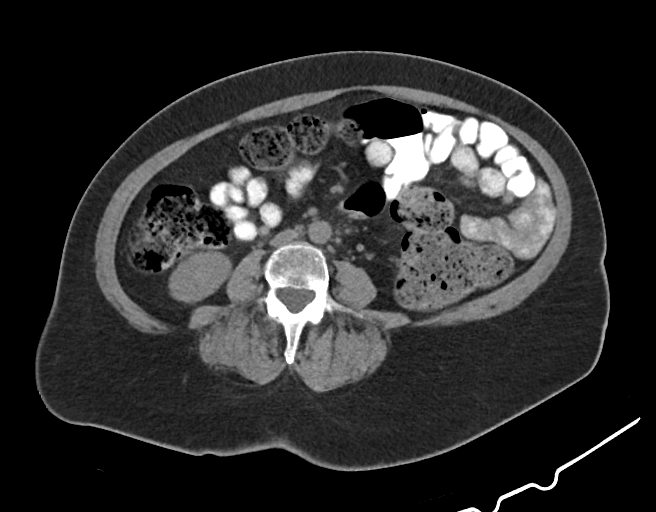
[im 58/95  soft-tissue]
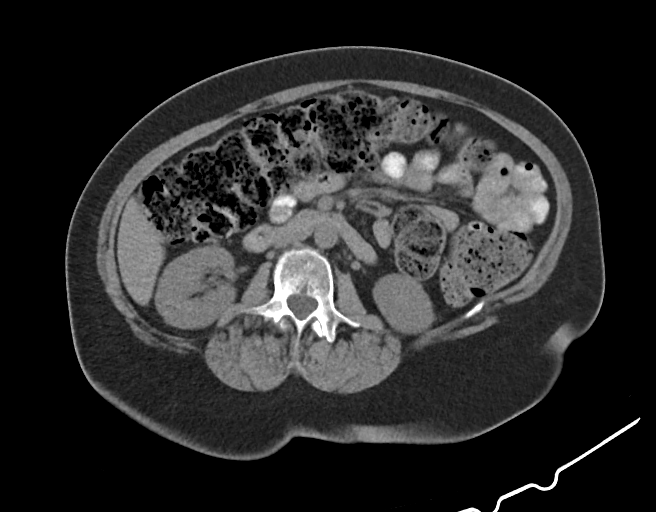
[im 66/95  soft-tissue]
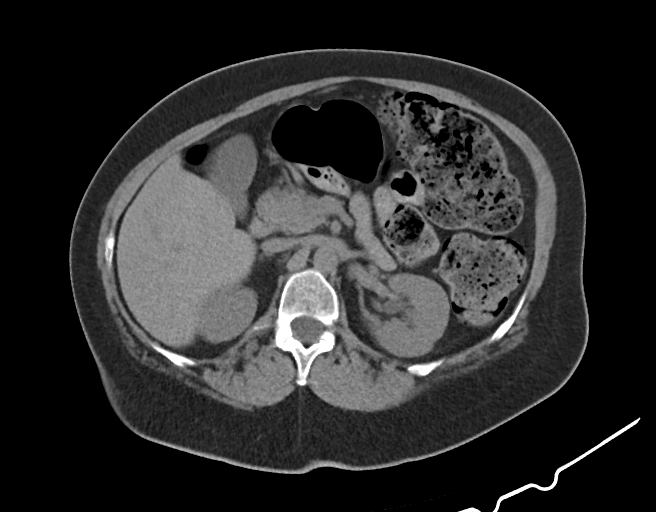
[im 74/95  soft-tissue]
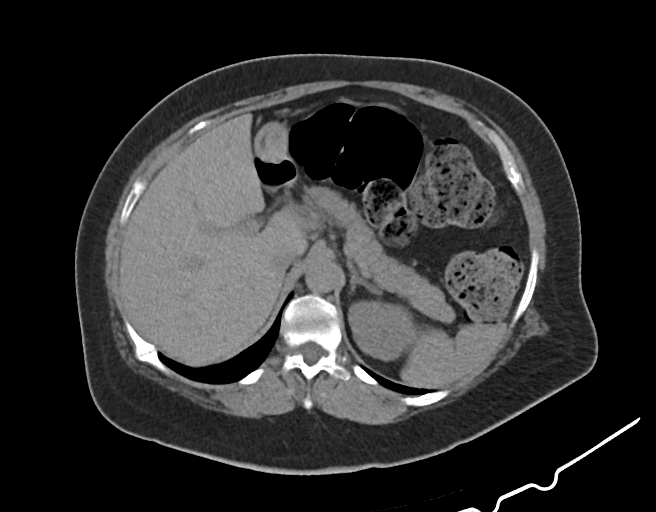
[im 74/95  bone]
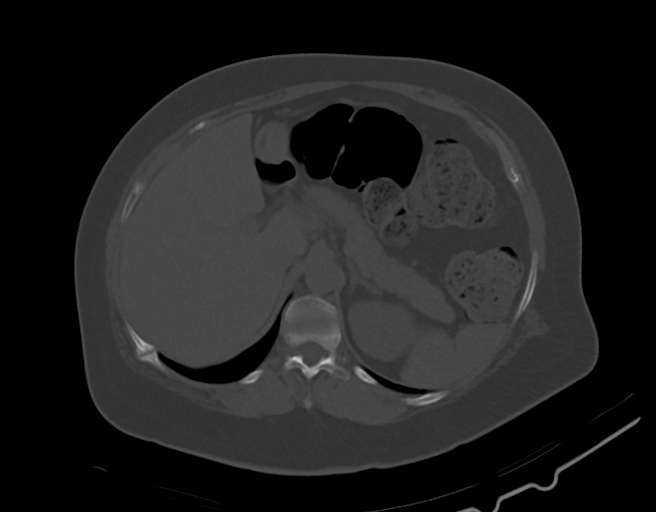
[im 82/95  soft-tissue]
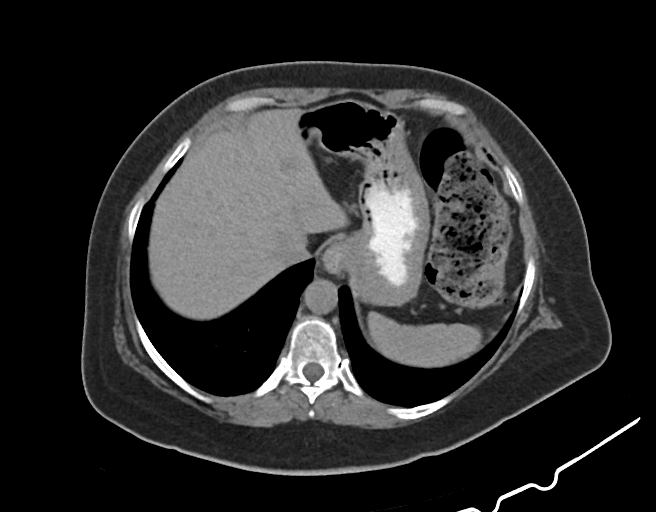
[im 90/95  soft-tissue]
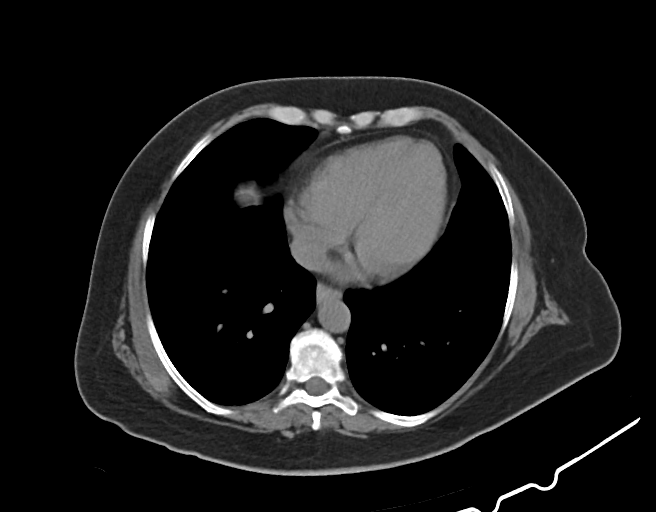

[Series 4: coronal · coronal · 0.94mm/px · 3 of 63 slices shown]
[im 21/63  soft-tissue]
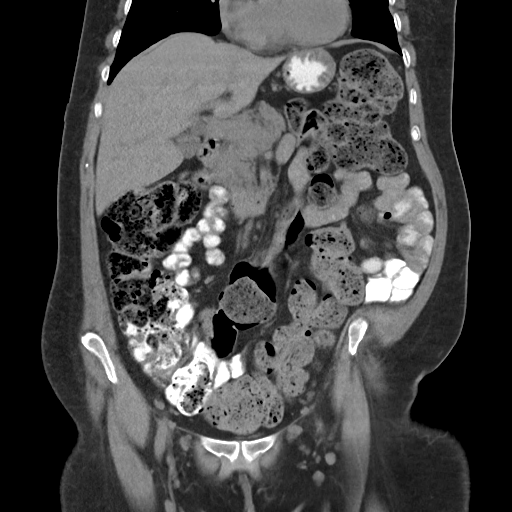
[im 28/63  soft-tissue]
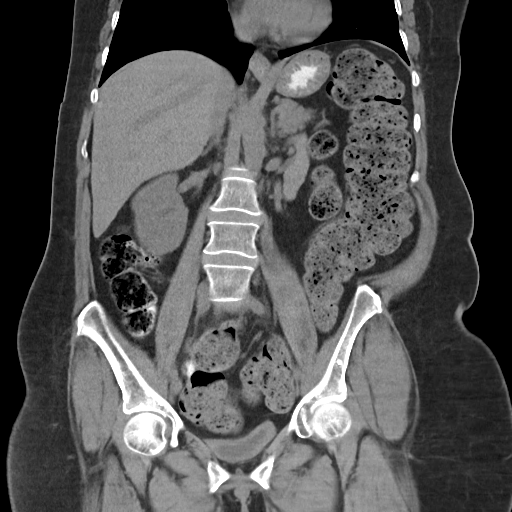
[im 35/63  soft-tissue]
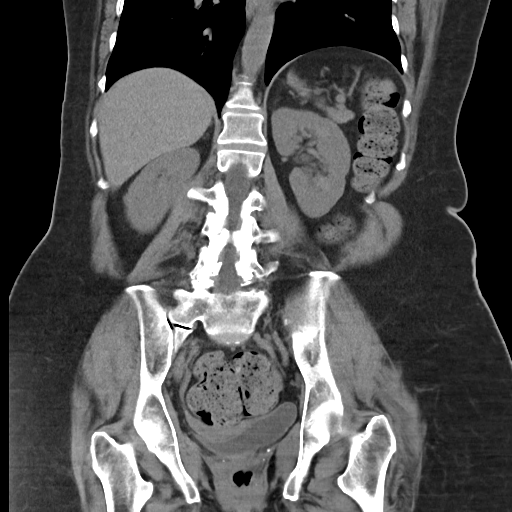

[14 of 46 positions shown; findings below may reference images not displayed]

FINDINGS: LUNGS BASES: There is a small hiatal hernia.

HEPATOBILIARY: There is a suggestion of a subtle 1.5 cm low-attenuation left hepatic lobe lesion. No calcified gallstones are seen.

SPLEEN: Normal.

PANCREAS: Normal.

ADRENAL GLANDS: Normal.

KIDNEYS: No calculi are seen.

RETROPERITONEUM: Normal.

BOWEL: There is a copious volume of stool throughout the entire colon suggesting constipation. The administered oral contrast has just begun to opacify the ascending colon. Appendix is not identified with certainty.The sigmoid colon is noted to be redundant.

PELVIS: The uterus is atrophic. Bladder is unremarkable. No free fluid is seen.

OSSEOUS STRUCTURES: There is a levocurvature in the lumbar spine. There is moderate distal lumbar facet arthrosis. There is a non-acute-appearing fracture involving a right lower posterior rib.

OTHER: None.
IMPRESSION: 1.
Copious amount of stool seen throughout the entire colon suggests constipation. 

2.
Suggestion of a 1.5 cm indeterminate low-attenuation left hepatic lobe lesion. Suggest follow-up enhanced exam with CT or MRI.

3.
Small hiatal hernia.

4.
Additional comments and findings as above.

STAT FAX

Total radiation dose to patient is CTDIvol 11.91 mGy and DLP 609.30 mGy-cm.

## 2019-03-04 IMAGING — MR MRI ABDOMEN WO/W CONTRAST
5 of 9 series · 17 of 48 positions shown · IV contrast (prohance)
Comparison: CT abdomen and pelvis 02/19/2019

REASON FOR EXAM: Liver disease
TECHNIQUE: Multiplanar, multisequence MR images of the abdomen were acquired before and after the administration of IV contrast.

IV Contrast: 20 mL ProHance

[Series 1: bSSFP · axial · 8.0mm · 1.76mm/px · 1 of 22 slices shown]
[im 1/22]
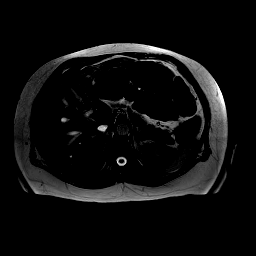

[Series 2: t2_cor_haste · coronal · 6.0mm · 0.73mm/px · 2 of 24 slices shown]
[im 1/24]
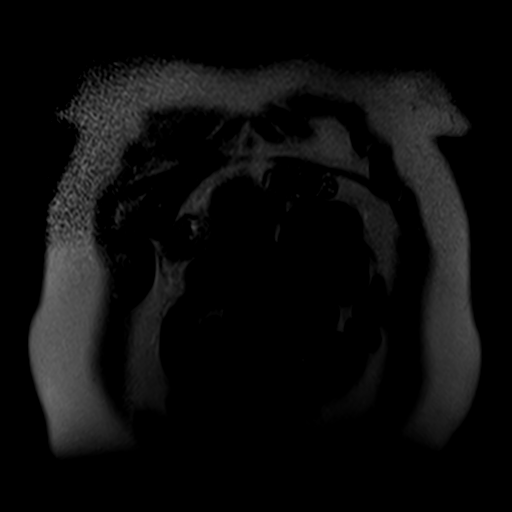
[im 24/24]
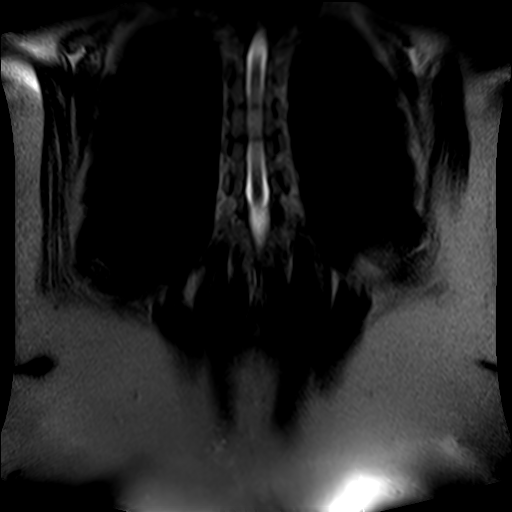

[Series 3: t2_axial_fs · axial · 6.0mm · 1.68mm/px · z∈[-92,+116]mm · 3 of 30 slices shown]
[im 1/30]
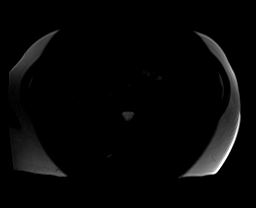
[im 15/30]
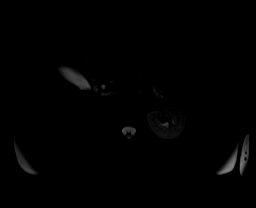
[im 30/30]
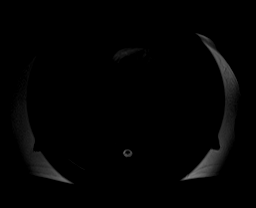

[Series 4: t1_vibe_(person_name)_axial_fs_in · axial · 3.0mm · 0.67mm/px · z∈[-96,+117]mm · 7 of 72 slices shown]
[im 1/72]
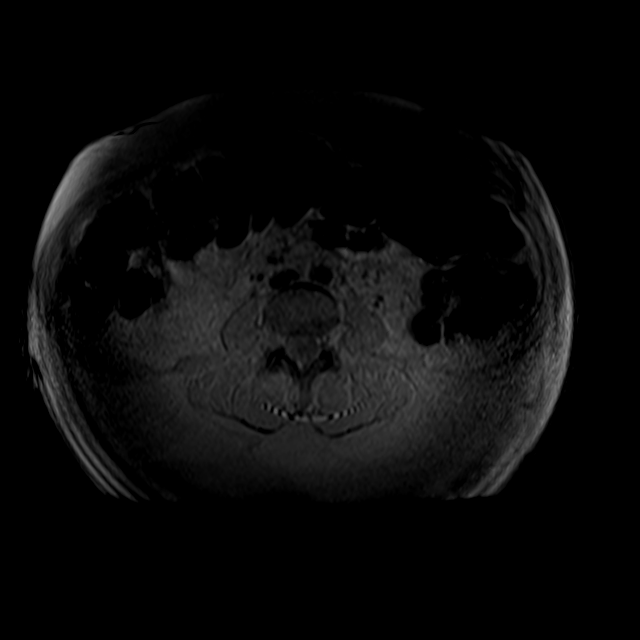
[im 12/72]
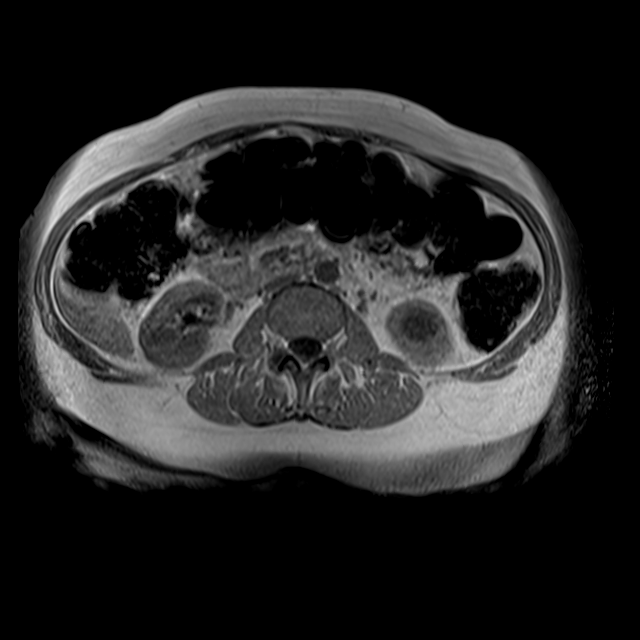
[im 24/72]
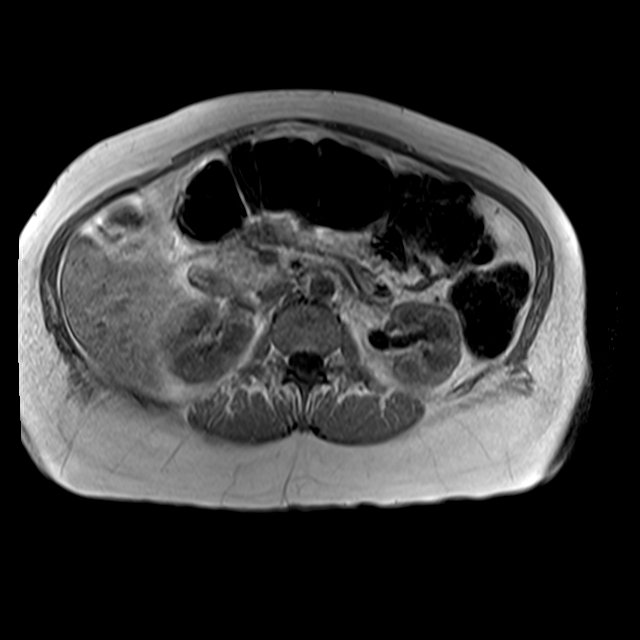
[im 36/72]
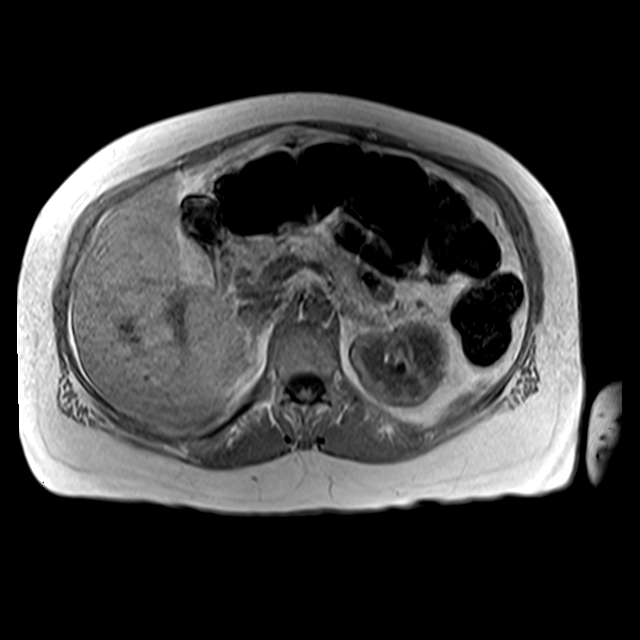
[im 48/72]
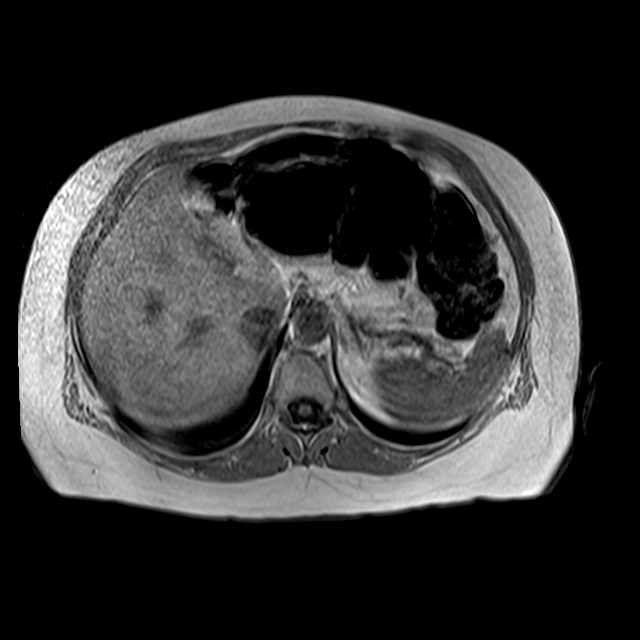
[im 60/72]
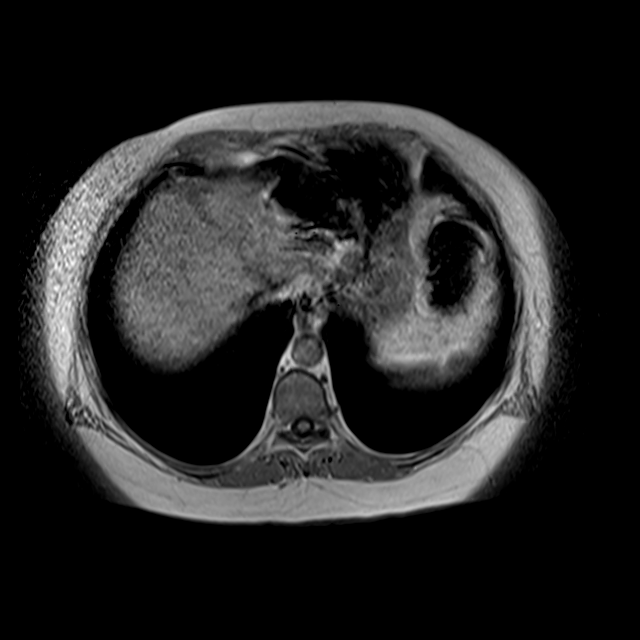
[im 72/72]
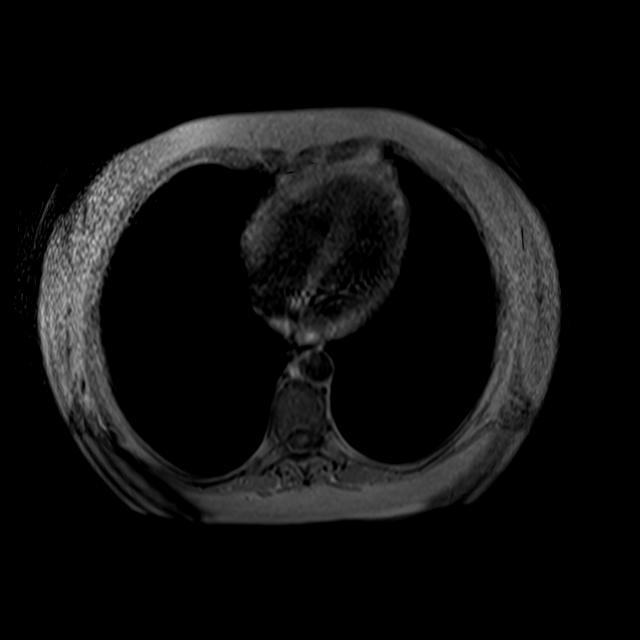

[Series 5: t1_vibe_(person_name)_axial_fs_opp · axial · 3.0mm · 0.67mm/px · z∈[-96,+9]mm · 4 of 72 slices shown]
[im 1/72]
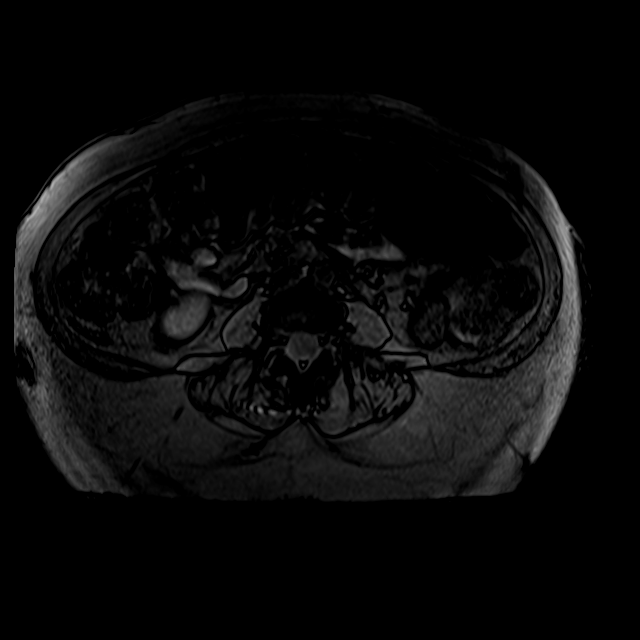
[im 12/72]
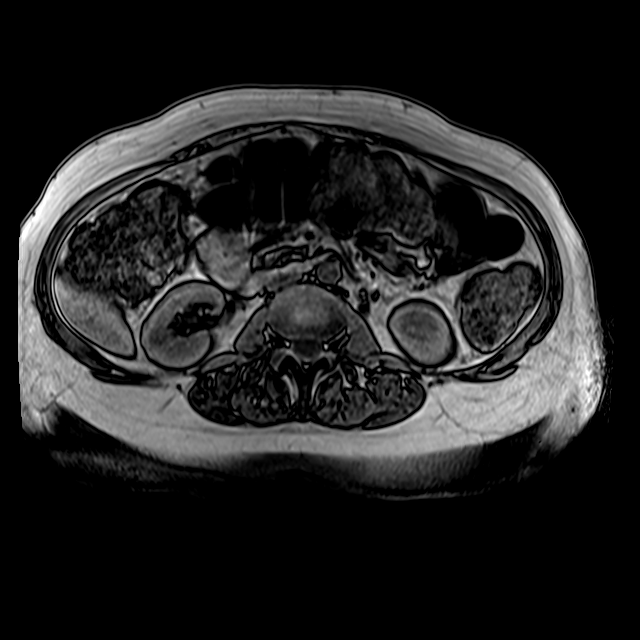
[im 24/72]
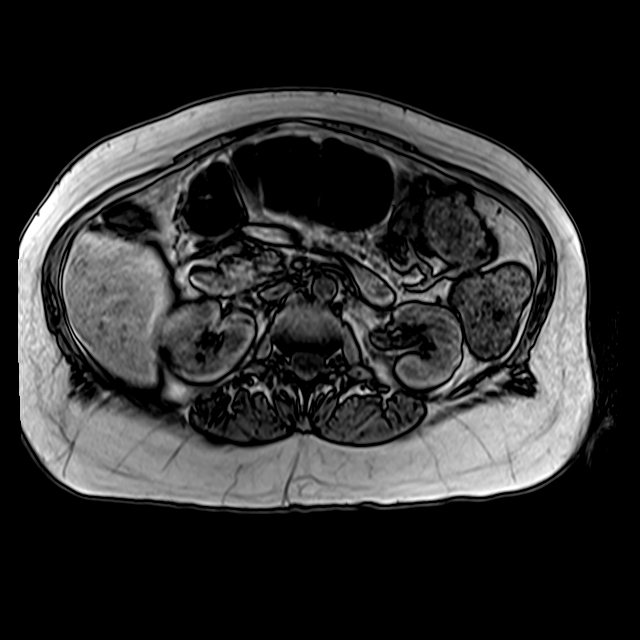
[im 36/72]
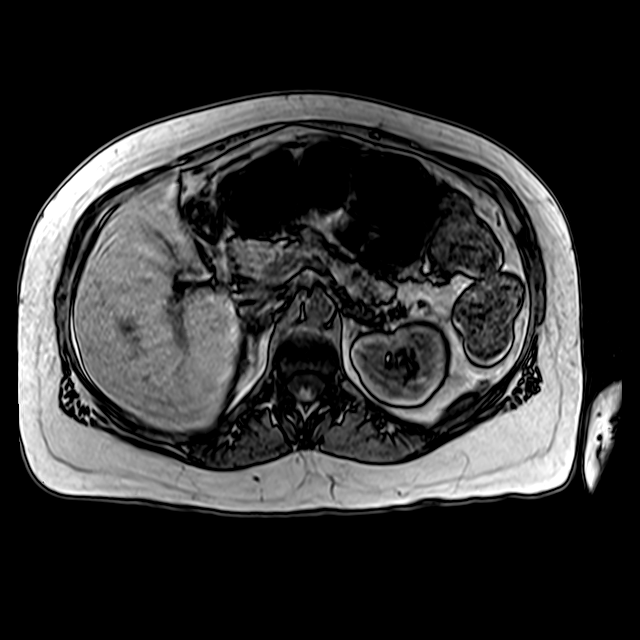

[17 of 48 positions shown; findings below may reference images not displayed]

FINDINGS: Mildly enlarged liver. Diminutive left hepatic lobe. The liver does not display signal loss on T1-weighted opposed phase images.

No suspicious enhancement or washout in the liver. In particular, the left hepatic lobe demonstrates no concerning lesion.

The hepatic, portal, splenic, and superior mesenteric veins are patent. Spleen size is normal. No ascites.

Unremarkable gallbladder. The pancreas, adrenal glands, and right kidney are normal. Left renal pelvocaliectasis is probably a transient finding. The proximal ureters are not dilated.

Other than a large colonic stool burden, the visualized bowel is normal. No bowel obstruction. No enlarged lymph nodes in the abdomen.

Marrow signal intensity is normal. The lung bases are clear.
IMPRESSION: No worrisome hepatic lesion by MRI. The left hepatic lobe CT finding was due to a confluence of normal portal venules.

## 2020-03-10 IMAGING — US US BREAST LT LTD
1 series · 8 of 8 positions shown · non-contrast
Comparison: 11/27/2018

INDICATION: Left breast lump.
TECHNIQUE: Bilateral 2-D digital diagnostic mammogram was performed followed by 3-D tomosynthesis. Current study was also evaluated with a computer aided detection (CAD) system. Targeted left  breast ultrasound was also performed.

[Series 1: us breast left ltd · 8 of 8 slices shown]
[im 1/8]
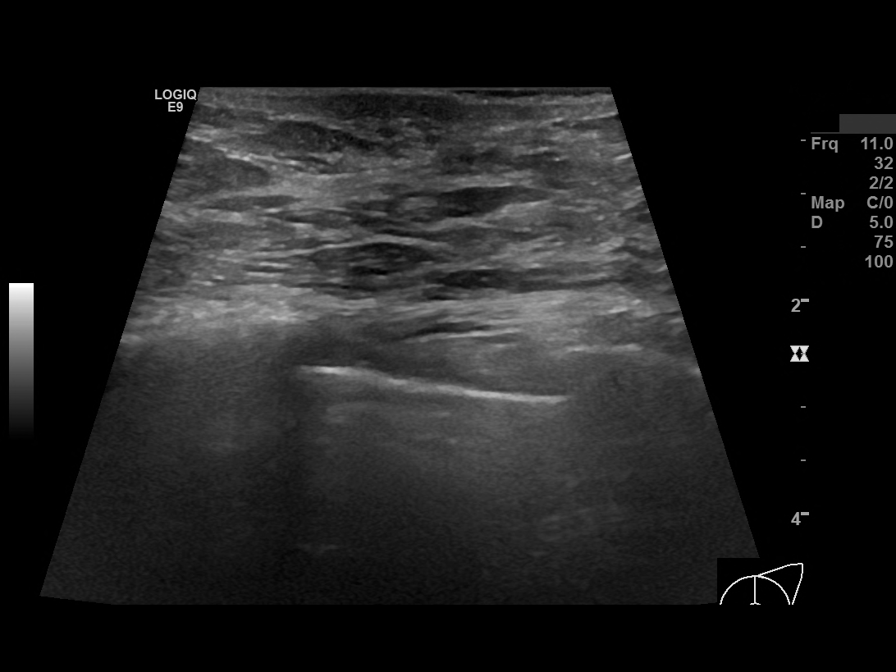
[im 2/8]
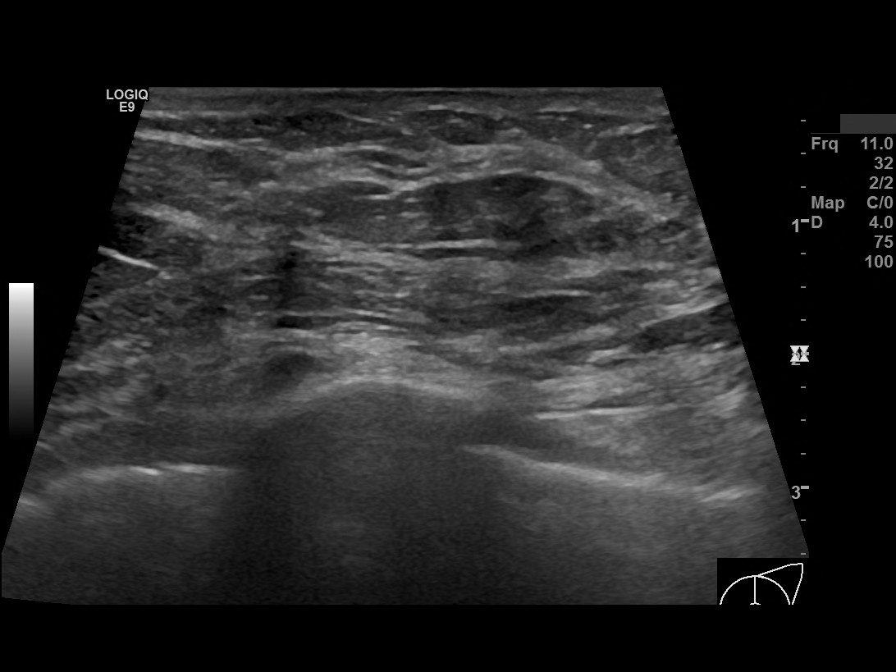
[im 3/8]
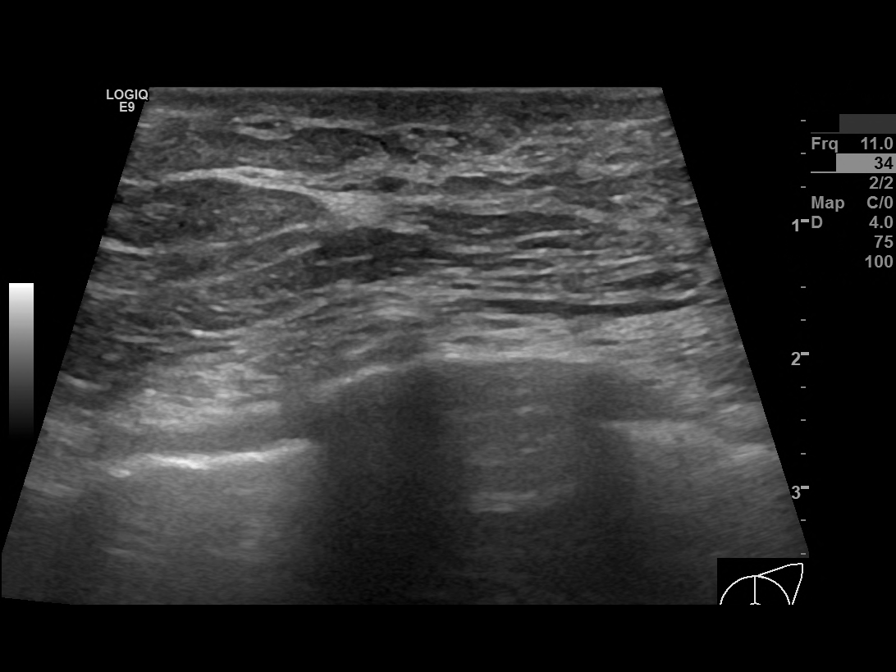
[im 4/8]
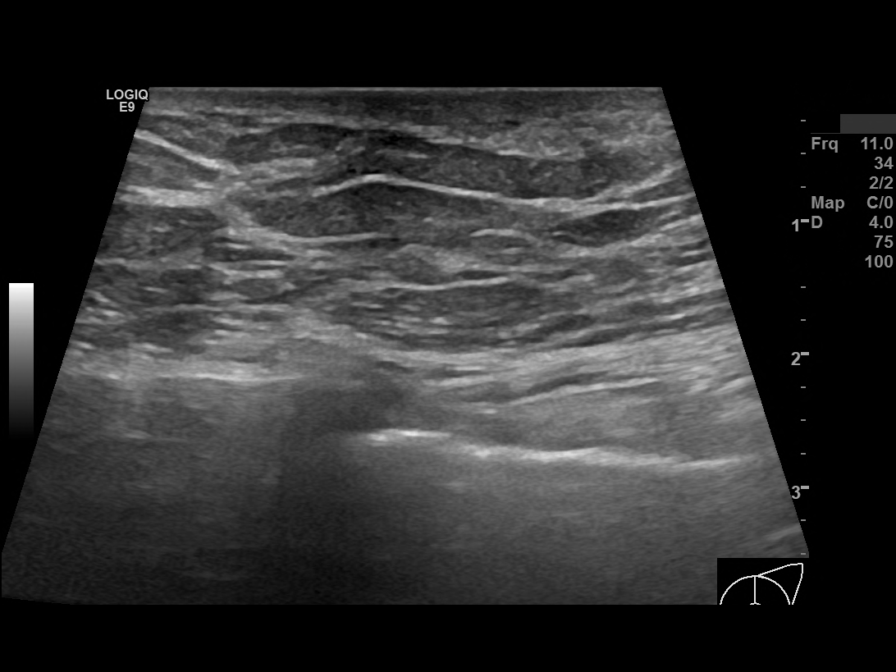
[im 5/8]
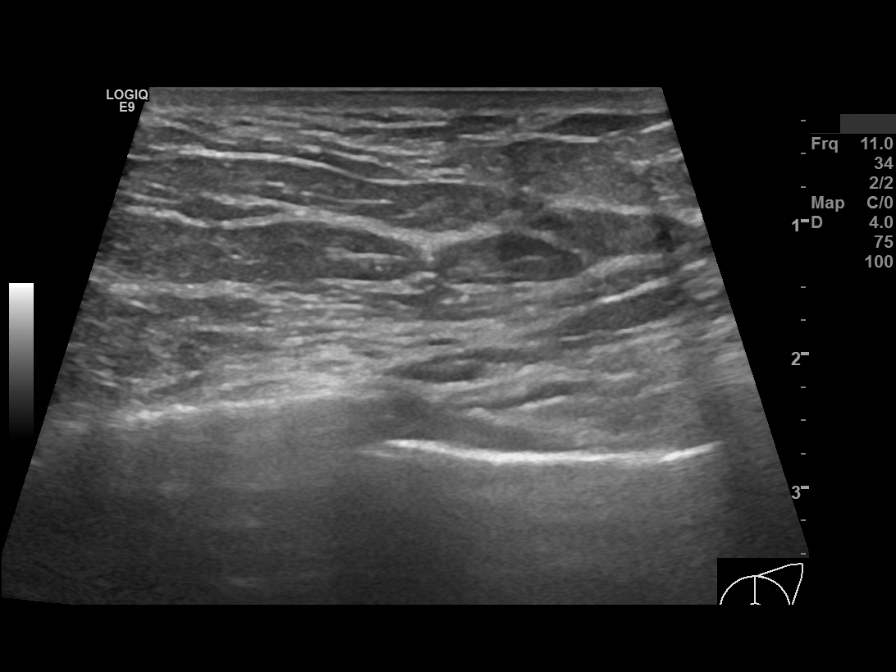
[im 6/8]
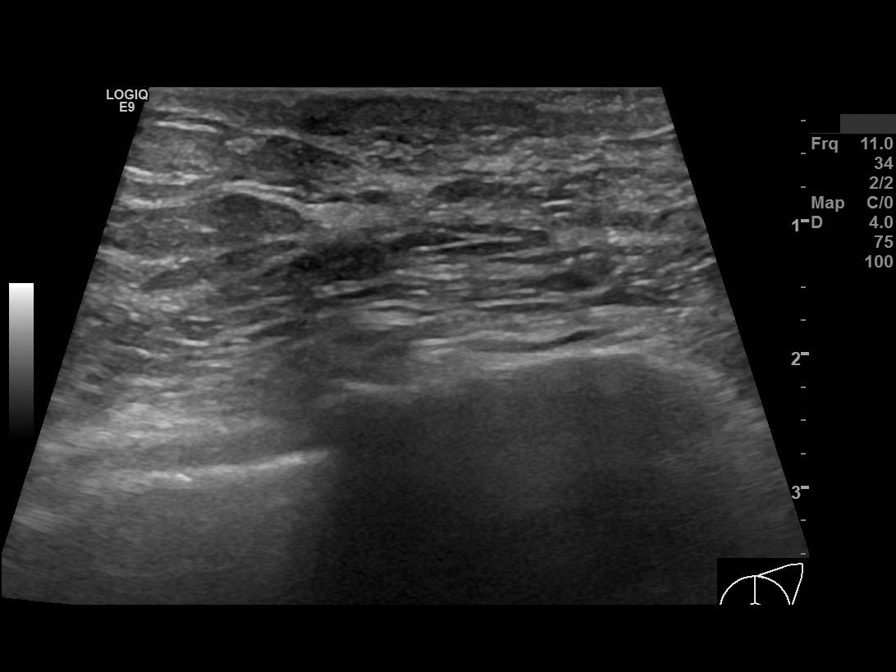
[im 7/8]
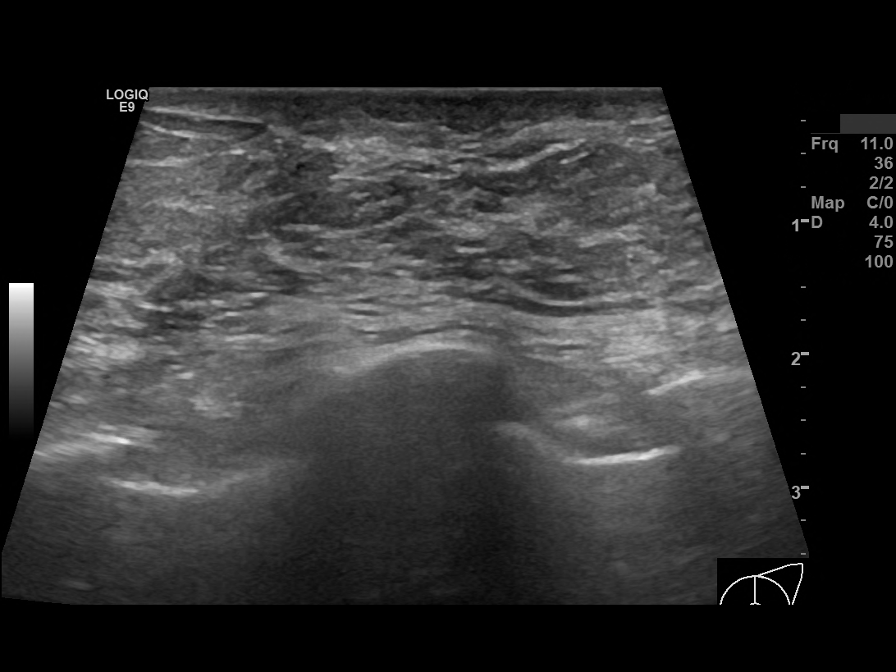
[im 8/8]
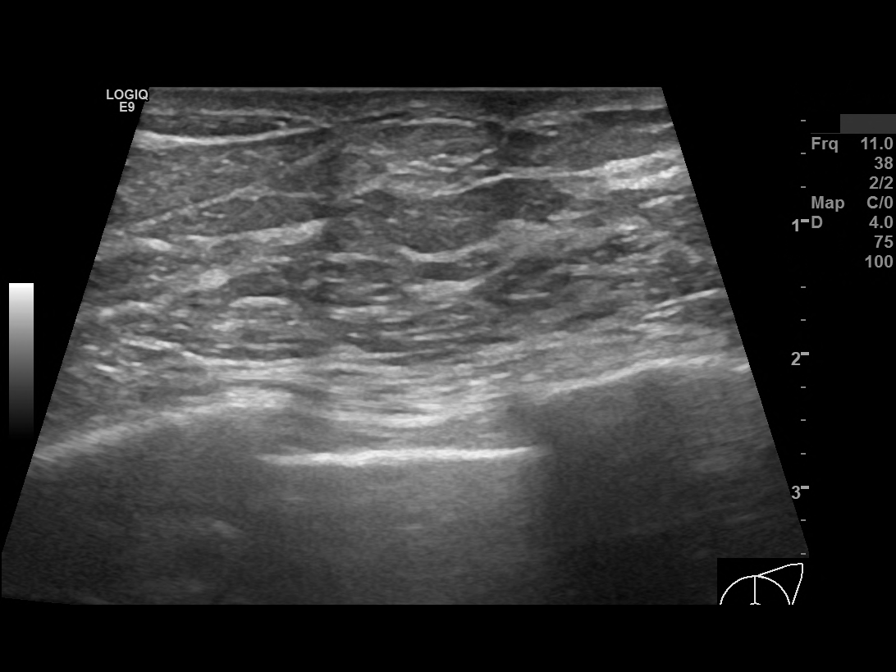

[8 of 8 positions shown; findings below may reference images not displayed]

FINDINGS: LEFT DIAGNOSTIC MAMMOGRAM:  The breast is almost entirely fatty. No significant interval change. No suspicious finding is seen in either breast.

TARGETED LEFT BREAST ULTRASOUND: No sonographic abnormality was seen in the palpable area of concern in the retroareolar left breast.
IMPRESSION: There is no mammographic or targeted sonographic evidence of malignancy. A one year screening mammogram is recommended.  

No mammographic or sonographic correlate to the palpable area of concern in the left breast which likely represents normal tissue. Clinical follow-up recommended.

The patient received a copy of the results at the end of the examination. 

FINAL ASSESSMENT: BI-RADS: Category 1 Negative

## 2020-03-10 IMAGING — MG MAMMO DIAG BIL W/CAD TOMO
8 series · 8 of 24 positions shown · non-contrast
Comparison: 11/27/2018

INDICATION: Left breast lump.
TECHNIQUE: Bilateral 2-D digital diagnostic mammogram was performed followed by 3-D tomosynthesis. Current study was also evaluated with a computer aided detection (CAD) system. Targeted left  breast ultrasound was also performed.

[L CC]
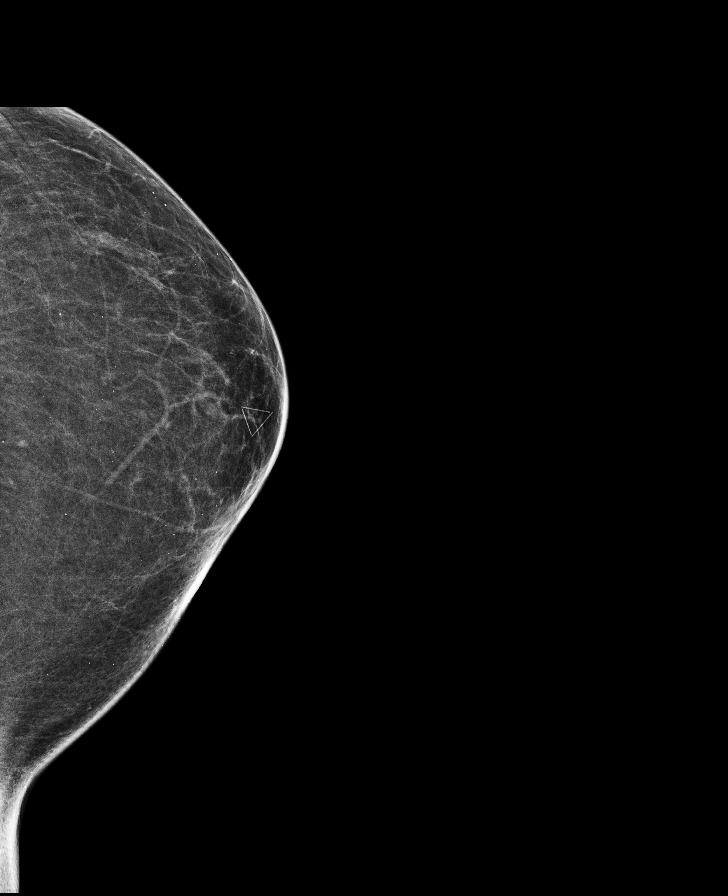

[R CC]
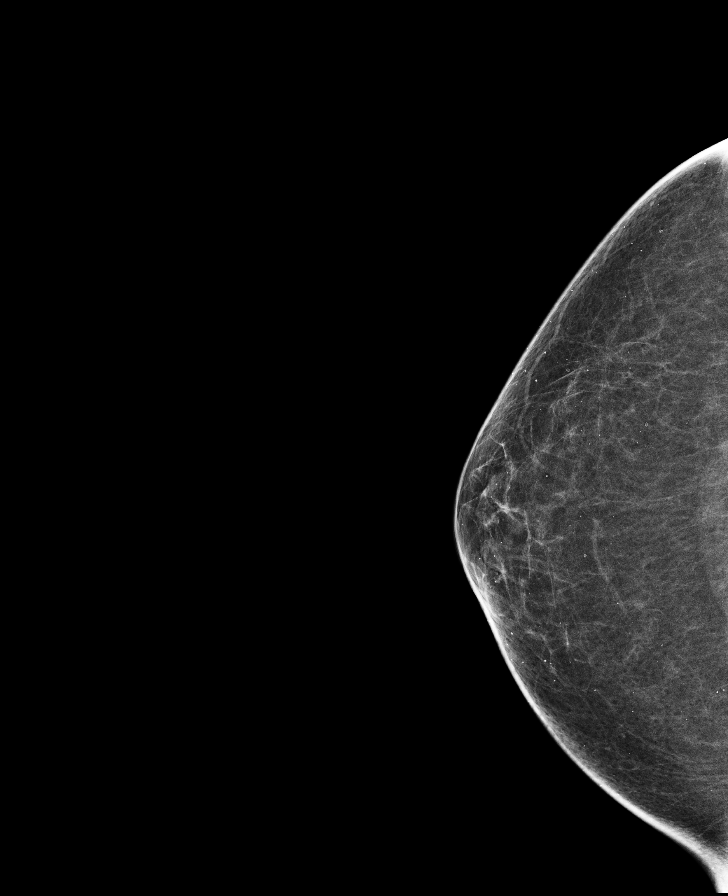

[R MLO]
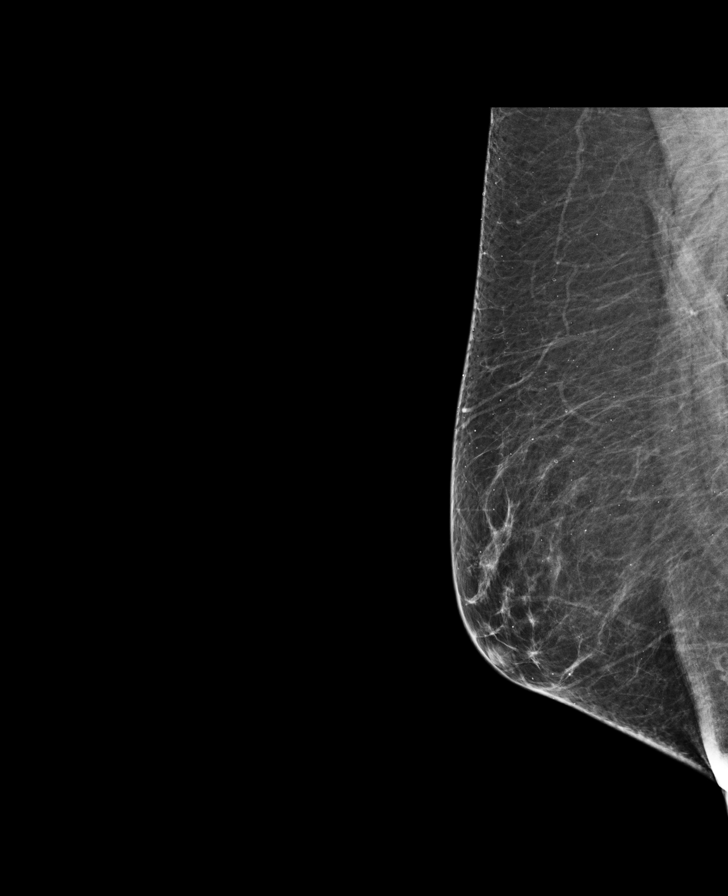

[L MLO]
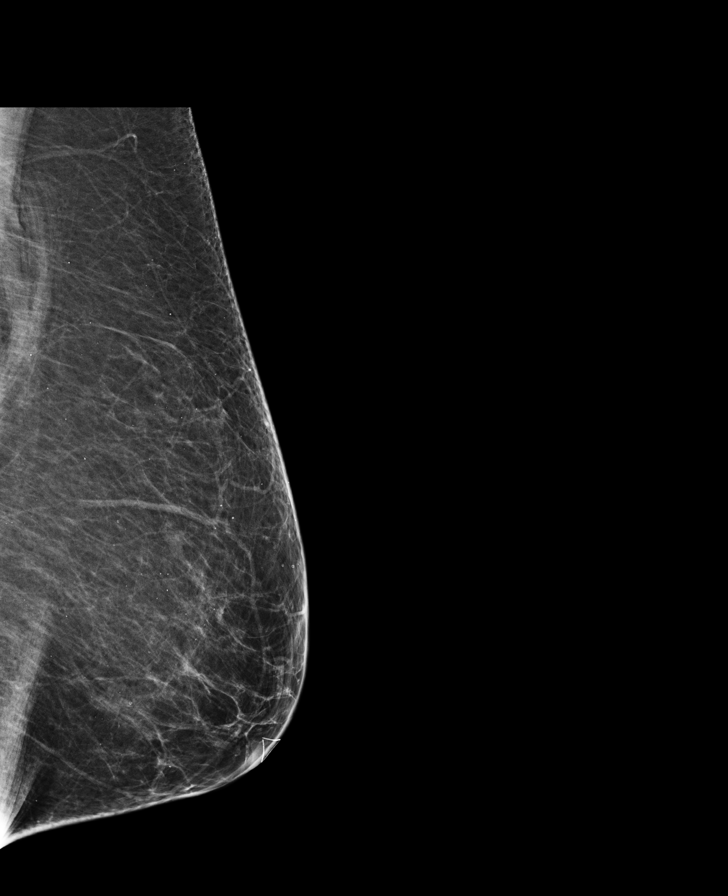

[R CC tomo · tomo slice 30/59.0]
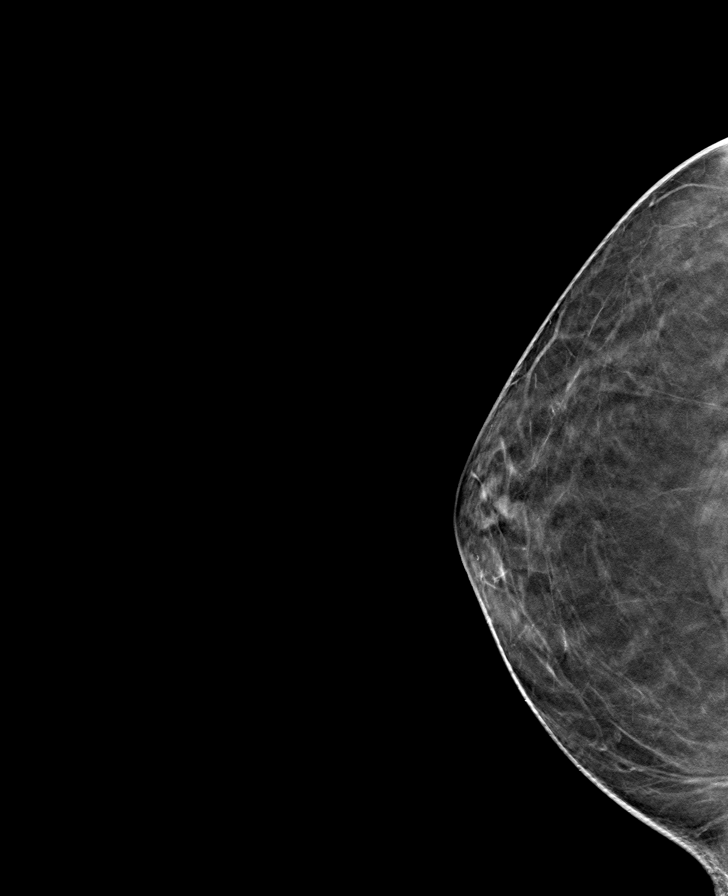

[R MLO tomo · tomo slice 32/63.0]
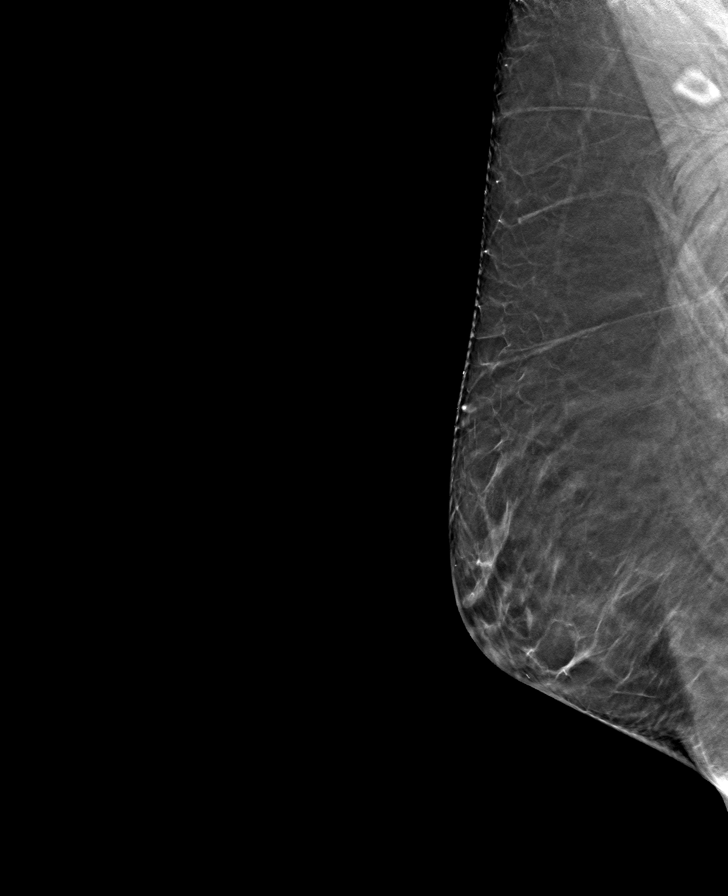

[L MLO tomo · tomo slice 31/60.0]
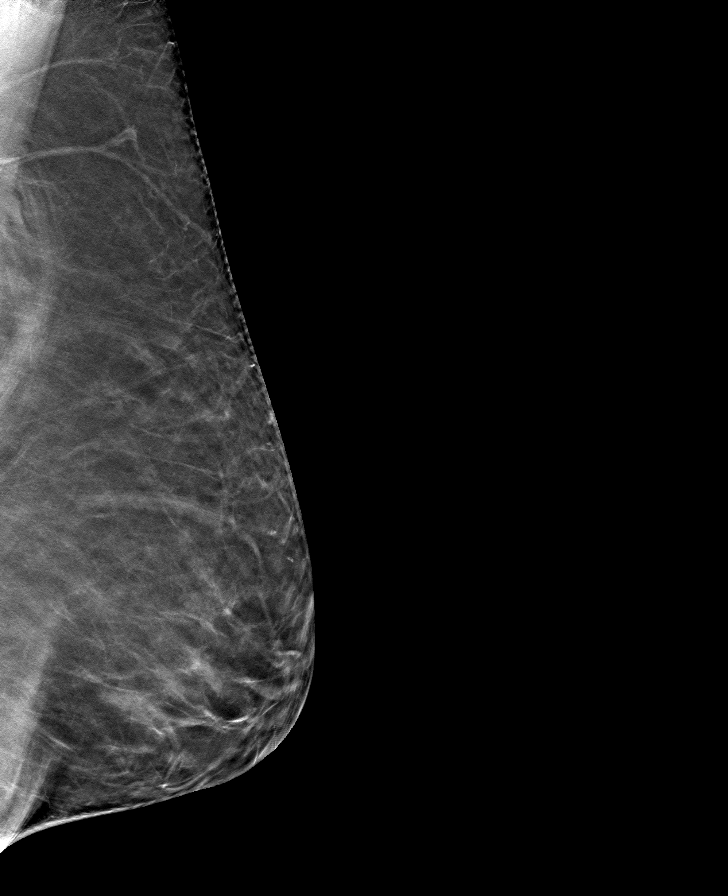

[L CC tomo · tomo slice 29/56.0]
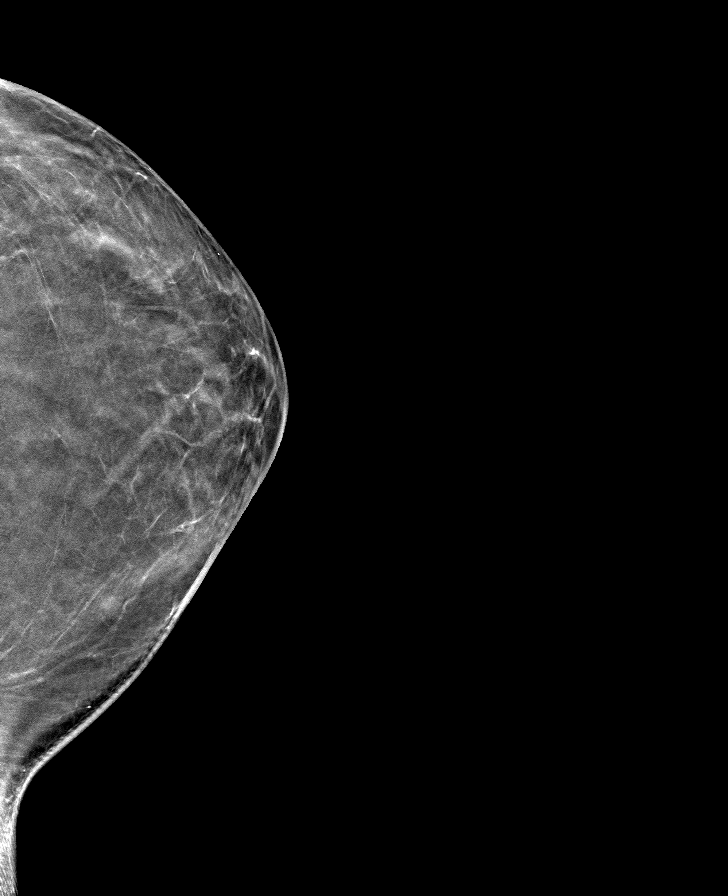

[8 of 24 positions shown; findings below may reference images not displayed]

FINDINGS: LEFT DIAGNOSTIC MAMMOGRAM:  The breast is almost entirely fatty. No significant interval change. No suspicious finding is seen in either breast.

TARGETED LEFT BREAST ULTRASOUND: No sonographic abnormality was seen in the palpable area of concern in the retroareolar left breast.
IMPRESSION: There is no mammographic or targeted sonographic evidence of malignancy. A one year screening mammogram is recommended.  

No mammographic or sonographic correlate to the palpable area of concern in the left breast which likely represents normal tissue. Clinical follow-up recommended.

The patient received a copy of the results at the end of the examination. 

FINAL ASSESSMENT: BI-RADS: Category 1 Negative

## 2020-05-26 IMAGING — MR MRI BRAIN WO CONTRAST
10 series · 48 of 48 positions shown · non-contrast
Comparison: None.

HISTORY: Essential tremor. Parkinson's disease.
TECHNIQUE: Multiplanar, multisequential MRI images of the brain are obtained without intravenous contrast.

[Series 1: bSSFP · axial · 8.0mm · 1.17mm/px · 1 of 19 slices shown]
[im 1/19]
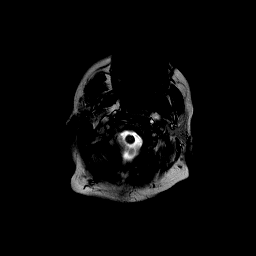

[Series 2: t1_mprage_axial · axial · 1.0mm · 1.00mm/px · z∈[-59,+98]mm · 11 of 160 slices shown]
[im 1/160]
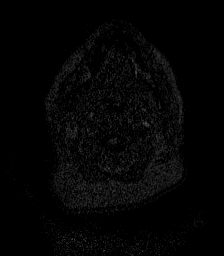
[im 16/160]
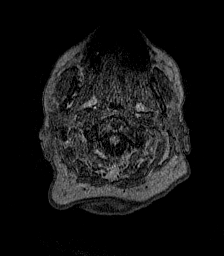
[im 32/160]
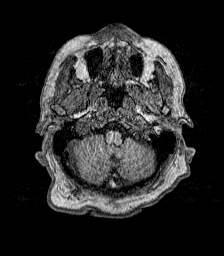
[im 48/160]
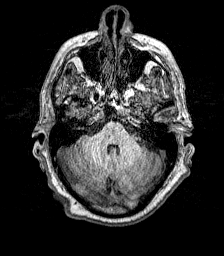
[im 64/160]
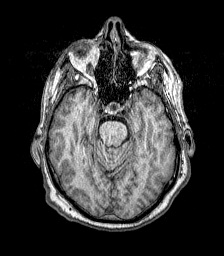
[im 80/160]
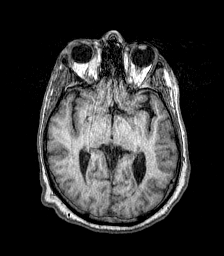
[im 96/160]
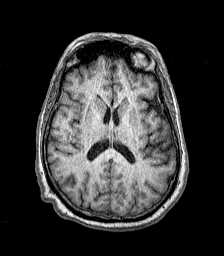
[im 112/160]
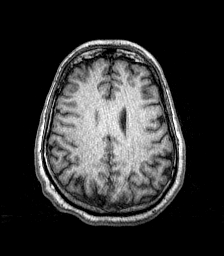
[im 128/160]
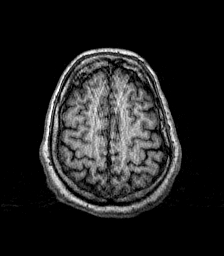
[im 144/160]
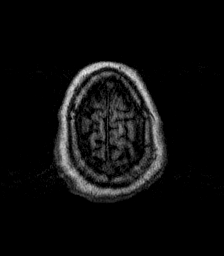
[im 160/160]
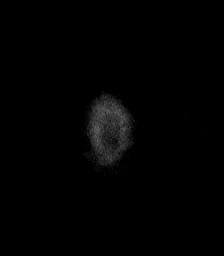

[Series 3: flair_axial_fs · axial · 5.0mm · 0.45mm/px · z∈[-52,+91]mm · 2 of 24 slices shown]
[im 1/24]
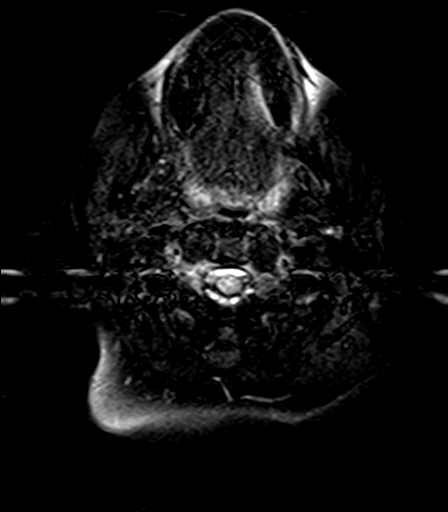
[im 24/24]
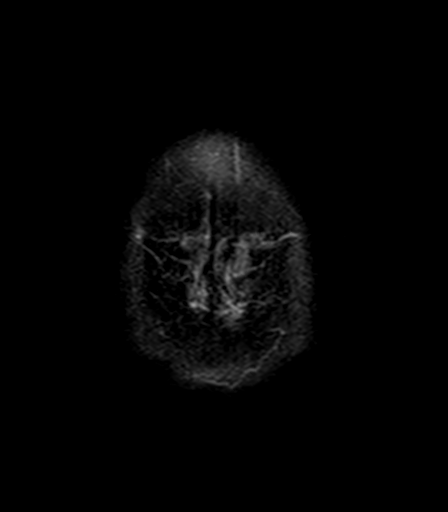

[Series 4: t1_mprage_cor_reformat · coronal · 1.5mm · 0.82mm/px · 12 of 169 slices shown]
[im 1/169]
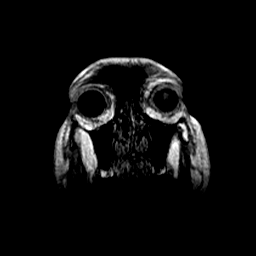
[im 16/169]
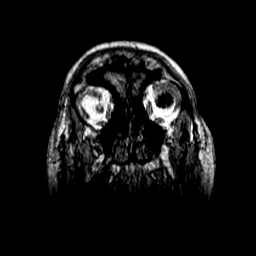
[im 31/169]
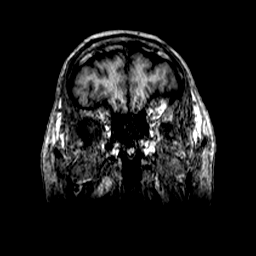
[im 46/169]
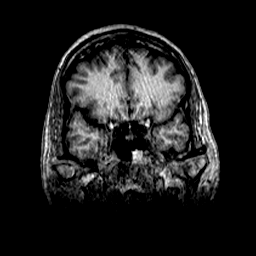
[im 62/169]
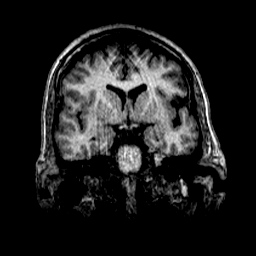
[im 77/169]
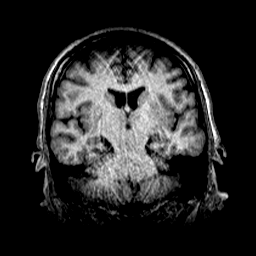
[im 92/169]
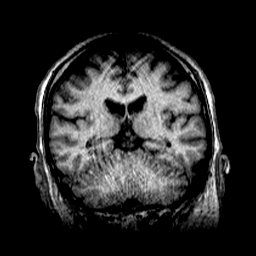
[im 107/169]
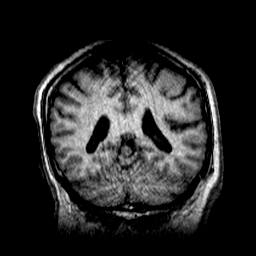
[im 123/169]
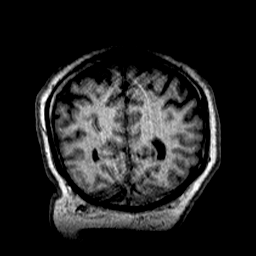
[im 138/169]
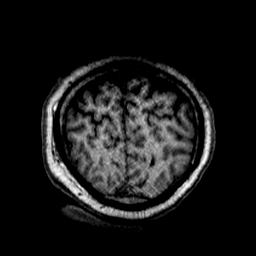
[im 153/169]
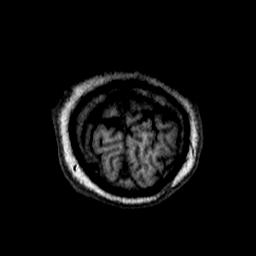
[im 169/169]
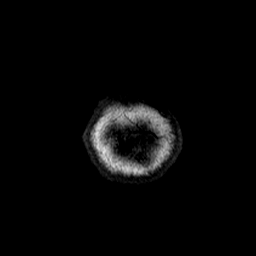

[Series 5: t1_mprage_sag_reformat · sagittal · 1.5mm · 0.87mm/px · 12 of 162 slices shown]
[im 1/162]
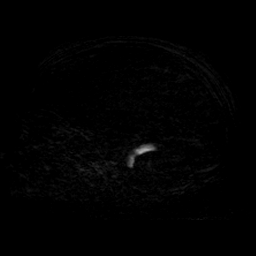
[im 15/162]
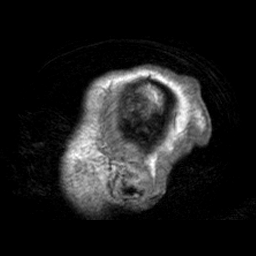
[im 30/162]
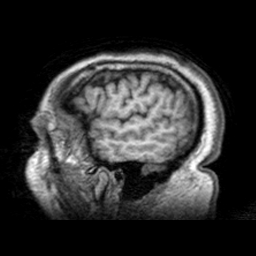
[im 44/162]
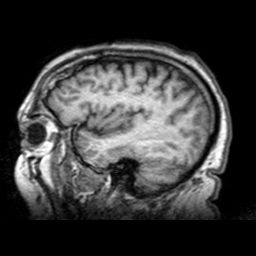
[im 59/162]
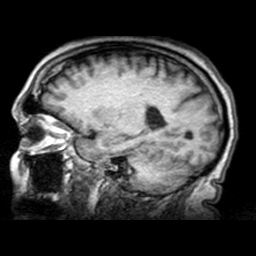
[im 74/162]
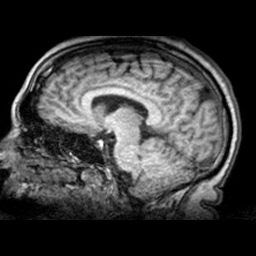
[im 88/162]
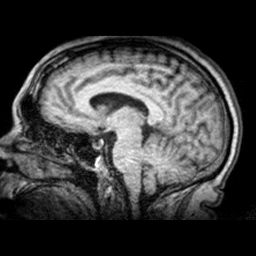
[im 103/162]
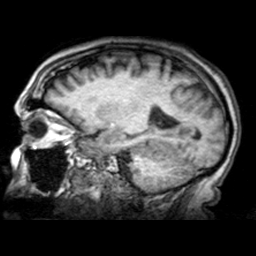
[im 118/162]
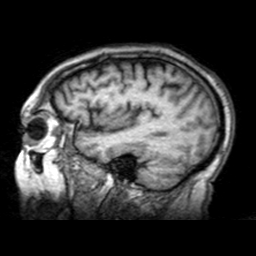
[im 132/162]
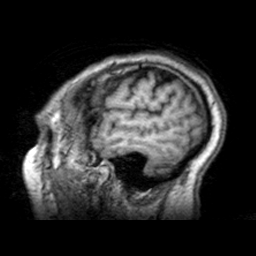
[im 147/162]
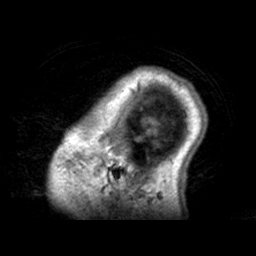
[im 162/162]
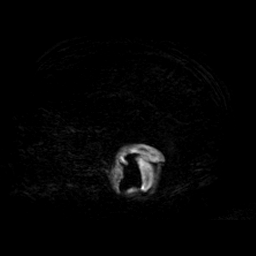

[Series 6: t2_axial · axial · 5.0mm · 0.72mm/px · z∈[-52,+91]mm · 2 of 24 slices shown]
[im 1/24]
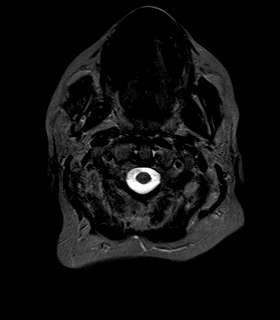
[im 24/24]
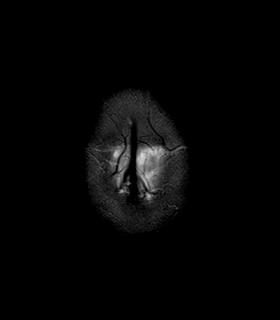

[Series 7: DWI · axial · 5.0mm · 1.26mm/px · z∈[-47,+89]mm · 2 of 23 slices shown (1 of 2)]
[im 1/23]
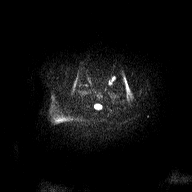
[im 23/23]
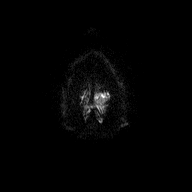

[Series 8: DWI · axial · 5.0mm · 1.26mm/px · z∈[-53,+89]mm · 2 of 24 slices shown (2 of 2)]
[im 1/24]
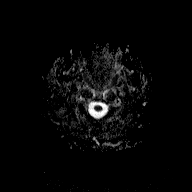
[im 24/24]
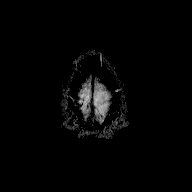

[Series 9: flash_axial · axial · 5.0mm · 0.45mm/px · z∈[-52,+91]mm · 2 of 24 slices shown]
[im 1/24]
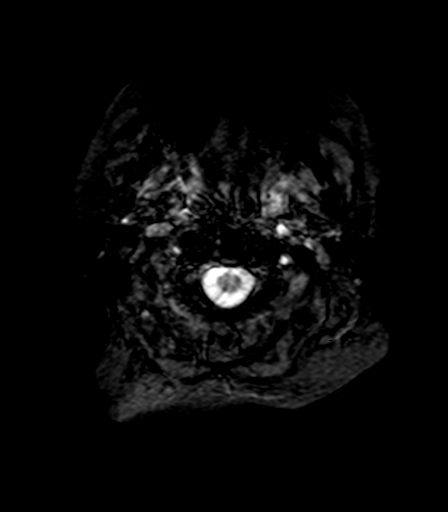
[im 24/24]
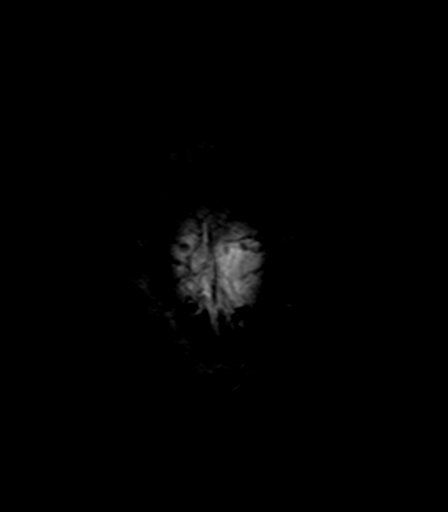

[Series 10: t1_cor · coronal · 5.0mm · 0.72mm/px · 2 of 25 slices shown]
[im 1/25]
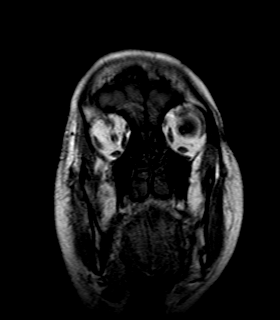
[im 25/25]
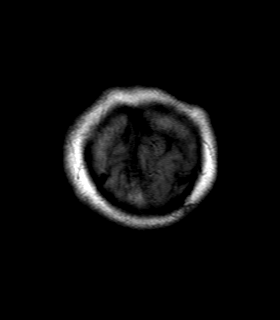

[48 of 48 positions shown; findings below may reference images not displayed]

FINDINGS: Motion artifact is seen on some images.

There is prominence of ventricles and cortical sulci. Periventricular and subcortical FLAIR and T2-hyperintensity foci are seen. Partially empty sella is seen. Midline structures are otherwise normal. There is no mass lesion or midline shift. No extra-axial fluid collection identified. No area of restricted diffusion or hemosiderin deposit is seen.

Mucosal thickening of ethmoid air cells and maxillary sinuses identified. The rest of the orbits, paranasal sinuses, mastoid air cells and skull marrow signal are normal.
IMPRESSION: Motion-limited evaluation.

Mild cerebral atrophy or involutional changes with mild chronic small vessel ischemic changes identified.

Chronic paranasal sinusitis is seen.

No other acute disease.

## 2020-10-25 IMAGING — CR CHEST 2 VWS PA LAT
2 series · 2 of 2 positions shown · non-contrast
Comparison: None

HISTORY/INDICATIONS:  [id number] confirmed by laboratory testing
TECHNIQUE: Chest 2 views.

[w chest pa]
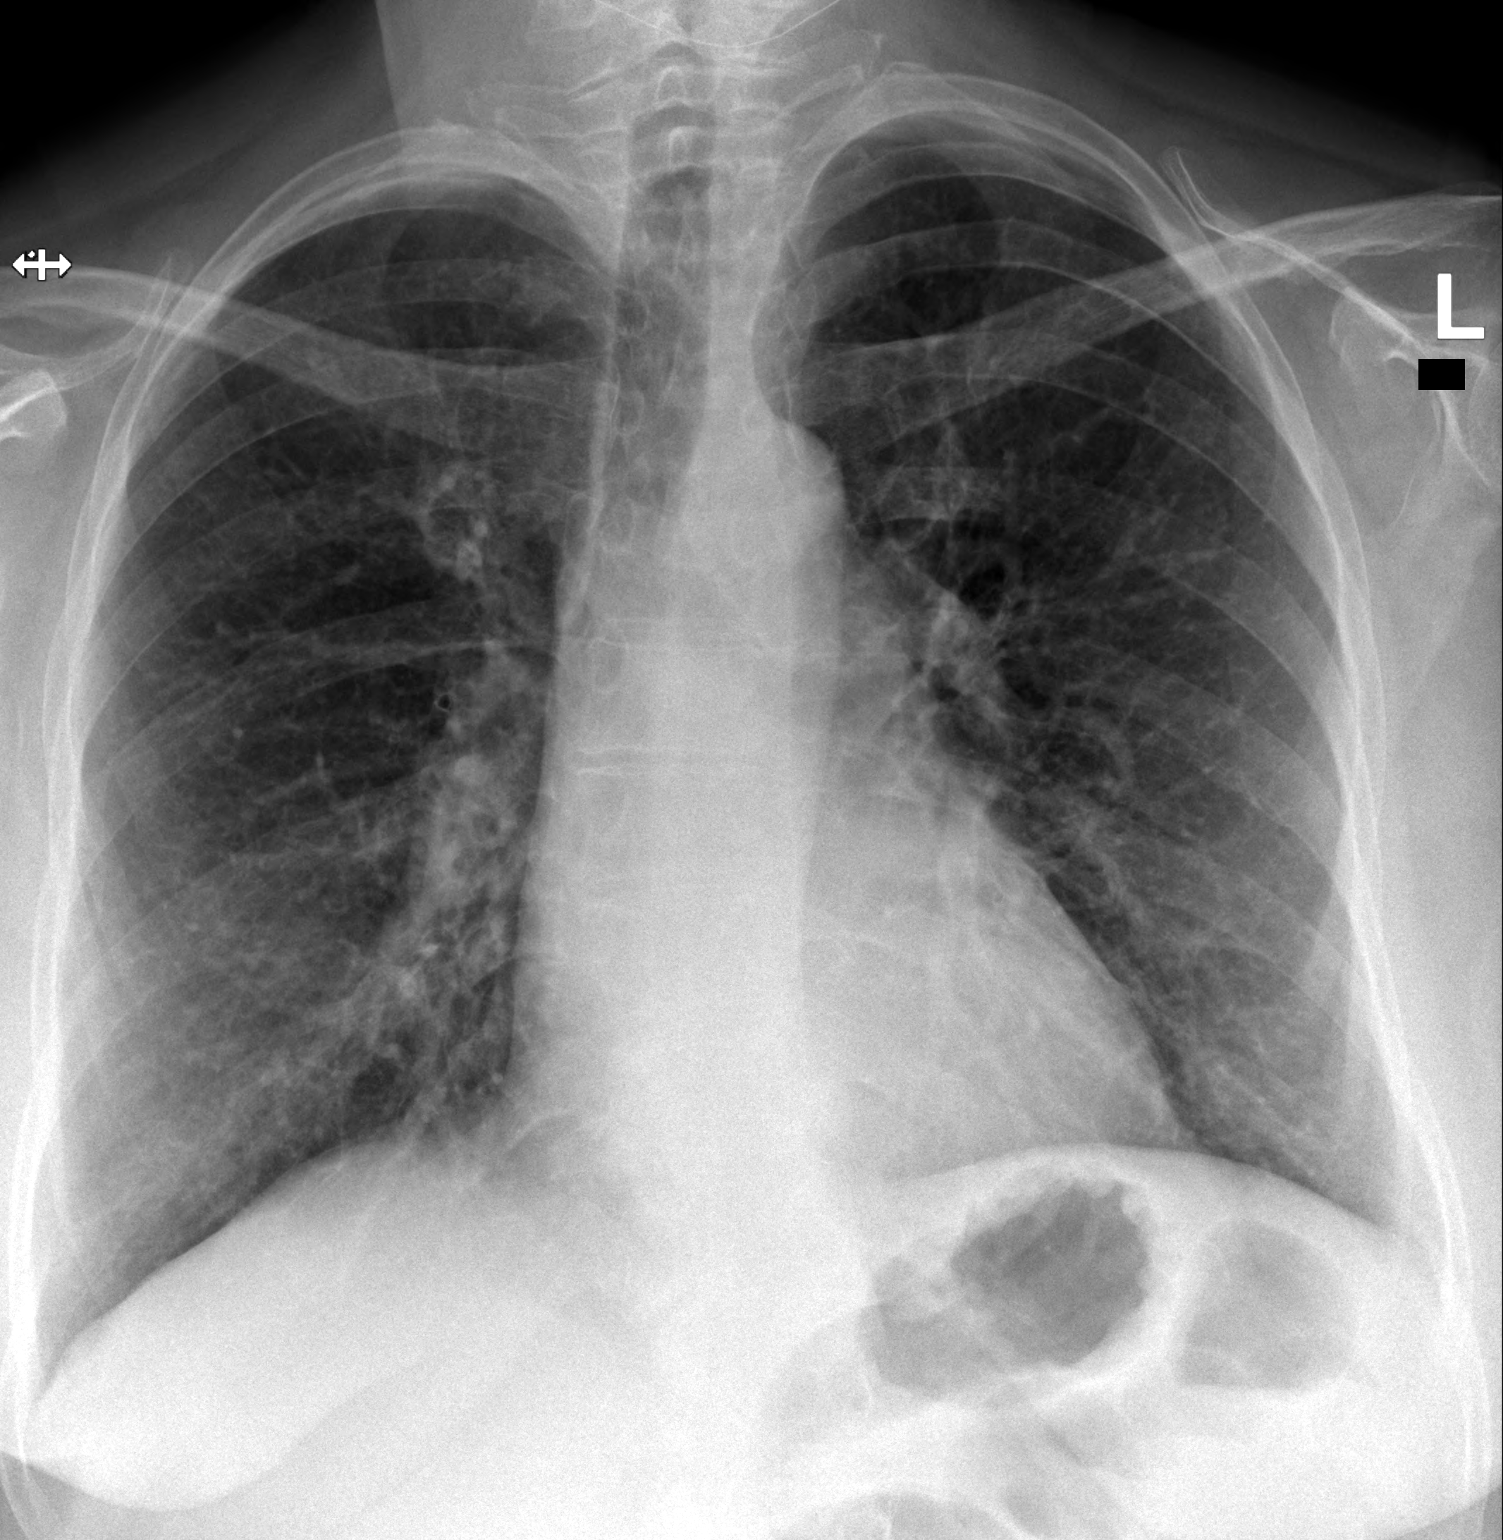

[w chest lat]
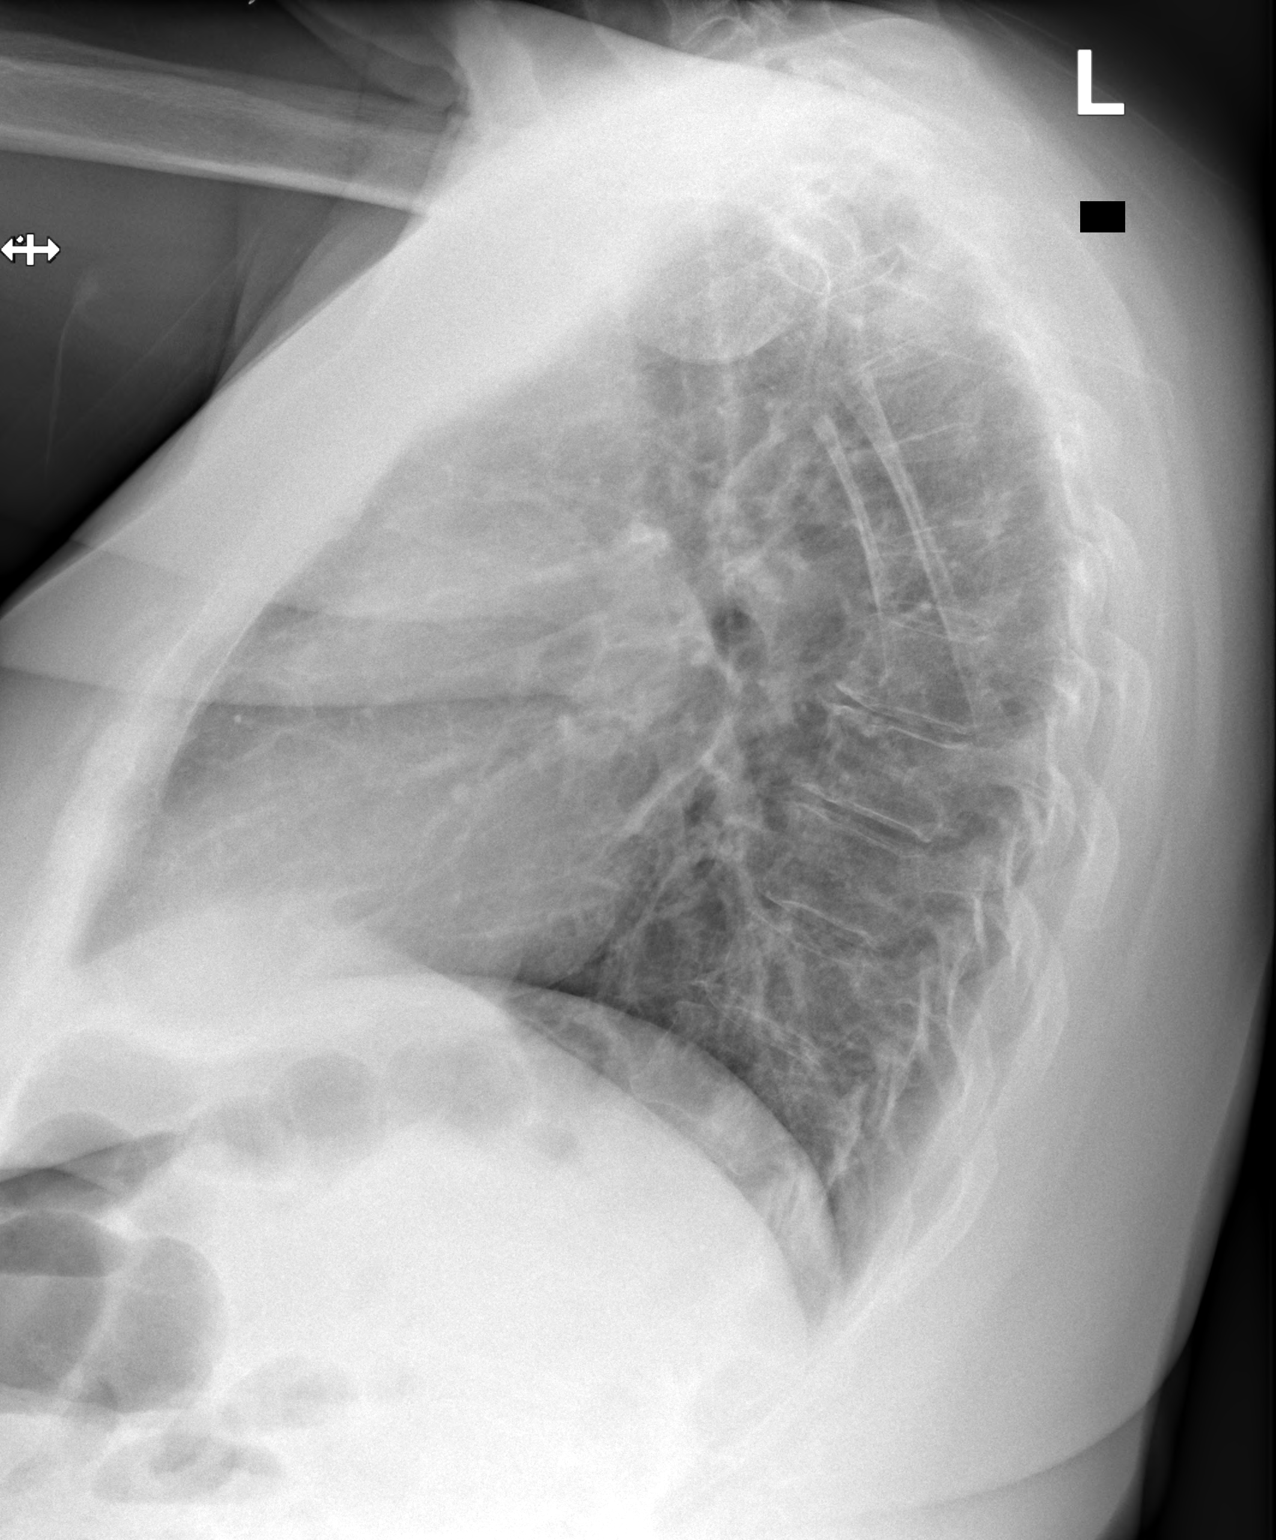

[2 of 2 positions shown; findings below may reference images not displayed]

FINDINGS: Cardiac size and pulmonary vascularity are normal.  The mediastinal silhouette is unremarkable. There are mild bilateral coarse and fine interstitial opacities that are likely minimal chronic interstitial disease. No alveolar consolidation or effusion.
IMPRESSION: No acute disease.

## 2021-01-24 IMAGING — CR KNEE RT 3 VWS
3 series · 3 of 3 positions shown · non-contrast
Comparison: None

Right knee radiographs, 3 views
INDICATION: Right knee pain

[t knee ap right]
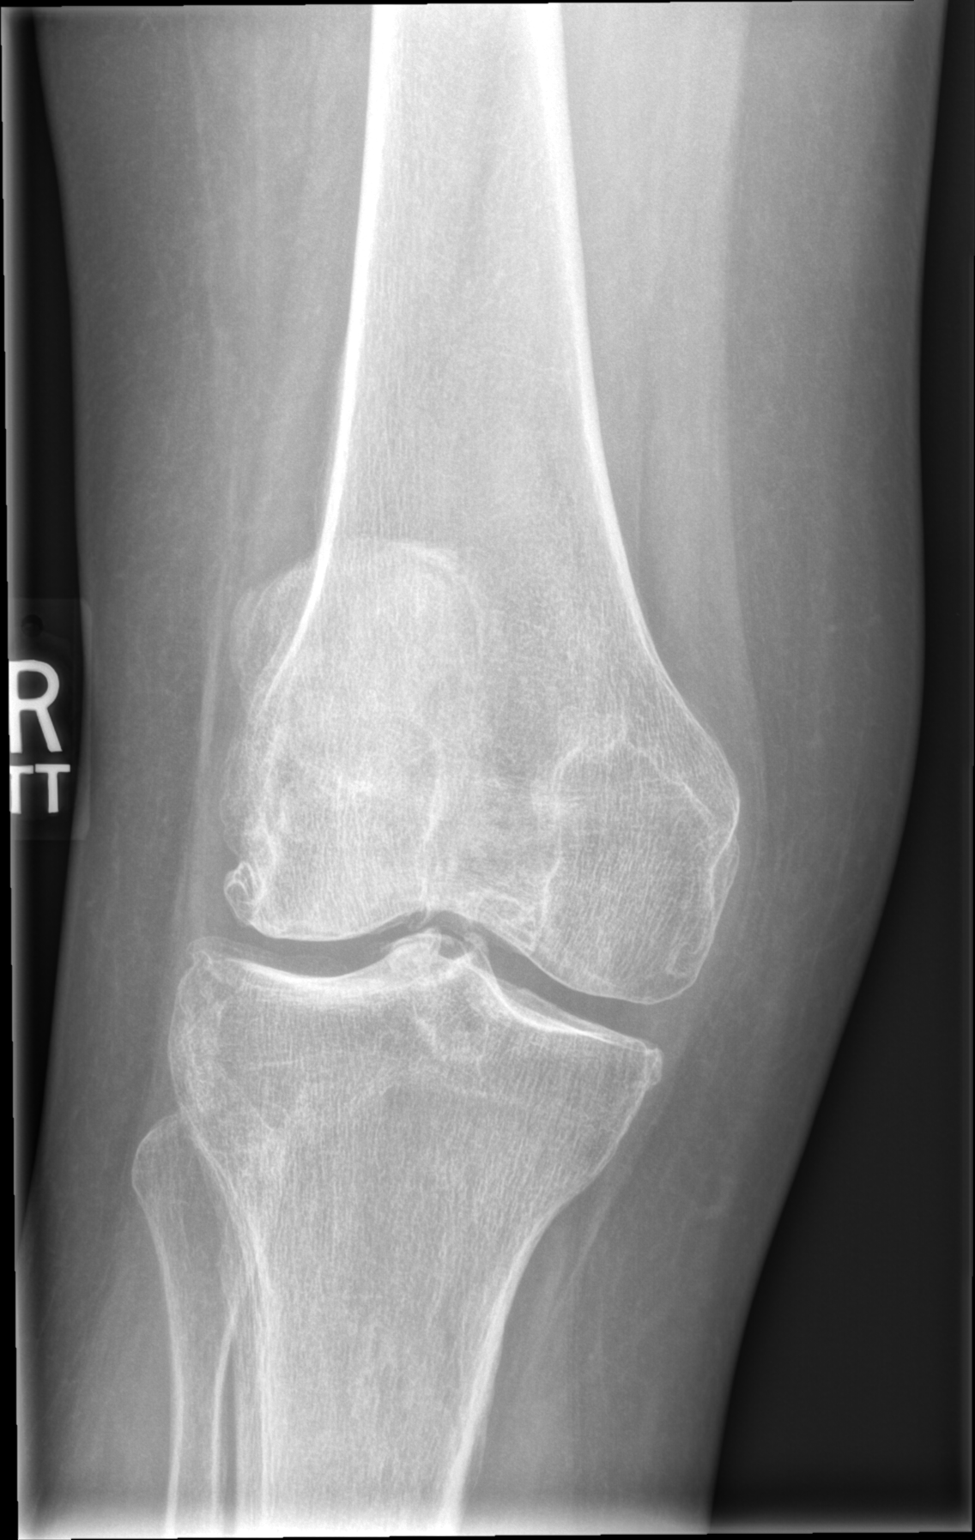

[t knee lat right]
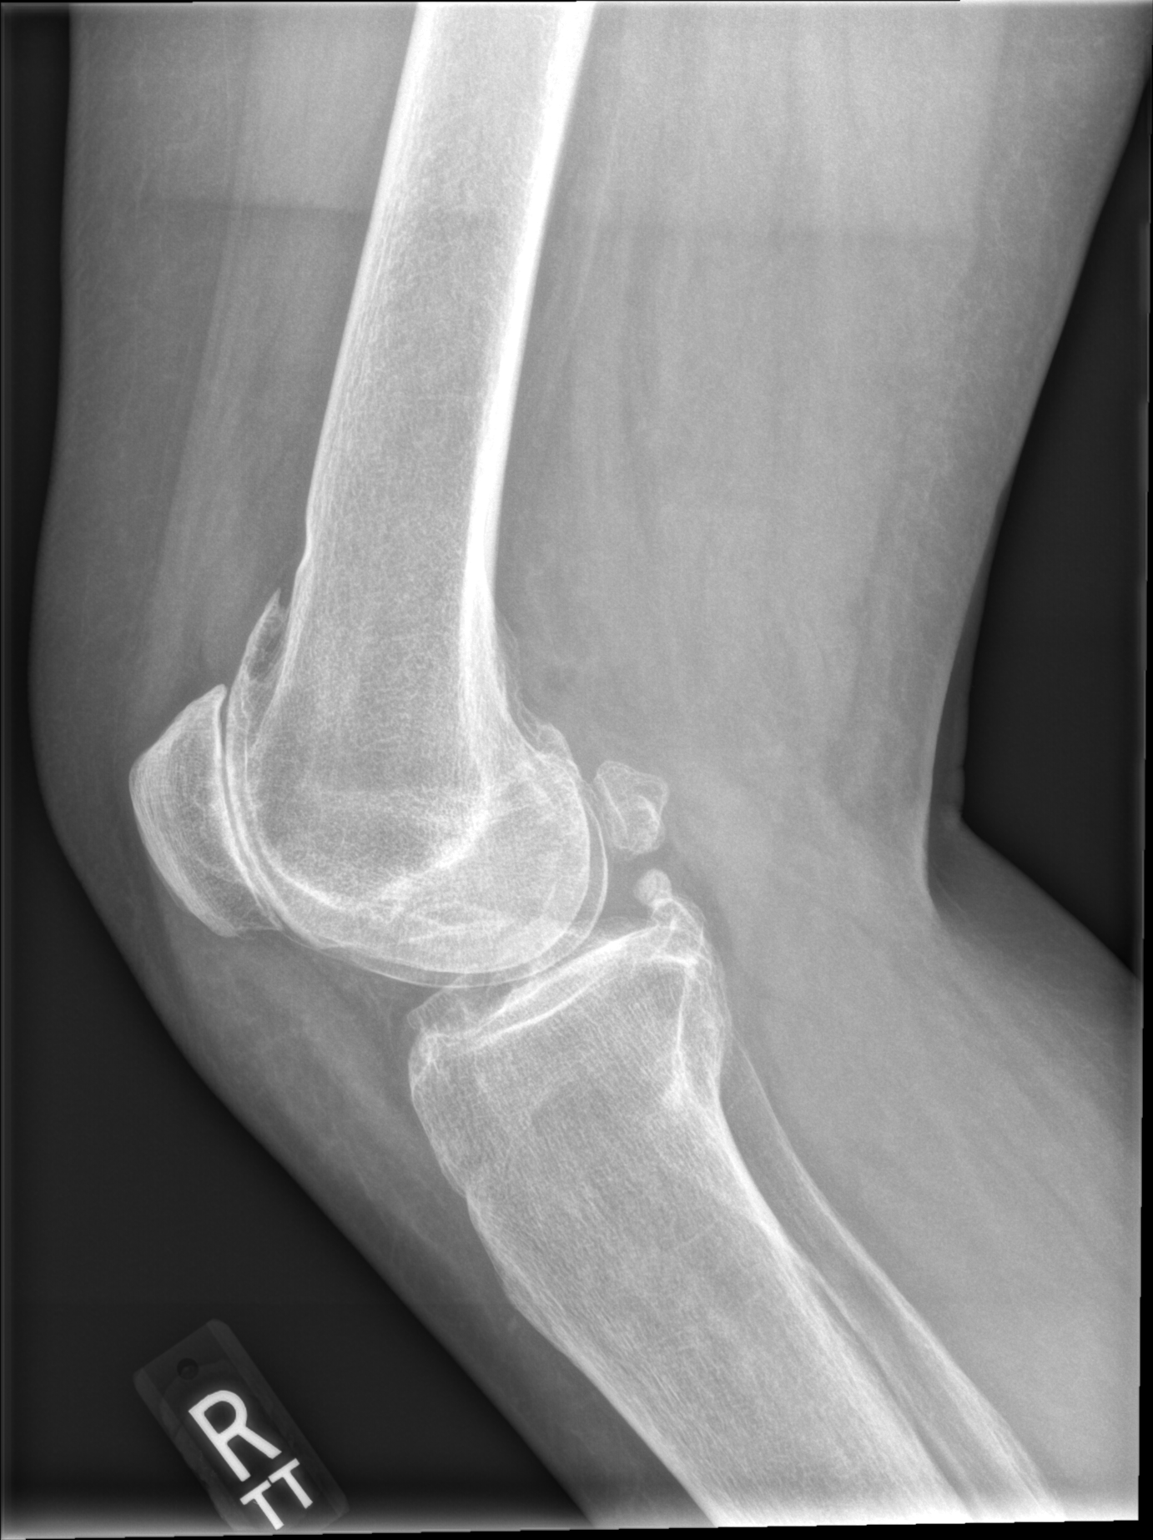

[x knee sunrise right]
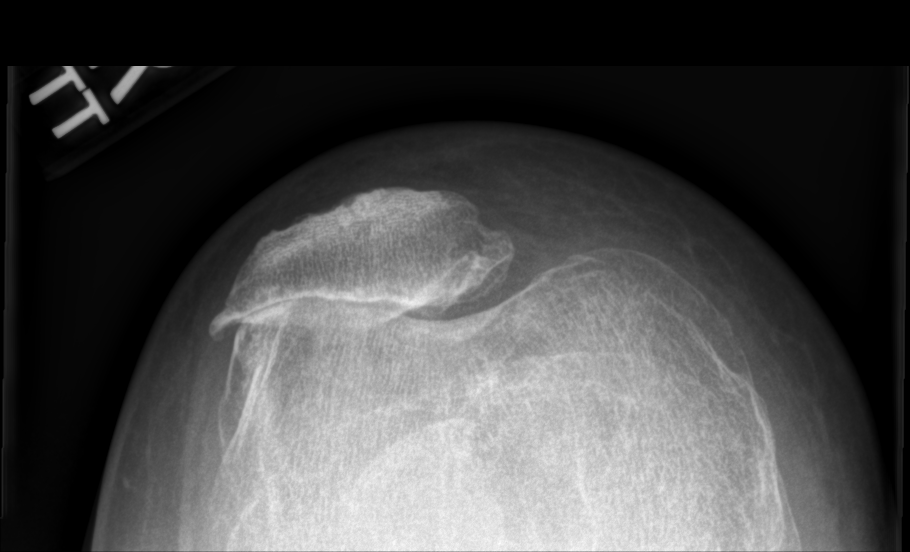

[3 of 3 positions shown; findings below may reference images not displayed]

FINDINGS: No acute fracture or dislocation. Osteopenia. Small joint effusion. Marginal osteophytes. Severe patellofemoral compartment DJD with bone-on-bone apposition. Small ossified loose body in the posterior knee joint. Lateral subluxation of the patella. Mild medial and lateral compartment joint space loss.
IMPRESSION: 1.
Osteopenia with no acute osseous finding.

2.
Severe patellofemoral compartment DJD.

3.
Lateral fixation of the patella.

## 2021-02-01 IMAGING — MR MRI KNEE RT WO CONTRAST
5 series · 40 of 40 positions shown · IV contrast (gadolinium)
Comparison: Right knee radiographs, 01/24/2021.

HISTORY: Pain in right knee.
TECHNIQUE: Multiplanar and multisequence MR imaging of the right knee was performed without the administration of intravenous gadolinium.

[Series 2: t2_axial_fs · axial · 4.0mm · 0.50mm/px · z∈[-45,+69]mm · 8 of 24 slices shown]
[im 1/24]
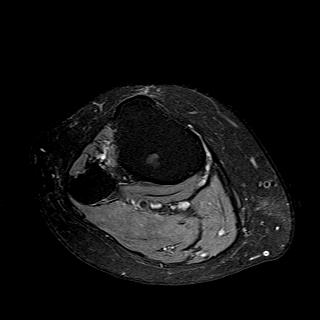
[im 4/24]
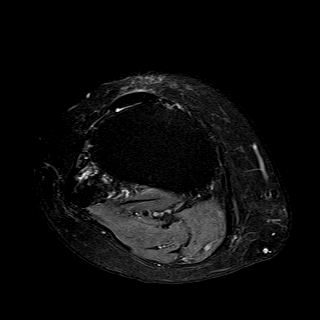
[im 7/24]
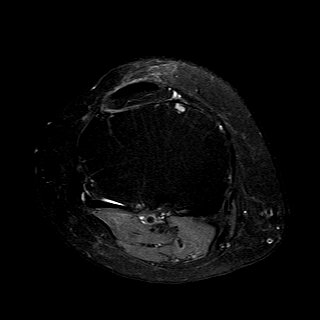
[im 10/24]
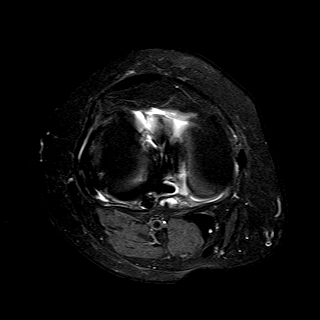
[im 14/24]
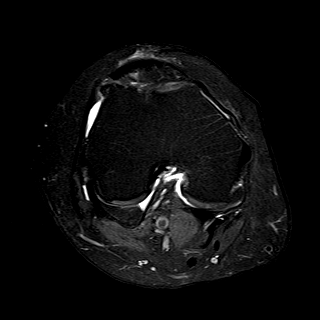
[im 17/24]
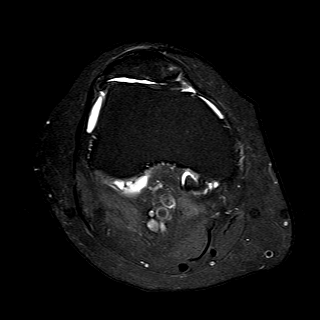
[im 20/24]
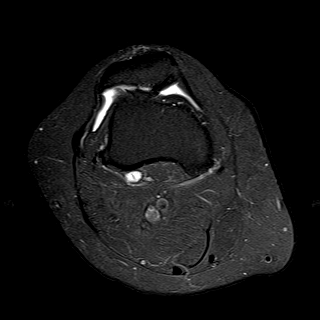
[im 24/24]
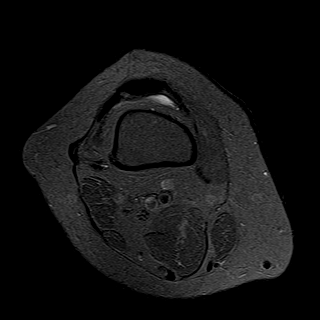

[Series 3: pd_sag_fs · sagittal · 3.0mm · 0.53mm/px · 9 of 28 slices shown]
[im 1/28]
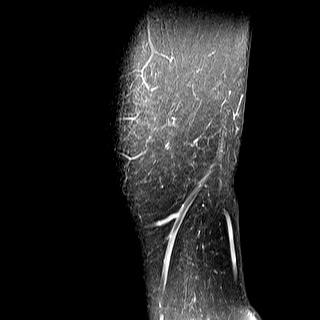
[im 4/28]
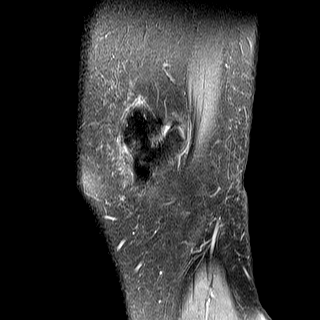
[im 7/28]
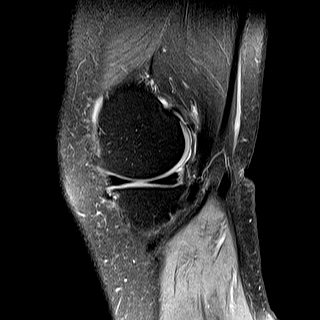
[im 11/28]
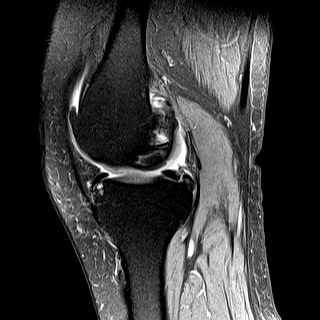
[im 14/28]
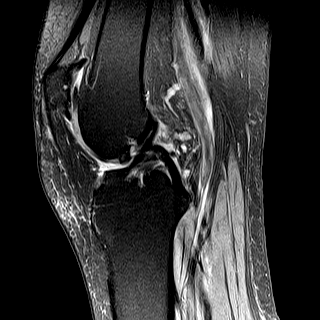
[im 17/28]
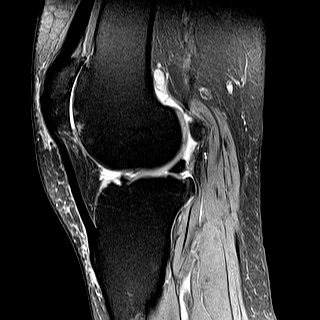
[im 21/28]
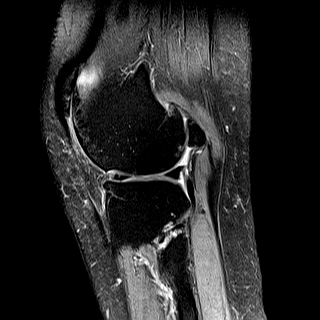
[im 24/28]
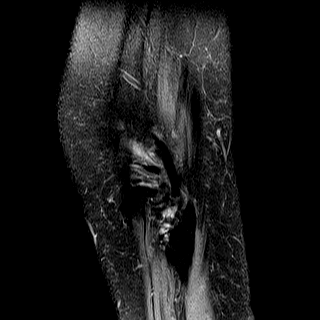
[im 28/28]
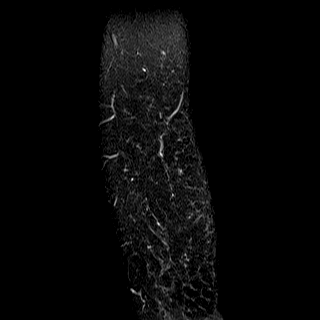

[Series 4: t2_sag_fs · sagittal · 3.0mm · 0.53mm/px · 9 of 28 slices shown]
[im 1/28]
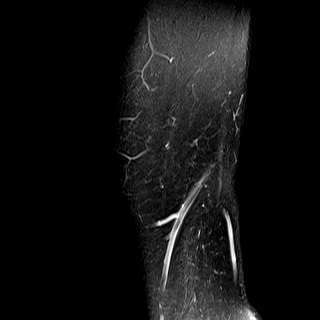
[im 4/28]
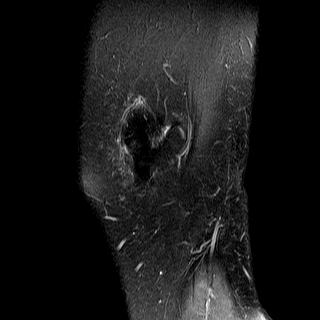
[im 7/28]
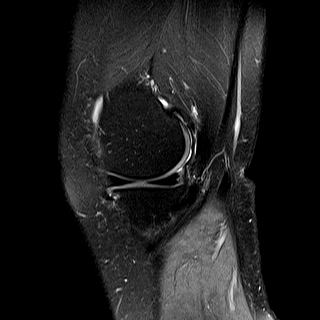
[im 11/28]
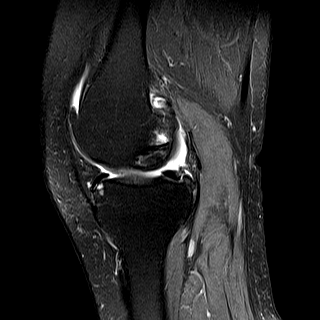
[im 14/28]
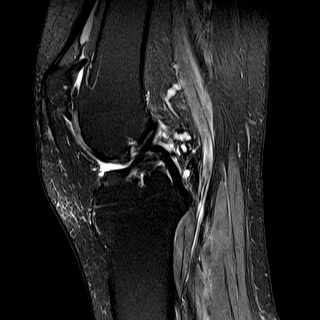
[im 17/28]
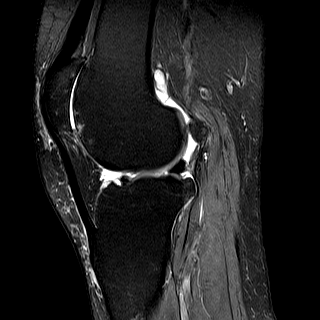
[im 21/28]
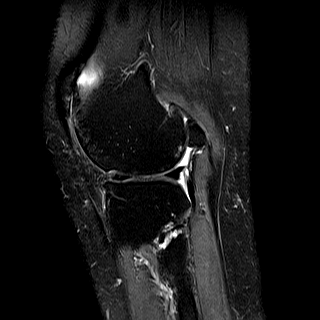
[im 24/28]
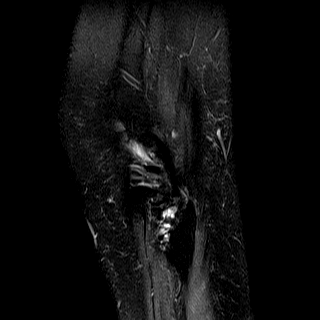
[im 28/28]
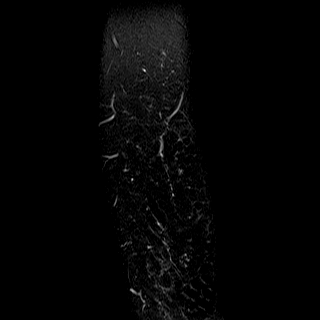

[Series 5: t1_cor · coronal · 4.0mm · 0.53mm/px · 7 of 22 slices shown]
[im 1/22]
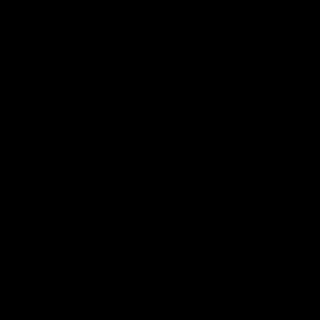
[im 4/22]
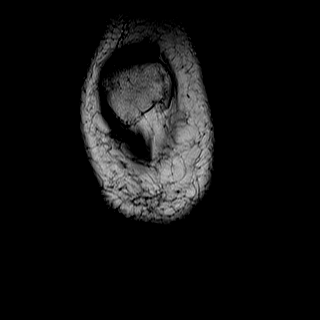
[im 8/22]
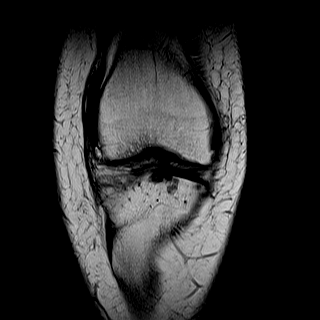
[im 11/22]
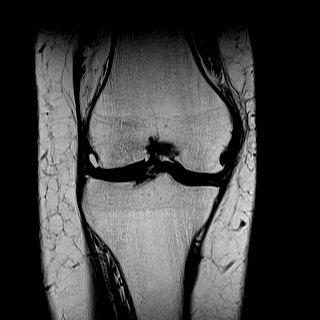
[im 15/22]
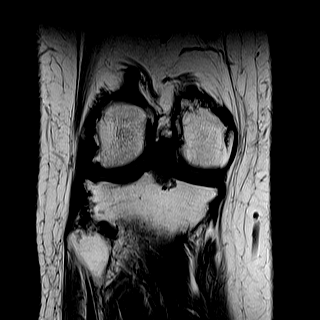
[im 18/22]
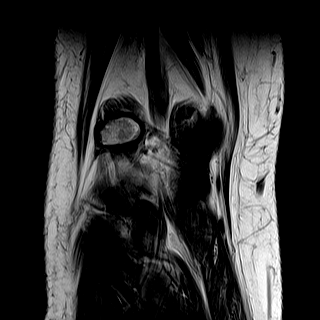
[im 22/22]
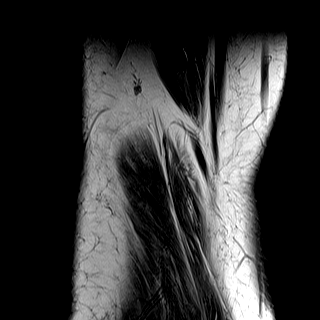

[Series 6: t2_cor_fs · coronal · 4.0mm · 0.53mm/px · 7 of 22 slices shown]
[im 1/22]
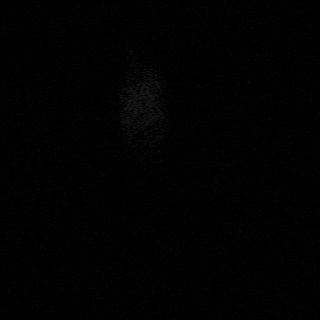
[im 4/22]
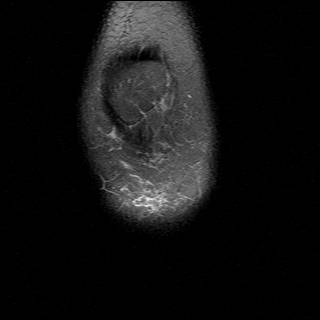
[im 8/22]
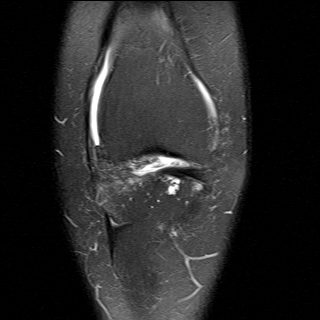
[im 11/22]
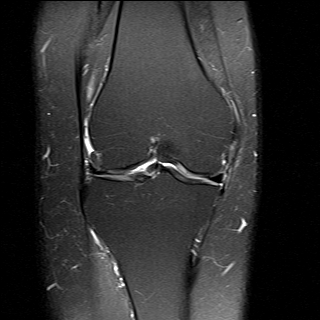
[im 15/22]
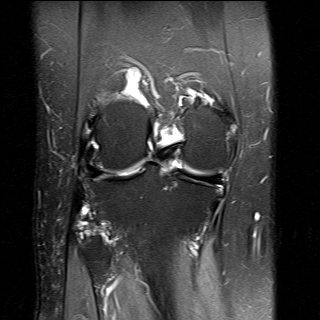
[im 18/22]
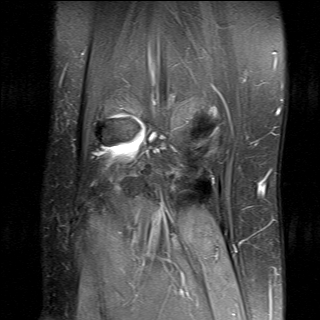
[im 22/22]
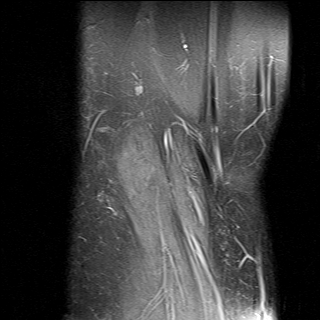

[40 of 40 positions shown; findings below may reference images not displayed]

FINDINGS: The anterior cruciate, posterior cruciate, medial collateral and lateral collateral ligaments are intact.

Small oblique undersurface tear posterior horn medial meniscus. Radial tear anterior horn lateral meniscus which appears mildly truncated.

Small effusion. Intra-articular loose body posterior joint space just posterior to the posterior horn of the lateral meniscus. Approximately 5 mm in size. Sizable loose body or osteophyte intercondylar notch region.

Severe DJD patellofemoral compartment. Complete loss of articular cartilage. Associated reactive subcortical marrow edema.

Mild chondral surface irregularity medial and lateral compartments.

Osseous structures demonstrate no fractures or destructive lesions.

Extensor mechanism intact. Patellar retinacula unremarkable. Signal from muscle normal. Muscle mass normal. No cystic or solid lesions. No visible adenopathy.

Intra-articular fat pads are normal.
IMPRESSION: 1.
Severe DJD patellofemoral compartment with complete loss of articular cartilage and associated reactive subcortical marrow edema.

2.
Small effusion. 5 mm intra-articular loose body posterior joint space. Sizable loose body or osteophyte intercondylar notch region.

3.
Small oblique undersurface tear posterior horn medial meniscus.

4.
Radial tear anterior horn lateral meniscus which appears mildly truncated.

5.
Intact cruciate and collateral ligaments.

## 2022-07-31 IMAGING — MG MAMMO SCRN BIL W/CAD TOMO
6 of 10 series · 6 of 26 positions shown · non-contrast
Comparison: The present examination has been compared to prior imaging studies.

Images Obtained from [facility]
INDICATION: Screening.
TECHNIQUE: Bilateral 2-D digital screening mammogram was performed followed by 3-D tomosynthesis.  Current study was also evaluated with a computer aided detection (CAD) system.
MAMMOGRAM FINDINGS:
The breasts are almost entirely fatty.
No suspicious abnormality is seen in either breast.  There are no significant changes from the prior study.

[R MLO (1 of 2)]
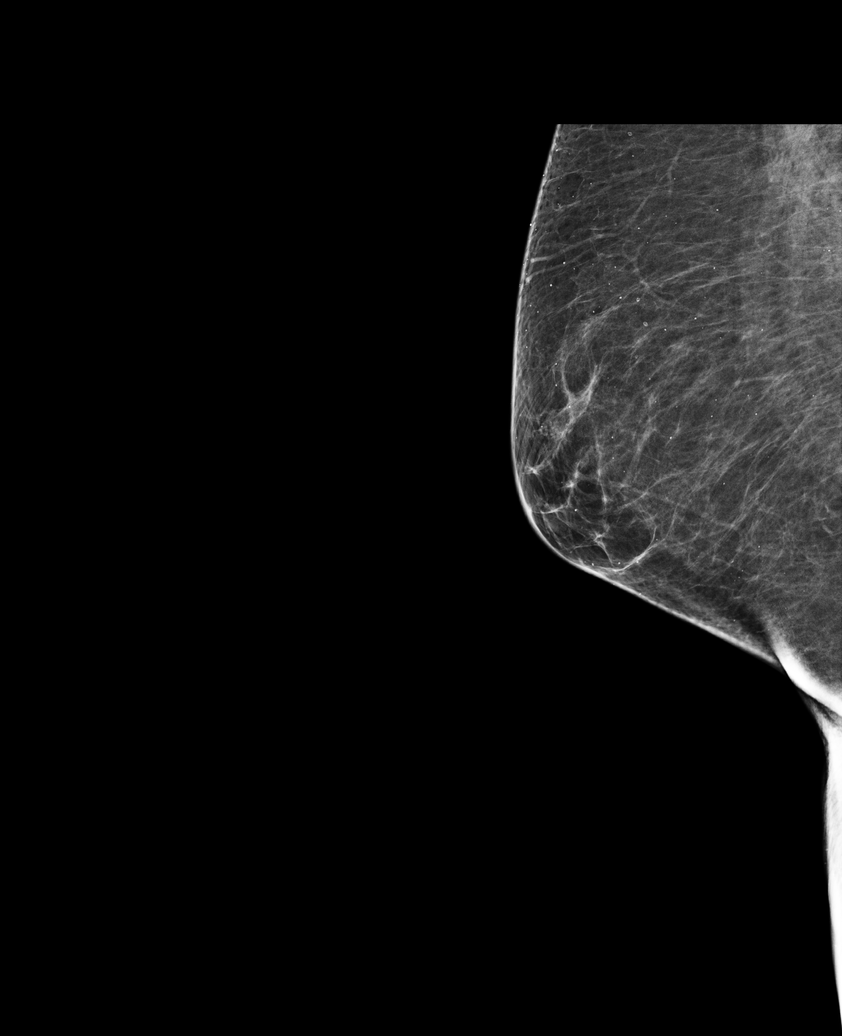

[L MLO (1 of 2)]
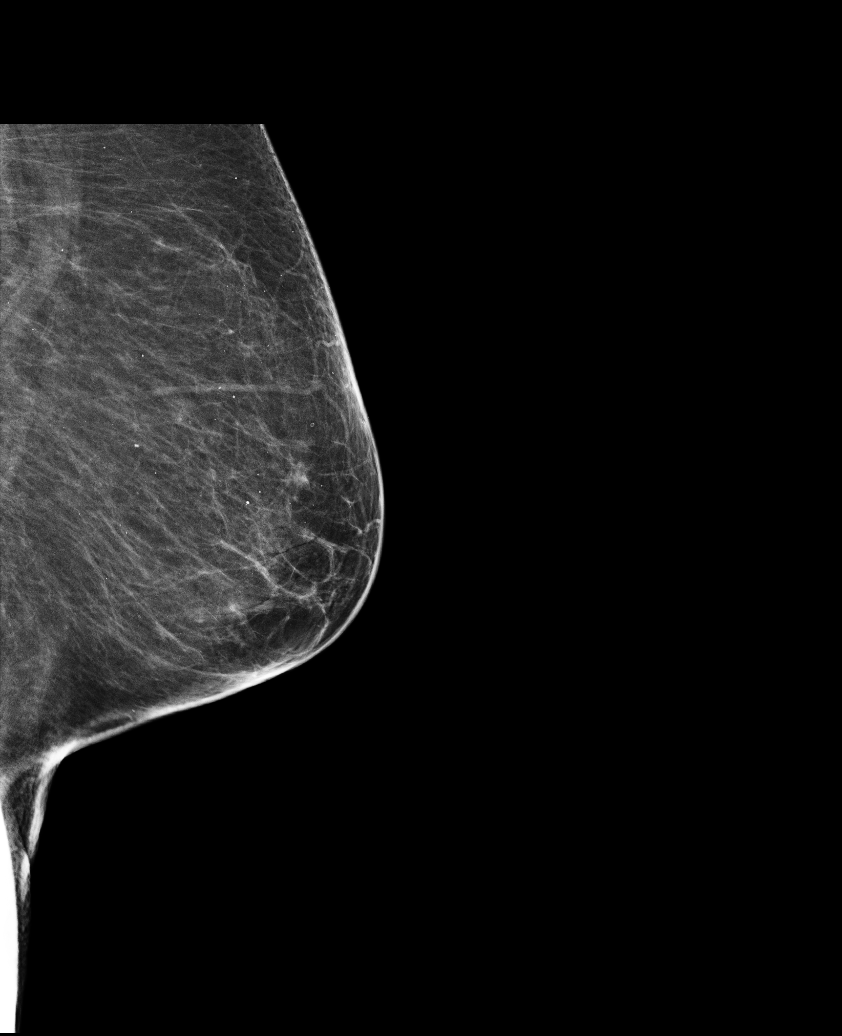

[R CC]
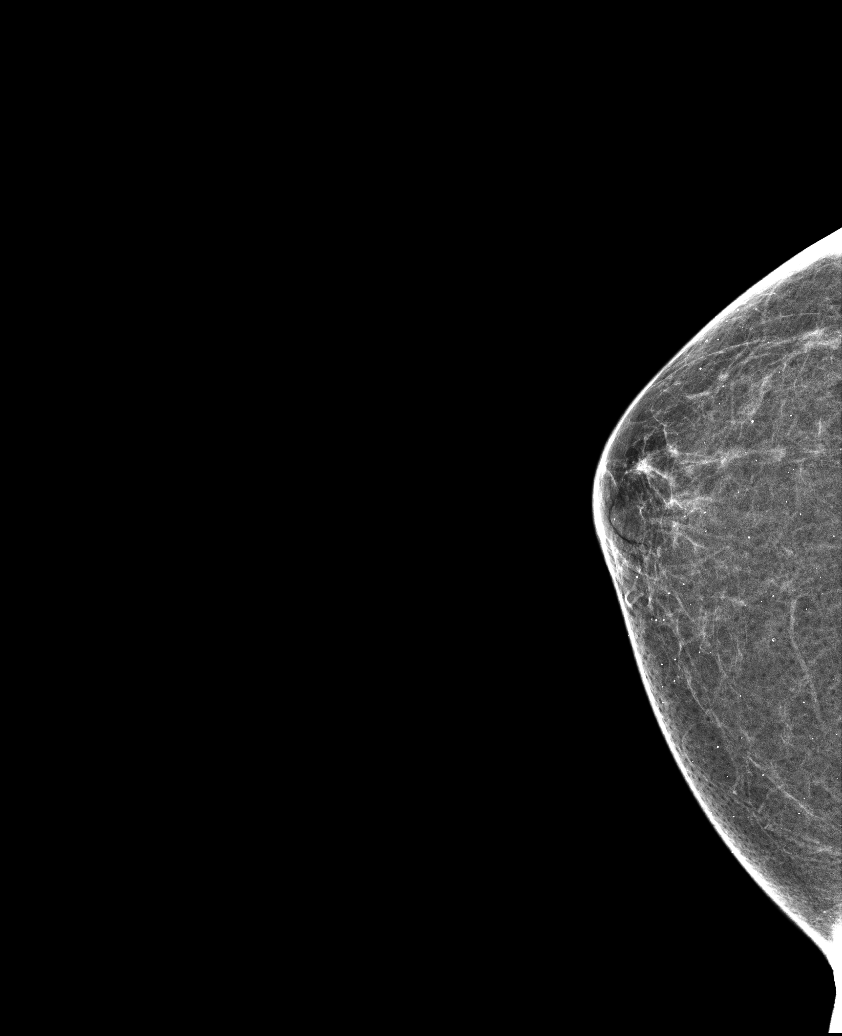

[L MLO (2 of 2)]
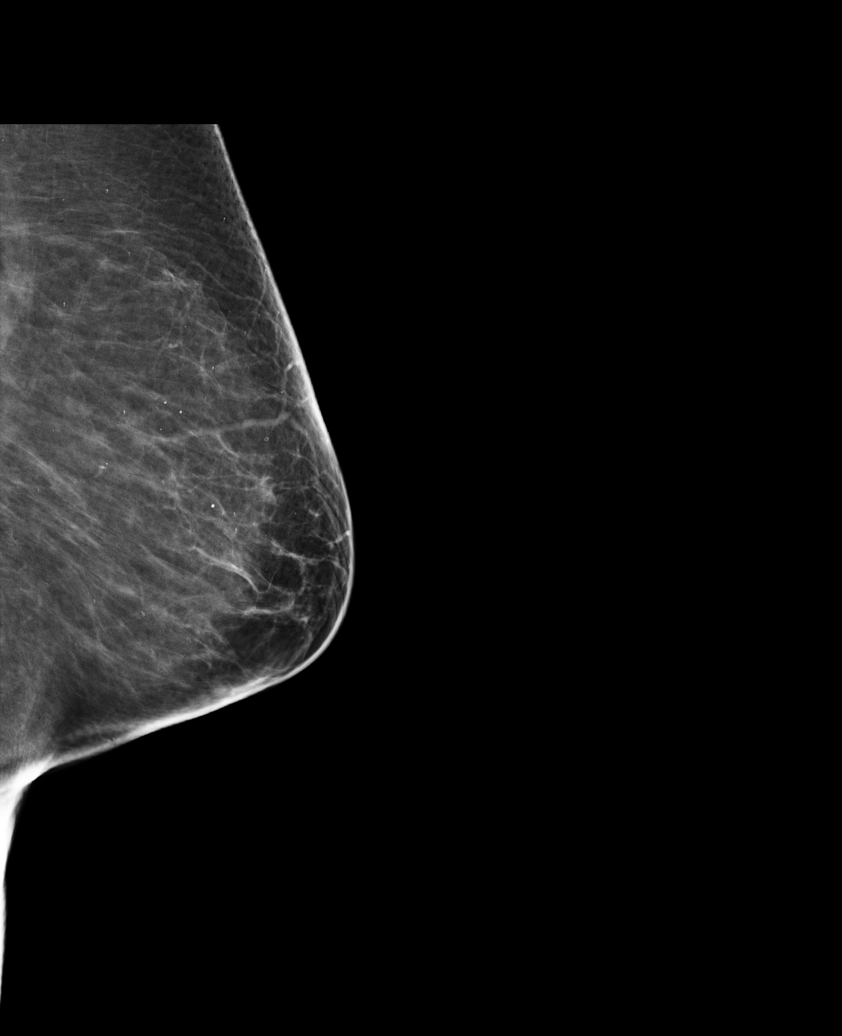

[L CC]
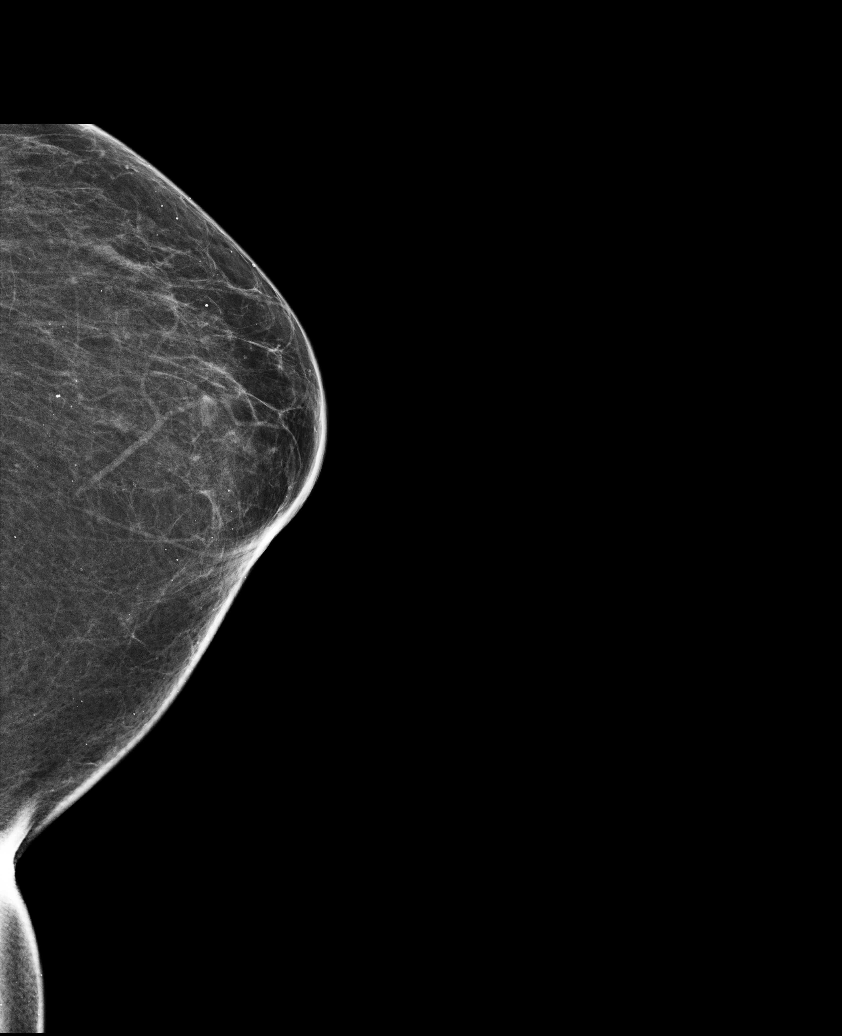

[R MLO (2 of 2)]
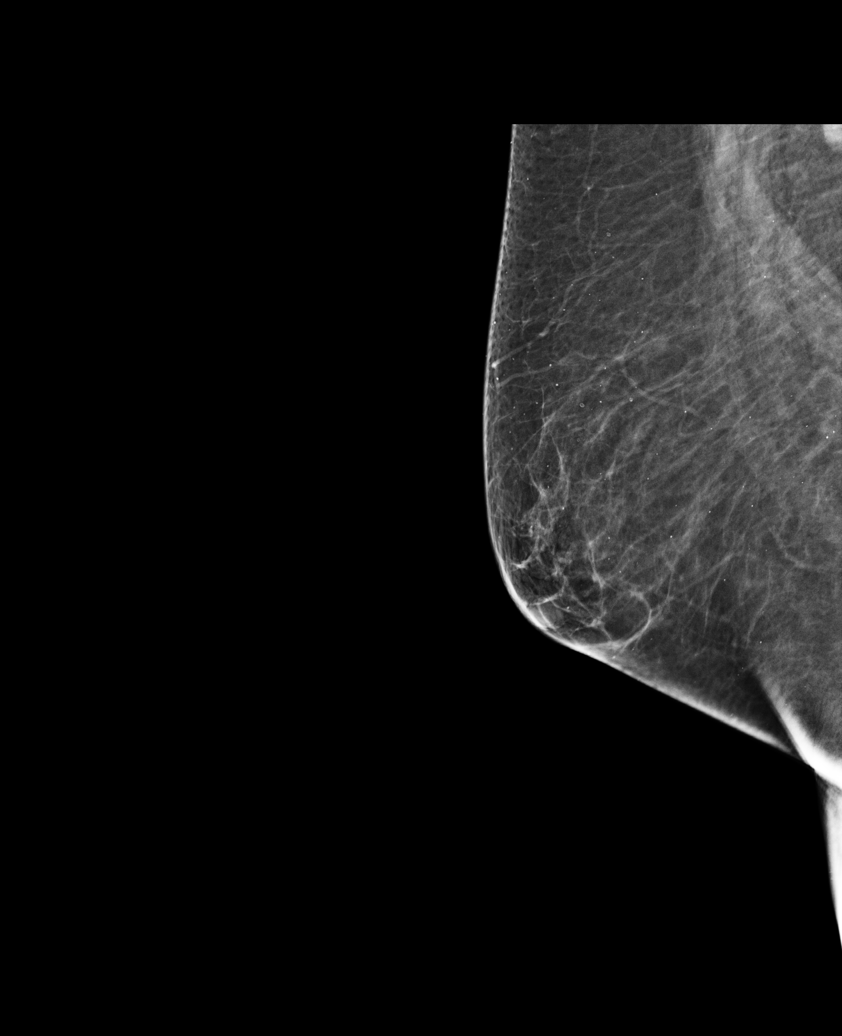

[6 of 26 positions shown; findings below may reference images not displayed]

IMPRESSION: There is no mammographic evidence of malignancy.
Screening mammogram recommended in 1 year.
BI-RADS Category 1: Negative

## 2022-08-01 IMAGING — OT DXA BONE DENSITY
2 series · 2 of 2 positions shown · non-contrast
Comparison: none

Images Obtained from [facility]
REASON FOR EXAM: Screening.
RISK FACTORS:  Glucocorticoid use.
PRIOR EXAMS:  None
METHOD:  Scans of the spine between L1-L4 and hip were performed using dual energy X-ray densitometry (DXA)

[Series 1: — · 1 of 1 slices shown (1 of 2)]
[im 1/1]
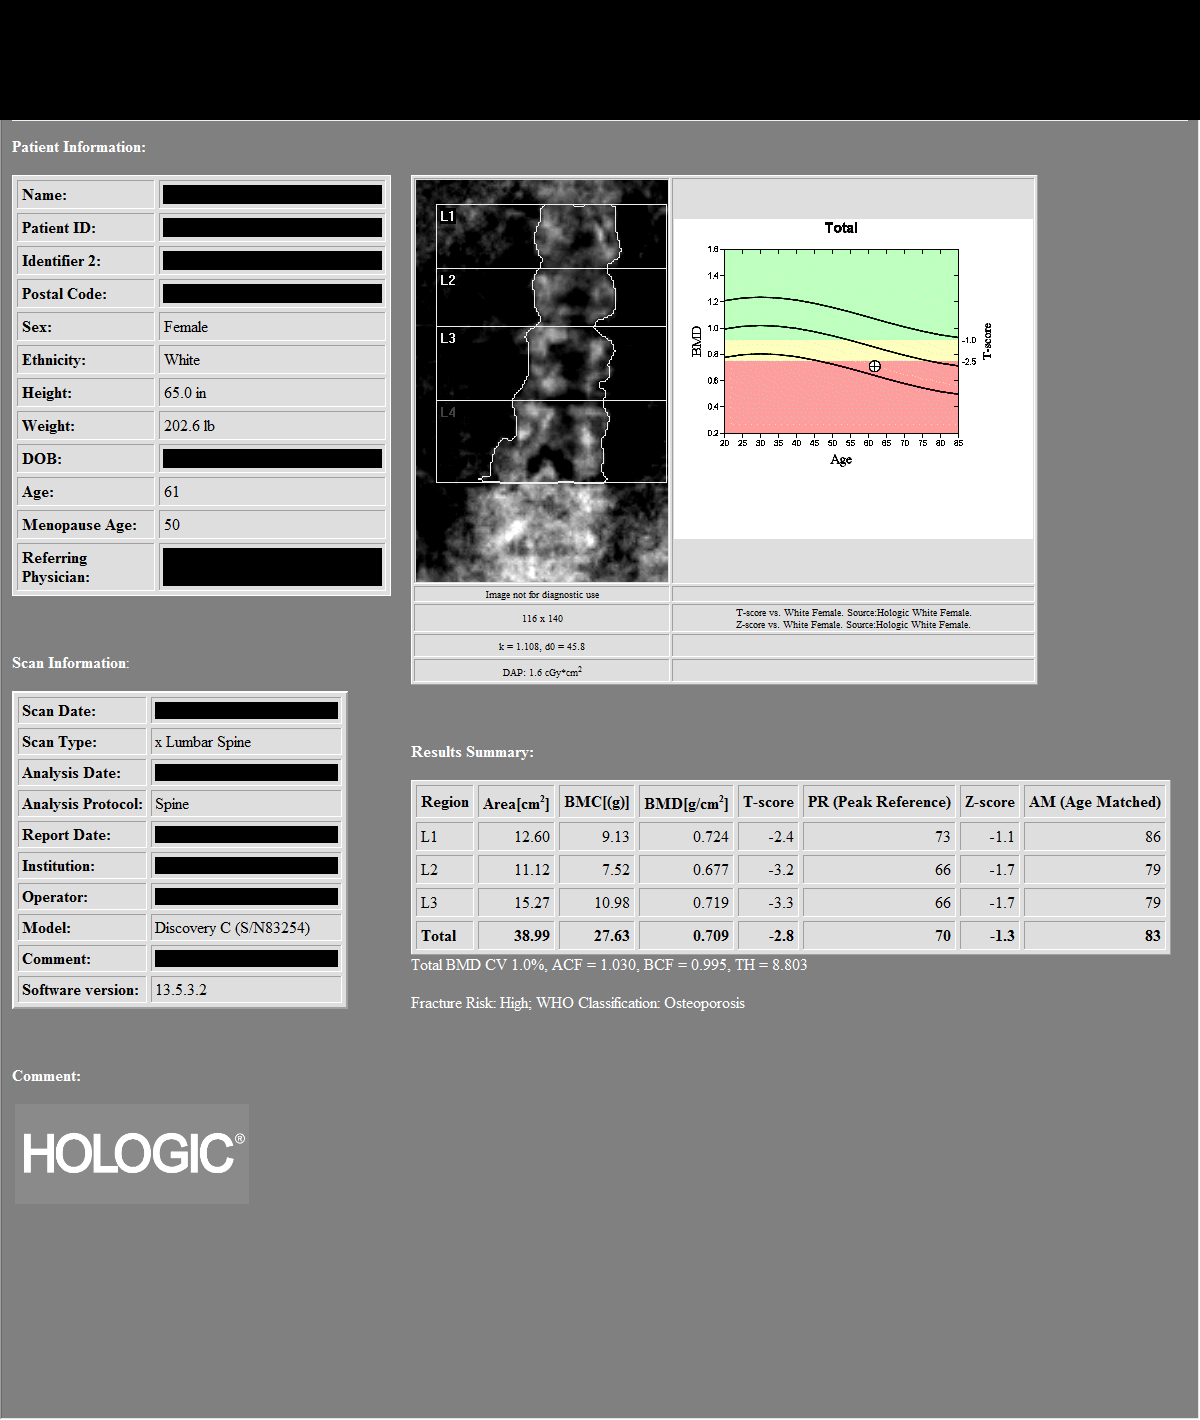

[Series 2: — · left · 1 of 1 slices shown (2 of 2)]
[im 1/1]
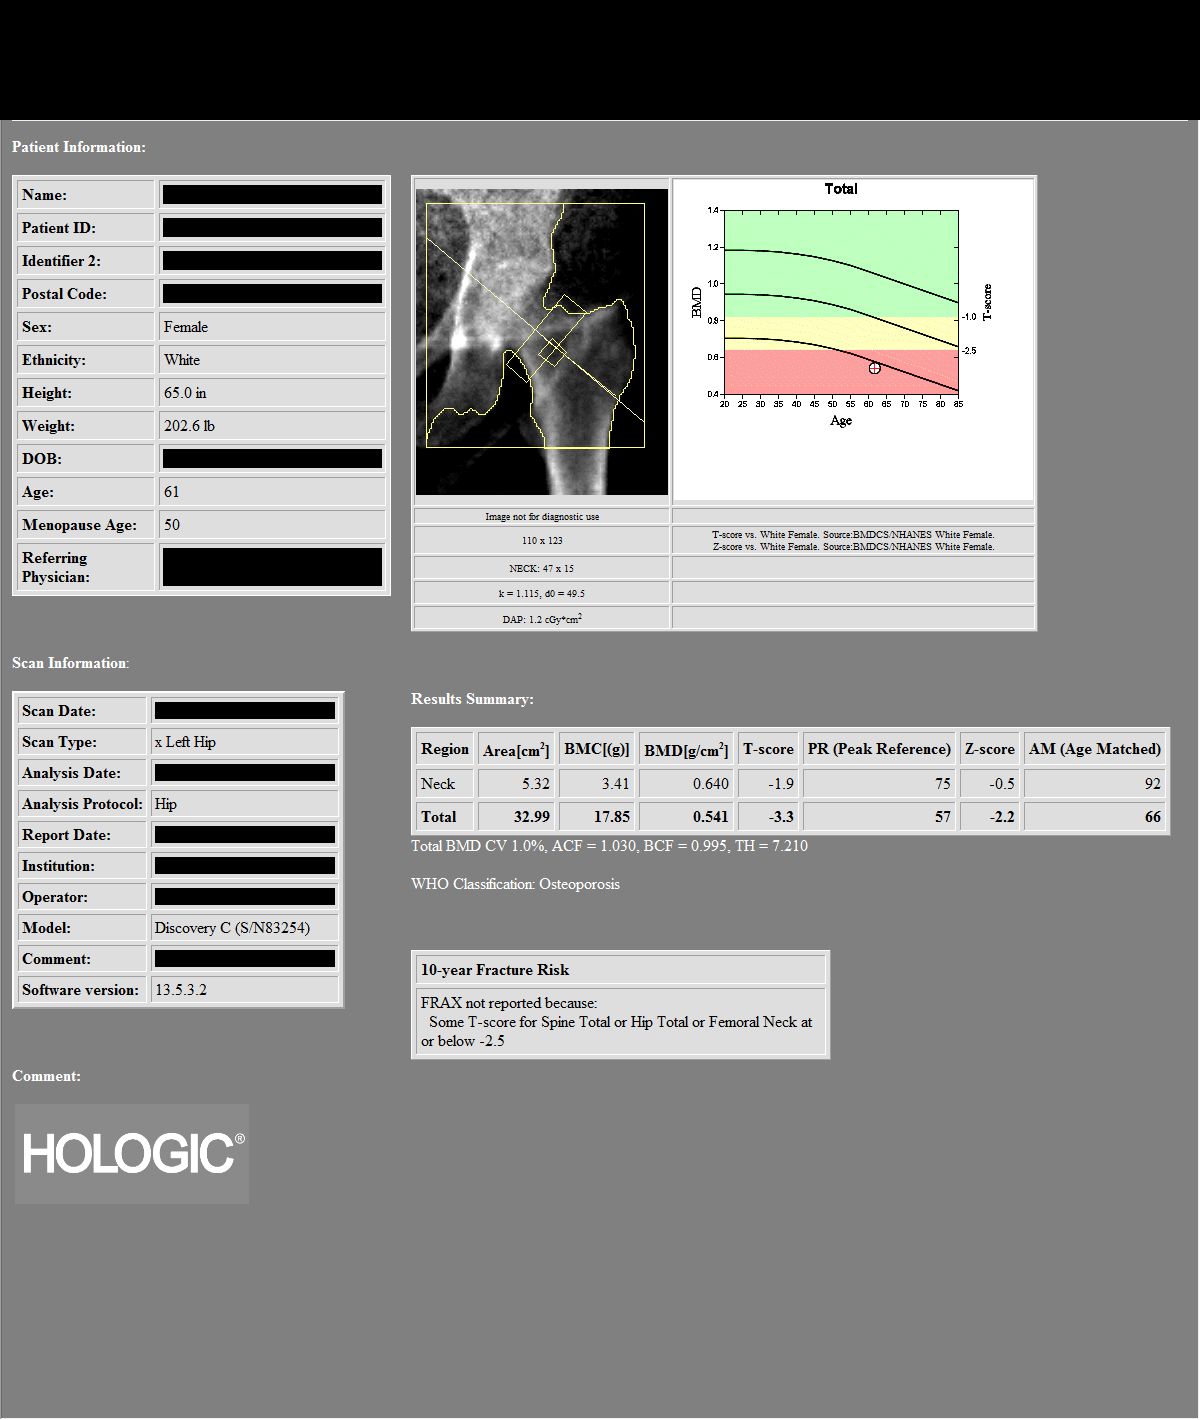

[2 of 2 positions shown; findings below may reference images not displayed]

IMPRESSION: As defined by World Health Organization, the patient meets the criteria for OSTEOPOROSIS based on lumbar spine and left hip T-scores.
PATIENT DEMOGRAPHICS:  61 years old White  Female.
FINDINGS: 1.    Review of scanogram images shows no factor invalidating scan results.  The L4 vertebral body is excluded from analysis.
2.    The lumbar spine exam using L1-L3 regions shows average Bone Mineral Density is 0.709 gm/cm2 of Hydroxyapatite.  The T-score (comparing patient with a young adult group) is 2.8 standard
deviations BELOW mean. The Z-score (comparing patient with an age-matched group) is 1.3 standard deviations BELOW mean.
3.  The left hip exam using total region of interest shows average Bone Mineral Density is 0.541 gm/cm2 of Hydroxyapatite. The T-score (comparing patient with a young adult group) is 3.3 standard
deviations BELOW mean. The Z-score (comparing patient with an age-matched group) is 2.2 standard deviations BELOW mean.
The patient states that she is not taking supplements on a regular basis.  The patient should continue being a nonsmoker and regular exercise to patient tolerance would be of benefit.  The patient is
currently not taking prescribed medication for prevention of bone loss.  According to criteria established by the national osteoporosis foundation, the patient DOES meet the current indications for
prescribed medical therapy.
